# Patient Record
Sex: Female | Born: 1955 | Race: White | Hispanic: No | Marital: Single | Smoking: Current every day smoker
Health system: Southern US, Community
[De-identification: ages and names within clinical notes are randomized; demographics above are authoritative.]

## PROBLEM LIST (undated history)

## (undated) DIAGNOSIS — Z87442 Personal history of urinary calculi: Secondary | ICD-10-CM

## (undated) DIAGNOSIS — E119 Type 2 diabetes mellitus without complications: Secondary | ICD-10-CM

## (undated) DIAGNOSIS — T8859XA Other complications of anesthesia, initial encounter: Secondary | ICD-10-CM

## (undated) DIAGNOSIS — R7303 Prediabetes: Secondary | ICD-10-CM

## (undated) DIAGNOSIS — T4145XA Adverse effect of unspecified anesthetic, initial encounter: Secondary | ICD-10-CM

## (undated) DIAGNOSIS — S82899A Other fracture of unspecified lower leg, initial encounter for closed fracture: Secondary | ICD-10-CM

## (undated) DIAGNOSIS — R519 Headache, unspecified: Secondary | ICD-10-CM

## (undated) DIAGNOSIS — E039 Hypothyroidism, unspecified: Secondary | ICD-10-CM

## (undated) DIAGNOSIS — R51 Headache: Secondary | ICD-10-CM

## (undated) DIAGNOSIS — K5790 Diverticulosis of intestine, part unspecified, without perforation or abscess without bleeding: Secondary | ICD-10-CM

## (undated) HISTORY — PX: TONSILLECTOMY: SUR1361

## (undated) HISTORY — PX: CYST REMOVAL NECK: SHX6281

## (undated) HISTORY — PX: DIAGNOSTIC LAPAROSCOPY: SUR761

---

## 2001-07-12 ENCOUNTER — Emergency Department (HOSPITAL_COMMUNITY): Admission: EM | Admit: 2001-07-12 | Discharge: 2001-07-12 | Payer: Self-pay | Admitting: Emergency Medicine

## 2004-01-14 ENCOUNTER — Ambulatory Visit (HOSPITAL_COMMUNITY): Admission: RE | Admit: 2004-01-14 | Discharge: 2004-01-14 | Payer: Self-pay | Admitting: Family Medicine

## 2008-03-21 ENCOUNTER — Emergency Department (HOSPITAL_COMMUNITY): Admission: EM | Admit: 2008-03-21 | Discharge: 2008-03-21 | Payer: Self-pay | Admitting: Emergency Medicine

## 2010-08-01 ENCOUNTER — Emergency Department (HOSPITAL_COMMUNITY)
Admission: EM | Admit: 2010-08-01 | Discharge: 2010-08-01 | Disposition: A | Discharge status: Home / Self Care | Payer: No Typology Code available for payment source | Attending: Emergency Medicine | Admitting: Emergency Medicine

## 2010-08-01 ENCOUNTER — Emergency Department (HOSPITAL_COMMUNITY): Payer: Self-pay

## 2010-08-01 ENCOUNTER — Emergency Department (HOSPITAL_COMMUNITY): Payer: No Typology Code available for payment source

## 2010-08-01 DIAGNOSIS — IMO0002 Reserved for concepts with insufficient information to code with codable children: Secondary | ICD-10-CM | POA: Insufficient documentation

## 2010-08-01 DIAGNOSIS — M25539 Pain in unspecified wrist: Secondary | ICD-10-CM | POA: Insufficient documentation

## 2010-08-01 DIAGNOSIS — S8000XA Contusion of unspecified knee, initial encounter: Secondary | ICD-10-CM | POA: Insufficient documentation

## 2010-08-01 DIAGNOSIS — M25569 Pain in unspecified knee: Secondary | ICD-10-CM | POA: Insufficient documentation

## 2010-08-01 DIAGNOSIS — S62319A Displaced fracture of base of unspecified metacarpal bone, initial encounter for closed fracture: Secondary | ICD-10-CM | POA: Insufficient documentation

## 2010-08-01 DIAGNOSIS — M79609 Pain in unspecified limb: Secondary | ICD-10-CM | POA: Insufficient documentation

## 2010-08-01 DIAGNOSIS — R51 Headache: Secondary | ICD-10-CM | POA: Insufficient documentation

## 2010-08-23 ENCOUNTER — Emergency Department (HOSPITAL_COMMUNITY): Payer: No Typology Code available for payment source

## 2010-08-23 ENCOUNTER — Emergency Department (HOSPITAL_COMMUNITY)
Admission: EM | Admit: 2010-08-23 | Discharge: 2010-08-23 | Disposition: A | Discharge status: Home / Self Care | Payer: No Typology Code available for payment source | Attending: Emergency Medicine | Admitting: Emergency Medicine

## 2010-08-23 DIAGNOSIS — M79609 Pain in unspecified limb: Secondary | ICD-10-CM | POA: Insufficient documentation

## 2016-02-18 ENCOUNTER — Encounter (HOSPITAL_COMMUNITY): Payer: Self-pay | Admitting: *Deleted

## 2016-02-18 ENCOUNTER — Emergency Department (HOSPITAL_COMMUNITY): Payer: Self-pay

## 2016-02-18 ENCOUNTER — Emergency Department (HOSPITAL_COMMUNITY)
Admission: EM | Admit: 2016-02-18 | Discharge: 2016-02-18 | Disposition: A | Discharge status: Home / Self Care | Payer: Self-pay | Attending: Emergency Medicine | Admitting: Emergency Medicine

## 2016-02-18 DIAGNOSIS — Y9301 Activity, walking, marching and hiking: Secondary | ICD-10-CM | POA: Insufficient documentation

## 2016-02-18 DIAGNOSIS — Y999 Unspecified external cause status: Secondary | ICD-10-CM | POA: Insufficient documentation

## 2016-02-18 DIAGNOSIS — S82899A Other fracture of unspecified lower leg, initial encounter for closed fracture: Secondary | ICD-10-CM

## 2016-02-18 DIAGNOSIS — Y92000 Kitchen of unspecified non-institutional (private) residence as  the place of occurrence of the external cause: Secondary | ICD-10-CM | POA: Insufficient documentation

## 2016-02-18 DIAGNOSIS — S82402A Unspecified fracture of shaft of left fibula, initial encounter for closed fracture: Secondary | ICD-10-CM

## 2016-02-18 DIAGNOSIS — S82432A Displaced oblique fracture of shaft of left fibula, initial encounter for closed fracture: Secondary | ICD-10-CM | POA: Insufficient documentation

## 2016-02-18 DIAGNOSIS — X501XXA Overexertion from prolonged static or awkward postures, initial encounter: Secondary | ICD-10-CM | POA: Insufficient documentation

## 2016-02-18 HISTORY — DX: Personal history of urinary calculi: Z87.442

## 2016-02-18 HISTORY — DX: Other fracture of unspecified lower leg, initial encounter for closed fracture: S82.899A

## 2016-02-18 HISTORY — DX: Diverticulosis of intestine, part unspecified, without perforation or abscess without bleeding: K57.90

## 2016-02-18 HISTORY — DX: Hypothyroidism, unspecified: E03.9

## 2016-02-18 HISTORY — DX: Prediabetes: R73.03

## 2016-02-18 MED ORDER — IBUPROFEN 600 MG PO TABS: 600.0000 mg | ORAL_TABLET | Freq: Four times a day (QID) | ORAL | 0 refills | Status: DC

## 2016-02-18 MED ORDER — HYDROCODONE-ACETAMINOPHEN 7.5-325 MG PO TABS: 1.0000 | ORAL_TABLET | ORAL | 0 refills | Status: DC | PRN

## 2016-02-18 MED ORDER — HYDROCODONE-ACETAMINOPHEN 5-325 MG PO TABS
2.0000 | ORAL_TABLET | Freq: Once | ORAL | Status: AC
  Administered 2016-02-18: 2 via ORAL
  Filled 2016-02-18: qty 2

## 2016-02-18 MED ORDER — ONDANSETRON HCL 4 MG PO TABS
4.0000 mg | ORAL_TABLET | Freq: Once | ORAL | Status: AC
Start: 2016-02-18 — End: 2016-02-18
  Administered 2016-02-18: 4 mg via ORAL
  Filled 2016-02-18: qty 1

## 2016-02-18 NOTE — Discharge Instructions (Signed)
You have a fracture of the fibula on the left. Please keep your foot elevated above your waist. Use your Cam Walker until seen by Dr. Romeo AppleHarrison, or the orthopedic specialist of your choice. Please apply ice when possible. Use your crutches. Please use 600 mg of ibuprofen with breakfast, lunch, dinner, and at bedtime. Please use Norco for more severe pain.This medication may cause drowsiness. Please do not drink, drive, or participate in activity that requires concentration while taking this medication.

## 2016-02-18 NOTE — ED Triage Notes (Signed)
Pt got up from her couch and started walking to the kitchen and then fell down face first onto the floor. Pt denies dizziness, LOC, or hitting her head upon fall. Pt c/o left ankle pain and left knee pain. Pt reports she hears a "popping sound" in her left knee when she ambulates. Pt is able to limp around on left ankle, but with much pain.

## 2016-02-18 NOTE — ED Provider Notes (Signed)
[facility] DEPT Provider Note   CSN: [id number] Arrival date & time: 02/18/16  0911     History   Chief Complaint Chief Complaint  Patient presents with  . Ankle Pain    HPI Maria Leblanc is a 60 y.o. female.  Patient is a 60 year old female who presents to the emergency department with a complaint of knee and ankle pain following a fall.  The patient states that she had gotten up from her couch and walked to her kitchen area. Patient is unsure if she tripped over an object in the floor. She denies dizziness, unusual headache, visual changes,or other problems. She states she fell and injured the left knee and ankle. She has pain with attempting to apply weight. No hx of previous injury or operation to the left knee and ankle.   The history is provided by the patient.  Ankle Pain   The incident occurred 1 to 2 hours ago.    Past Medical History:  Diagnosis Date  . Diverticulosis   . History of kidney stones     There are no active problems to display for this patient.   No past surgical history on file.  OB History    No data available       Home Medications    Prior to Admission medications   Not on File    Family History No family history on file.  Social History Social History  Substance Use Topics  . Smoking status: Not on file  . Smokeless tobacco: Not on file  . Alcohol use Not on file     Allergies   Review of patient's allergies indicates not on file.   Review of Systems Review of Systems  Constitutional: Negative for activity change.       All ROS Neg except as noted in HPI  HENT: Negative for nosebleeds.   Eyes: Negative for photophobia and discharge.  Respiratory: Negative for cough, shortness of breath and wheezing.   Cardiovascular: Negative for chest pain and palpitations.  Gastrointestinal: Negative for abdominal pain and blood in stool.  Genitourinary: Negative for dysuria, frequency and hematuria.  Musculoskeletal:  Positive for arthralgias. Negative for back pain and neck pain.  Skin: Negative.   Neurological: Negative for dizziness, seizures, speech difficulty and light-headedness.  Psychiatric/Behavioral: Negative for confusion and hallucinations.     Physical Exam Updated Vital Signs BP 126/90   Pulse 81   Temp 97.3 F (36.3 C) (Oral)   Resp 18   Ht 5' 5.5" (1.664 m)   Wt 79.8 kg   SpO2 96%   BMI 28.84 kg/m   Physical Exam  Constitutional: Vital signs are normal. She appears well-developed and well-nourished. She is active.  HENT:  Head: Normocephalic and atraumatic.  Right Ear: Tympanic membrane, external ear and ear canal normal.  Left Ear: Tympanic membrane, external ear and ear canal normal.  Nose: Nose normal.  Mouth/Throat: Uvula is midline, oropharynx is clear and moist and mucous membranes are normal.  Eyes: Conjunctivae, EOM and lids are normal. Pupils are equal, round, and reactive to light.  Neck: Trachea normal, normal range of motion and phonation normal. Neck supple. Carotid bruit is not present.  Cardiovascular: Normal rate, regular rhythm and normal pulses.   Abdominal: Soft. Normal appearance and bowel sounds are normal.  Musculoskeletal: She exhibits tenderness.  There is full ROm of right and left hips. No shortening of the lower extremities. Pain with attempted ROM of the left knee, especially at the anterior-medial  aspect of the knee. There is pain to palpation of the lateral malleolus of the left ankle. DP pulse 2+.  Lymphadenopathy:       Head (right side): No submental, no preauricular and no posterior auricular adenopathy present.       Head (left side): No submental, no preauricular and no posterior auricular adenopathy present.    She has no cervical adenopathy.  Neurological: She is alert. She has normal strength. No cranial nerve deficit or sensory deficit. GCS eye subscore is 4. GCS verbal subscore is 5. GCS motor subscore is 6.  Skin: Skin is warm and  dry.  Psychiatric: Her speech is normal.     ED Treatments / Results  Labs (all labs ordered are listed, but only abnormal results are displayed) Labs Reviewed - No data to display  EKG  EKG Interpretation None       Radiology No results found.  Procedures Procedures (including critical care time) FRACTURE CARE LEFT ANKLE. Patient fell earlier today and sustained a fracture of the left fibula. I discussed and demonstrated the fracture with the patient in terms and pictures she would understand. I discussed the procedure with patient. The patient gives permission for the application of a Cam Walker.  Patient identified by arm band. Patient fitted with a Cam Walker and crutches. Patient tolerated the procedure without problem. Use of crutches was demonstrated to the patient by the nursing staff. No neurovascular deficits following the application of the Cam Walker. Medications Ordered in ED Medications  HYDROcodone-acetaminophen (NORCO/VICODIN) 5-325 MG per tablet 2 tablet (not administered)  ondansetron (ZOFRAN) tablet 4 mg (not administered)     Initial Impression / Assessment and Plan / ED Course  I have reviewed the triage vital signs and the nursing notes.  Pertinent labs & imaging results that were available during my care of the patient were reviewed by me and considered in my medical decision making (see chart for details).  Clinical Course    **I have reviewed nursing notes, vital signs, and all appropriate lab and imaging results for this patient.*  Final Clinical Impressions(s) / ED Diagnoses  Patient sustained a fracture of the left fibula. Patient fitted with a Cam Walker. X-ray of the left knee reveals arthritis changes. I discussed this with the patient. The patient will be placed on ibuprofen 600 mg 4 times a day, and Norco for more severe pain. Patient will follow-up with Dr. Romeo AppleHarrison concerning her fracture.    Final diagnoses:  None    New  Prescriptions New Prescriptions   No medications on file     Ivery QualeHobson Syon Tews, PA-C 02/18/16 1129    Glynn OctaveStephen Rancour, MD 02/18/16 [phone]

## 2016-02-21 ENCOUNTER — Encounter (HOSPITAL_BASED_OUTPATIENT_CLINIC_OR_DEPARTMENT_OTHER): Payer: Self-pay | Admitting: *Deleted

## 2016-02-24 ENCOUNTER — Encounter (HOSPITAL_BASED_OUTPATIENT_CLINIC_OR_DEPARTMENT_OTHER): Payer: Self-pay | Admitting: *Deleted

## 2016-02-24 NOTE — H&P (Signed)
[facility] [address]   [address] [place], [place] [id number] [phone] A Division of [facility]  RE: Maria DuffMARTIN, Glennis   [phone]   10/08/2055  02-19-16 SUBJECTIVE: [name] is a 60 year-old female, new patient to me who comes to the office with concerns about her left ankle and left knee.  She fell yesterday, falling forward, striking her knee on the ground and twisting her left ankle.  She was seen in the emergency room [facility].  X-rays of the knee were unremarkable.  She was found to have a fibular fracture.  Given crutches and a CAM walker.  She comes in today for follow up.  No previous problems with her knee or ankle.  Her health upon review shows she has no allergies.  No history of diabetes.  Unremarkable past medical history.  On no chronic medications.  She has recently lost weight on a diet and subsequently is no longer on antihypertensive medication with a history of underlying hypertension.  No history of coronary artery disease or peripheral vascular disease.  Unremarkable past surgical history.  Family history is significant for diabetes and hypertension.  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    OBJECTIVE: Height: 5?5.  Weight: 186 pounds.  Difficult time bearing weight to the left lower extremity with significant pain.  Exam of the left knee shows global peripatellar tenderness with no effusion.  No abrasion.  Stable knee.  Exam of the left ankle shows global soft tissue swelling with tenderness over the distal fibula.  Palpable crepitation.  Achilles is intact.  She is neurovascularly intact distally.    X-RAYS: Plain films of the knee, upon review, are unremarkable.  Three view plain radiographs of the ankle, upon review, show a displaced distal fibula fracture.   ASSESSMENT: 1. Left distal fibula fracture. 2. Left knee contusion.   PLAN: Lengthy discussion with [name]  as to the nature and gravity of her left ankle injury.  Discussed the risks, benefits, potential complications, post-op care, recovery and rehab all discussed at length with regards to ORIF of the left ankle to restore anatomic alignment.  She agrees and understands.  Continue in the CAM walker non-weight bearing.  Intermittent Hydrocodone for pain, as prescribed in the emergency room.  She is out of work for 8-12 weeks.  She understands.  Surgical consultation with Dr. Renaye Rakersim Murphy today.  Post-op follow up with Dr. Eulah PontMurphy.    Estell HarpinJames S. Kramer, M.D.  Electronically verified by Estell HarpinJames S. Kramer, M.D. JSK:jjh D 02-19-16 T 02-20-16

## 2016-02-28 ENCOUNTER — Ambulatory Visit (HOSPITAL_BASED_OUTPATIENT_CLINIC_OR_DEPARTMENT_OTHER): Payer: Self-pay | Admitting: Anesthesiology

## 2016-02-28 ENCOUNTER — Encounter (HOSPITAL_BASED_OUTPATIENT_CLINIC_OR_DEPARTMENT_OTHER)
Admission: RE | Disposition: A | Discharge status: Home / Self Care | Payer: Self-pay | Source: Ambulatory Visit | Attending: Orthopedic Surgery

## 2016-02-28 ENCOUNTER — Encounter (HOSPITAL_BASED_OUTPATIENT_CLINIC_OR_DEPARTMENT_OTHER): Payer: Self-pay | Admitting: Anesthesiology

## 2016-02-28 ENCOUNTER — Ambulatory Visit (HOSPITAL_BASED_OUTPATIENT_CLINIC_OR_DEPARTMENT_OTHER)
Admission: RE | Admit: 2016-02-28 | Discharge: 2016-02-28 | Disposition: A | Discharge status: Home / Self Care | Payer: Self-pay | Source: Ambulatory Visit | Attending: Orthopedic Surgery | Admitting: Orthopedic Surgery

## 2016-02-28 DIAGNOSIS — X501XXA Overexertion from prolonged static or awkward postures, initial encounter: Secondary | ICD-10-CM | POA: Insufficient documentation

## 2016-02-28 DIAGNOSIS — Y9289 Other specified places as the place of occurrence of the external cause: Secondary | ICD-10-CM | POA: Insufficient documentation

## 2016-02-28 DIAGNOSIS — Y998 Other external cause status: Secondary | ICD-10-CM | POA: Insufficient documentation

## 2016-02-28 DIAGNOSIS — S82832A Other fracture of upper and lower end of left fibula, initial encounter for closed fracture: Secondary | ICD-10-CM | POA: Insufficient documentation

## 2016-02-28 DIAGNOSIS — S8002XA Contusion of left knee, initial encounter: Secondary | ICD-10-CM | POA: Insufficient documentation

## 2016-02-28 DIAGNOSIS — Y9301 Activity, walking, marching and hiking: Secondary | ICD-10-CM | POA: Insufficient documentation

## 2016-02-28 DIAGNOSIS — F17200 Nicotine dependence, unspecified, uncomplicated: Secondary | ICD-10-CM | POA: Insufficient documentation

## 2016-02-28 DIAGNOSIS — E039 Hypothyroidism, unspecified: Secondary | ICD-10-CM | POA: Insufficient documentation

## 2016-02-28 HISTORY — PX: ORIF ANKLE FRACTURE: SHX5408

## 2016-02-28 HISTORY — DX: Other fracture of unspecified lower leg, initial encounter for closed fracture: S82.899A

## 2016-02-28 HISTORY — DX: Headache: R51

## 2016-02-28 HISTORY — DX: Headache, unspecified: R51.9

## 2016-02-28 SURGERY — OPEN REDUCTION INTERNAL FIXATION (ORIF) ANKLE FRACTURE
Anesthesia: Regional | Site: Ankle | Laterality: Left

## 2016-02-28 MED ORDER — LIDOCAINE 2% (20 MG/ML) 5 ML SYRINGE
INTRAMUSCULAR | Status: DC | PRN
  Administered 2016-02-28: 50 mg via INTRAVENOUS

## 2016-02-28 MED ORDER — LACTATED RINGERS IV SOLN: INTRAVENOUS | Status: DC

## 2016-02-28 MED ORDER — OMEPRAZOLE 20 MG PO CPDR: 20.0000 mg | DELAYED_RELEASE_CAPSULE | Freq: Every day | ORAL | 2 refills | Status: DC

## 2016-02-28 MED ORDER — LACTATED RINGERS IV SOLN
INTRAVENOUS | Status: DC
Start: 2016-02-28 — End: 2016-02-28
  Administered 2016-02-28: 14:00:00 via INTRAVENOUS

## 2016-02-28 MED ORDER — FENTANYL CITRATE (PF) 100 MCG/2ML IJ SOLN
INTRAMUSCULAR | Status: AC
  Filled 2016-02-28: qty 2

## 2016-02-28 MED ORDER — ONDANSETRON HCL 4 MG/2ML IJ SOLN
INTRAMUSCULAR | Status: DC | PRN
  Administered 2016-02-28: 4 mg via INTRAVENOUS

## 2016-02-28 MED ORDER — GLYCOPYRROLATE 0.2 MG/ML IJ SOLN: 0.2000 mg | Freq: Once | INTRAMUSCULAR | Status: DC | PRN

## 2016-02-28 MED ORDER — ONDANSETRON HCL 4 MG/2ML IJ SOLN
INTRAMUSCULAR | Status: AC
  Filled 2016-02-28: qty 2

## 2016-02-28 MED ORDER — ACETAMINOPHEN 500 MG PO TABS
ORAL_TABLET | ORAL | Status: AC
Start: 2016-02-28 — End: 2016-02-28
  Filled 2016-02-28: qty 2

## 2016-02-28 MED ORDER — SCOPOLAMINE 1 MG/3DAYS TD PT72: 1.0000 | MEDICATED_PATCH | Freq: Once | TRANSDERMAL | Status: DC | PRN

## 2016-02-28 MED ORDER — DEXAMETHASONE SODIUM PHOSPHATE 10 MG/ML IJ SOLN
INTRAMUSCULAR | Status: AC
  Filled 2016-02-28: qty 1

## 2016-02-28 MED ORDER — OXYCODONE-ACETAMINOPHEN 5-325 MG PO TABS: 1.0000 | ORAL_TABLET | ORAL | 0 refills | Status: DC | PRN

## 2016-02-28 MED ORDER — PROPOFOL 10 MG/ML IV BOLUS
INTRAVENOUS | Status: DC | PRN
  Administered 2016-02-28: 150 mg via INTRAVENOUS

## 2016-02-28 MED ORDER — ONDANSETRON HCL 4 MG PO TABS: 4.0000 mg | ORAL_TABLET | Freq: Three times a day (TID) | ORAL | 0 refills | Status: DC | PRN

## 2016-02-28 MED ORDER — MIDAZOLAM HCL 2 MG/2ML IJ SOLN
1.0000 mg | INTRAMUSCULAR | Status: DC | PRN
  Administered 2016-02-28: 2 mg via INTRAVENOUS

## 2016-02-28 MED ORDER — BUPIVACAINE-EPINEPHRINE (PF) 0.5% -1:200000 IJ SOLN
INTRAMUSCULAR | Status: DC | PRN
  Administered 2016-02-28: 30 mL via PERINEURAL

## 2016-02-28 MED ORDER — FENTANYL CITRATE (PF) 100 MCG/2ML IJ SOLN
50.0000 ug | INTRAMUSCULAR | Status: DC | PRN
  Administered 2016-02-28: 100 ug via INTRAVENOUS
  Administered 2016-02-28: 25 ug via INTRAVENOUS

## 2016-02-28 MED ORDER — CEFAZOLIN SODIUM-DEXTROSE 2-4 GM/100ML-% IV SOLN
INTRAVENOUS | Status: AC
Start: 2016-02-28 — End: 2016-02-28
  Filled 2016-02-28: qty 100

## 2016-02-28 MED ORDER — DOCUSATE SODIUM 100 MG PO CAPS: 100.0000 mg | ORAL_CAPSULE | Freq: Two times a day (BID) | ORAL | 0 refills | Status: DC

## 2016-02-28 MED ORDER — METHOCARBAMOL 500 MG PO TABS: 500.0000 mg | ORAL_TABLET | Freq: Four times a day (QID) | ORAL | 0 refills | Status: DC | PRN

## 2016-02-28 MED ORDER — BUPIVACAINE HCL (PF) 0.5 % IJ SOLN
INTRAMUSCULAR | Status: DC | PRN
  Administered 2016-02-28: 10 mL

## 2016-02-28 MED ORDER — CEFAZOLIN SODIUM-DEXTROSE 2-4 GM/100ML-% IV SOLN
2.0000 g | INTRAVENOUS | Status: AC
  Administered 2016-02-28: 2 g via INTRAVENOUS

## 2016-02-28 MED ORDER — DEXAMETHASONE SODIUM PHOSPHATE 4 MG/ML IJ SOLN
INTRAMUSCULAR | Status: DC | PRN
  Administered 2016-02-28: 10 mg via INTRAVENOUS

## 2016-02-28 MED ORDER — ASPIRIN EC 81 MG PO TBEC: 81.0000 mg | DELAYED_RELEASE_TABLET | Freq: Every day | ORAL | 0 refills | Status: DC

## 2016-02-28 MED ORDER — ACETAMINOPHEN 500 MG PO TABS
1000.0000 mg | ORAL_TABLET | Freq: Once | ORAL | Status: AC
  Administered 2016-02-28: 1000 mg via ORAL

## 2016-02-28 MED ORDER — HYDROMORPHONE HCL 1 MG/ML IJ SOLN: 0.2500 mg | INTRAMUSCULAR | Status: DC | PRN

## 2016-02-28 MED ORDER — LIDOCAINE 2% (20 MG/ML) 5 ML SYRINGE
INTRAMUSCULAR | Status: AC
  Filled 2016-02-28: qty 5

## 2016-02-28 MED ORDER — CHLORHEXIDINE GLUCONATE 4 % EX LIQD: 60.0000 mL | Freq: Once | CUTANEOUS | Status: DC

## 2016-02-28 MED ORDER — MIDAZOLAM HCL 2 MG/2ML IJ SOLN
INTRAMUSCULAR | Status: AC
  Filled 2016-02-28: qty 2

## 2016-02-28 SURGICAL SUPPLY — 81 items
APL SKNCLS STERI-STRIP NONHPOA (GAUZE/BANDAGES/DRESSINGS) ×1
BANDAGE ACE 4X5 VEL STRL LF (GAUZE/BANDAGES/DRESSINGS) ×3 IMPLANT
BANDAGE ACE 6X5 VEL STRL LF (GAUZE/BANDAGES/DRESSINGS) ×3 IMPLANT
BANDAGE ESMARK 6X9 LF (GAUZE/BANDAGES/DRESSINGS) ×1 IMPLANT
BENZOIN TINCTURE PRP APPL 2/3 (GAUZE/BANDAGES/DRESSINGS) ×2 IMPLANT
BIT DRILL 2.5X125 (BIT) ×2 IMPLANT
BIT DRILL 3.5X125 (BIT) IMPLANT
BIT DRILL COUNTER SINK (DRILL) IMPLANT
BLADE SURG 15 STRL LF DISP TIS (BLADE) ×2 IMPLANT
BLADE SURG 15 STRL SS (BLADE) ×6
BNDG CMPR 9X6 STRL LF SNTH (GAUZE/BANDAGES/DRESSINGS) ×1
BNDG COHESIVE 4X5 TAN STRL (GAUZE/BANDAGES/DRESSINGS) ×3 IMPLANT
BNDG ESMARK 6X9 LF (GAUZE/BANDAGES/DRESSINGS) ×3
CHLORAPREP W/TINT 26ML (MISCELLANEOUS) ×3 IMPLANT
CLOSURE STERI-STRIP 1/2X4 (GAUZE/BANDAGES/DRESSINGS) ×1
CLSR STERI-STRIP ANTIMIC 1/2X4 (GAUZE/BANDAGES/DRESSINGS) ×2 IMPLANT
COVER BACK TABLE 60X90IN (DRAPES) ×3 IMPLANT
CUFF TOURNIQUET SINGLE 24IN (TOURNIQUET CUFF) IMPLANT
CUFF TOURNIQUET SINGLE 34IN LL (TOURNIQUET CUFF) ×2 IMPLANT
DECANTER SPIKE VIAL GLASS SM (MISCELLANEOUS) IMPLANT
DRAPE EXTREMITY T 121X128X90 (DRAPE) ×3 IMPLANT
DRAPE IMP U-DRAPE 54X76 (DRAPES) ×3 IMPLANT
DRAPE OEC MINIVIEW 54X84 (DRAPES) ×3 IMPLANT
DRAPE U-SHAPE 47X51 STRL (DRAPES) ×3 IMPLANT
DRILL BIT 3.5X125 (BIT) ×3
DRILL COUNTER SINK (DRILL) ×3
DRSG EMULSION OIL 3X3 NADH (GAUZE/BANDAGES/DRESSINGS) ×3 IMPLANT
DRSG PAD ABDOMINAL 8X10 ST (GAUZE/BANDAGES/DRESSINGS) ×3 IMPLANT
ELECT REM PT RETURN 9FT ADLT (ELECTROSURGICAL) ×3
ELECTRODE REM PT RTRN 9FT ADLT (ELECTROSURGICAL) ×1 IMPLANT
GAUZE SPONGE 4X4 12PLY STRL (GAUZE/BANDAGES/DRESSINGS) ×3 IMPLANT
GLOVE BIO SURGEON STRL SZ 6.5 (GLOVE) ×1 IMPLANT
GLOVE BIO SURGEON STRL SZ7.5 (GLOVE) ×6 IMPLANT
GLOVE BIO SURGEONS STRL SZ 6.5 (GLOVE) ×1
GLOVE BIOGEL PI IND STRL 7.0 (GLOVE) IMPLANT
GLOVE BIOGEL PI IND STRL 8 (GLOVE) ×2 IMPLANT
GLOVE BIOGEL PI INDICATOR 7.0 (GLOVE) ×6
GLOVE BIOGEL PI INDICATOR 8 (GLOVE) ×4
GOWN STRL REUS W/ TWL LRG LVL3 (GOWN DISPOSABLE) ×2 IMPLANT
GOWN STRL REUS W/ TWL XL LVL3 (GOWN DISPOSABLE) ×1 IMPLANT
GOWN STRL REUS W/TWL LRG LVL3 (GOWN DISPOSABLE) ×6
GOWN STRL REUS W/TWL XL LVL3 (GOWN DISPOSABLE) ×3
NEEDLE HYPO 22GX1.5 SAFETY (NEEDLE) IMPLANT
NS IRRIG 1000ML POUR BTL (IV SOLUTION) ×3 IMPLANT
PACK BASIN DAY SURGERY FS (CUSTOM PROCEDURE TRAY) ×3 IMPLANT
PAD CAST 4YDX4 CTTN HI CHSV (CAST SUPPLIES) ×1 IMPLANT
PADDING CAST ABS 4INX4YD NS (CAST SUPPLIES) ×4
PADDING CAST ABS COTTON 4X4 ST (CAST SUPPLIES) ×2 IMPLANT
PADDING CAST COTTON 4X4 STRL (CAST SUPPLIES) ×3
PADDING CAST COTTON 6X4 STRL (CAST SUPPLIES) ×3 IMPLANT
PENCIL BUTTON HOLSTER BLD 10FT (ELECTRODE) ×3 IMPLANT
PLATE 1/3 TUBULAR 7H (Plate) ×2 IMPLANT
SCREW CANC 2.5XFT HEX12X4X (Screw) IMPLANT
SCREW CANC FT 16X4X2.5XHEX (Screw) IMPLANT
SCREW CANCELLOUR THREAD 45X4.0 (Screw) ×2 IMPLANT
SCREW CANCELLOUS 4.0X12MM (Screw) ×3 IMPLANT
SCREW CANCELLOUS 4.0X16MM (Screw) ×3 IMPLANT
SCREW CORTEX ST MATTA 3.5X12MM (Screw) ×6 IMPLANT
SCREW CORTEX ST MATTA 3.5X16MM (Screw) ×2 IMPLANT
SLEEVE SCD COMPRESS KNEE MED (MISCELLANEOUS) IMPLANT
SPLINT FAST PLASTER 5X30 (CAST SUPPLIES) ×2
SPLINT PLASTER CAST FAST 5X30 (CAST SUPPLIES) ×20 IMPLANT
SPONGE LAP 4X18 X RAY DECT (DISPOSABLE) ×3 IMPLANT
SUCTION FRAZIER HANDLE 10FR (MISCELLANEOUS) ×2
SUCTION TUBE FRAZIER 10FR DISP (MISCELLANEOUS) ×1 IMPLANT
SUT ETHILON 3 0 PS 1 (SUTURE) IMPLANT
SUT MNCRL AB 4-0 PS2 18 (SUTURE) IMPLANT
SUT MON AB 2-0 CT1 36 (SUTURE) IMPLANT
SUT MON AB 3-0 SH 27 (SUTURE)
SUT MON AB 3-0 SH27 (SUTURE) IMPLANT
SUT VIC AB 0 SH 27 (SUTURE) ×3 IMPLANT
SUT VIC AB 2-0 SH 27 (SUTURE)
SUT VIC AB 2-0 SH 27XBRD (SUTURE) IMPLANT
SYR BULB 3OZ (MISCELLANEOUS) ×3 IMPLANT
SYR CONTROL 10ML LL (SYRINGE) IMPLANT
TAPE STRIPS DRAPE STRL (GAUZE/BANDAGES/DRESSINGS) ×2 IMPLANT
TOWEL OR 17X24 6PK STRL BLUE (TOWEL DISPOSABLE) ×6 IMPLANT
TOWEL OR NON WOVEN STRL DISP B (DISPOSABLE) ×3 IMPLANT
TUBE CONNECTING 20'X1/4 (TUBING) ×1
TUBE CONNECTING 20X1/4 (TUBING) ×2 IMPLANT
UNDERPAD 30X30 (UNDERPADS AND DIAPERS) ×3 IMPLANT

## 2016-02-28 NOTE — Anesthesia Postprocedure Evaluation (Signed)
Anesthesia Post Note  Patient: Ray ChurchSarah J Fletchall  Procedure(s) Performed: Procedure(s) (LRB): OPEN REDUCTION INTERNAL FIXATION (ORIF) ANKLE FRACTURE, LEFT (Left)  Patient location during evaluation: PACU Anesthesia Type: General and Regional Level of consciousness: awake and alert Pain management: pain level controlled Vital Signs Assessment: post-procedure vital signs reviewed and stable Respiratory status: spontaneous breathing, nonlabored ventilation and respiratory function stable Cardiovascular status: blood pressure returned to baseline and stable Postop Assessment: no signs of nausea or vomiting Anesthetic complications: no    Last Vitals:  Vitals:   02/28/16 1615 02/28/16 1630  BP: 133/80 123/73  Pulse: 76 75  Resp: 16 18  Temp:      Last Pain:  Vitals:   02/28/16 1630  TempSrc:   PainSc: 0-No pain    LLE Motor Response: No movement due to regional block (02/28/16 1630) LLE Sensation: Numbness (02/28/16 1630)          Tarrin Menn,W. EDMOND

## 2016-02-28 NOTE — Discharge Instructions (Signed)
Elevate leg as much as possible to reduce pain and swelling.  Diet: As you were doing prior to hospitalization   Shower:  You have a splint on, leave the splint in place and keep the splint dry with a plastic bag.  Dressing:  You have a splint, just leave the splint in place and we will change your bandages during your first follow-up appointment.    Activity:  Increase activity slowly as tolerated, but follow the weight bearing instructions below.  The rules on driving is that you can not be taking narcotics while you drive, and you must feel in control of the vehicle.    Weight Bearing:  Non weight bearing left leg   To prevent constipation: you may use a stool softener such as -  Colace (over the counter) 100 mg by mouth twice a day  Drink plenty of fluids (prune juice may be helpful) and high fiber foods Miralax (over the counter) for constipation as needed.    Itching:  If you experience itching with your medications, try taking only a single pain pill, or even half a pain pill at a time.  You may take up to 10 pain pills per day, and you can also use benadryl over the counter for itching or also to help with sleep.   Precautions:  If you experience chest pain or shortness of breath - call 911 immediately for transfer to the hospital emergency department!!  If you develop a fever greater that 101 F, purulent drainage from wound, increased redness or drainage from wound, or calf pain -- Call the office at [phone]                                                Follow- Up Appointment:  Please call for an appointment to be seen in 2 weeks [place] - [phone]     Post Anesthesia Home Care Instructions  Activity: Get plenty of rest for the remainder of the day. A responsible adult should stay with you for 24 hours following the procedure.  For the next 24 hours, DO NOT: -Drive a car -Advertising copywriterperate machinery -Drink alcoholic beverages -Take any medication unless instructed by  your physician -Make any legal decisions or sign important papers.  Meals: Start with liquid foods such as gelatin or soup. Progress to regular foods as tolerated. Avoid greasy, spicy, heavy foods. If nausea and/or vomiting occur, drink only clear liquids until the nausea and/or vomiting subsides. Call your physician if vomiting continues.  Special Instructions/Symptoms: Your throat may feel dry or sore from the anesthesia or the breathing tube placed in your throat during surgery. If this causes discomfort, gargle with warm salt water. The discomfort should disappear within 24 hours.  If you had a scopolamine patch placed behind your ear for the management of post- operative nausea and/or vomiting:  1. The medication in the patch is effective for 72 hours, after which it should be removed.  Wrap patch in a tissue and discard in the trash. Wash hands thoroughly with soap and water. 2. You may remove the patch earlier than 72 hours if you experience unpleasant side effects which may include dry mouth, dizziness or visual disturbances. 3. Avoid touching the patch. Wash your hands with soap and water after contact with the patch.   Regional Anesthesia Blocks  1. Numbness or the  inability to move the "blocked" extremity may last from 3-48 hours after placement. The length of time depends on the medication injected and your individual response to the medication. If the numbness is not going away after 48 hours, call your surgeon.  2. The extremity that is blocked will need to be protected until the numbness is gone and the  Strength has returned. Because you cannot feel it, you will need to take extra care to avoid injury. Because it may be weak, you may have difficulty moving it or using it. You may not know what position it is in without looking at it while the block is in effect.  3. For blocks in the legs and feet, returning to weight bearing and walking needs to be done carefully. You will need  to wait until the numbness is entirely gone and the strength has returned. You should be able to move your leg and foot normally before you try and bear weight or walk. You will need someone to be with you when you first try to ensure you do not fall and possibly risk injury.  4. Bruising and tenderness at the needle site are common side effects and will resolve in a few days.  5. Persistent numbness or new problems with movement should be communicated to the surgeon or the [facility] [phone] [facility], [facility] [phone]).   Post Anesthesia Home Care Instructions  Activity: Get plenty of rest for the remainder of the day. A responsible adult should stay with you for 24 hours following the procedure.  For the next 24 hours, DO NOT: -Drive a car -Advertising copywriter -Drink alcoholic beverages -Take any medication unless instructed by your physician -Make any legal decisions or sign important papers.  Meals: Start with liquid foods such as gelatin or soup. Progress to regular foods as tolerated. Avoid greasy, spicy, heavy foods. If nausea and/or vomiting occur, drink only clear liquids until the nausea and/or vomiting subsides. Call your physician if vomiting continues.  Special Instructions/Symptoms: Your throat may feel dry or sore from the anesthesia or the breathing tube placed in your throat during surgery. If this causes discomfort, gargle with warm salt water. The discomfort should disappear within 24 hours.  If you had a scopolamine patch placed behind your ear for the management of post- operative nausea and/or vomiting:  1. The medication in the patch is effective for 72 hours, after which it should be removed.  Wrap patch in a tissue and discard in the trash. Wash hands thoroughly with soap and water. 2. You may remove the patch earlier than 72 hours if you experience unpleasant side effects which may include dry mouth, dizziness or visual  disturbances. 3. Avoid touching the patch. Wash your hands with soap and water after contact with the patch.   Regional Anesthesia Blocks  1. Numbness or the inability to move the "blocked" extremity may last from 3-48 hours after placement. The length of time depends on the medication injected and your individual response to the medication. If the numbness is not going away after 48 hours, call your surgeon.  2. The extremity that is blocked will need to be protected until the numbness is gone and the  Strength has returned. Because you cannot feel it, you will need to take extra care to avoid injury. Because it may be weak, you may have difficulty moving it or using it. You may not know what position it is in without looking at it  while the block is in effect.  3. For blocks in the legs and feet, returning to weight bearing and walking needs to be done carefully. You will need to wait until the numbness is entirely gone and the strength has returned. You should be able to move your leg and foot normally before you try and bear weight or walk. You will need someone to be with you when you first try to ensure you do not fall and possibly risk injury.  4. Bruising and tenderness at the needle site are common side effects and will resolve in a few days.  5. Persistent numbness or new problems with movement should be communicated to the surgeon or the [facility] [phone])/ [facility] [phone]).

## 2016-02-28 NOTE — Op Note (Signed)
02/28/2016  3:16 PM  PATIENT:  Maria ChurchSarah J Leblanc    PRE-OPERATIVE DIAGNOSIS:  fracture left ankle  POST-OPERATIVE DIAGNOSIS:  Same  PROCEDURE:  OPEN REDUCTION INTERNAL FIXATION (ORIF) ANKLE FRACTURE, left  SURGEON:  Paulanthony Gleaves, Jewel BaizeIMOTHY D, MD  ASSISTANT: Aquilla HackerHenry Martensen, PA-C, he was present and scrubbed throughout the case, critical for completion in a timely fashion, and for retraction, instrumentation, and closure.   ANESTHESIA:   gen  PREOPERATIVE INDICATIONS:  Maria ChurchSarah J Leblanc is a  60 y.o. female with a diagnosis of fracture left ankle who failed conservative measures and elected for surgical management.    The risks benefits and alternatives were discussed with the patient preoperatively including but not limited to the risks of infection, bleeding, nerve injury, cardiopulmonary complications, the need for revision surgery, among others, and the patient was willing to proceed.  OPERATIVE IMPLANTS: stryker 1/3 tubular plate  OPERATIVE FINDINGS: Unstable ankle fracture. Stable syndesmosis post op  BLOOD LOSS: min  COMPLICATIONS: none  TOURNIQUET TIME: 30min  OPERATIVE PROCEDURE:  Patient was identified in the preoperative holding area and site was marked by me He was transported to the operating theater and placed on the table in supine position taking care to pad all bony prominences. After a preincinduction time out anesthesia was induced. The left lower lower extremity was prepped and draped in normal sterile fashion and a pre-incision timeout was performed. Maria ChurchSarah J Leblanc received ancef for preoperative antibiotics.   I made a lateral incision of roughly 7 cm dissection was carried down sharply to the distal fibula and then spreading dissection was used proximally to protect the superficial peroneal nerve. I sharply incised the periosteum and took care to protect the peroneal tendons. I then debrided the fracture site and performed a reduction maneuver which was held in place with a  clamp.   I placed a lag screw across the fracture  I then selected a 7-hole one third tubular plate and placed in a neutralization fashion care was taken distally so as not to penetrate the joint with the cancellus screws.  I then stressed the syndesmosis and it was unstable,  for syndesmotic fixation I performed a reduction maneuver with a clamp and placed a screw  The wound was then thoroughly irrigated and closed using a 0 Vicryl and absorbable Monocryl sutures. He was placed in a short leg splint.   POST OPERATIVE PLAN: Non-weightbearing. DVT prophylaxis will consist of Mobilization

## 2016-02-28 NOTE — Anesthesia Procedure Notes (Signed)
Procedure Name: LMA Insertion Performed by: Austan Nicholl W Pre-anesthesia Checklist: Patient identified, Emergency Drugs available, Suction available and Patient being monitored Patient Re-evaluated:Patient Re-evaluated prior to inductionOxygen Delivery Method: Circle system utilized Preoxygenation: Pre-oxygenation with 100% oxygen Intubation Type: IV induction Ventilation: Mask ventilation without difficulty LMA: LMA inserted LMA Size: 4.0 Number of attempts: 1 Placement Confirmation: positive ETCO2 Tube secured with: Tape Dental Injury: Teeth and Oropharynx as per pre-operative assessment        

## 2016-02-28 NOTE — Anesthesia Procedure Notes (Addendum)
Anesthesia Regional Block:  Popliteal block  Pre-Anesthetic Checklist: ,, timeout performed, Correct Patient, Correct Site, Correct Laterality, Correct Procedure, Correct Position, site marked, Risks and benefits discussed, pre-op evaluation,  At surgeon's request and post-op pain management  Laterality: Left  Prep: Maximum Sterile Barrier Precautions used, chloraprep       Needles:  Injection technique: Single-shot  Needle Type: Echogenic Stimulator Needle     Needle Length: 9cm 9 cm Needle Gauge: 21 and 21 G    Additional Needles:  Procedures: ultrasound guided (picture in chart) and nerve stimulator Popliteal block  Nerve Stimulator or Paresthesia:  Response: Peroneal,  Response: Tibial,   Additional Responses:   Narrative:  Start time: 02/28/2016 2:05 PM End time: 02/28/2016 2:15 PM Injection made incrementally with aspirations every 5 mL. Anesthesiologist: Gaynelle AduFITZGERALD, Ambriel Gorelick  Additional Notes: 2% Lidocaine skin wheel. Saphenous block with 10cc of 0.5% Bupivicaine plain.

## 2016-02-28 NOTE — Anesthesia Preprocedure Evaluation (Addendum)
Anesthesia Evaluation  Patient identified by MRN, date of birth, ID band Patient awake    Reviewed: Allergy & Precautions, H&P , NPO status , Patient's Chart, lab work & pertinent test results  Airway Mallampati: II  TM Distance: >3 FB Neck ROM: Full    Dental no notable dental hx. (+) Teeth Intact, Dental Advisory Given   Pulmonary Current Smoker,    Pulmonary exam normal breath sounds clear to auscultation       Cardiovascular negative cardio ROS   Rhythm:Regular Rate:Normal     Neuro/Psych  Headaches, negative psych ROS   GI/Hepatic negative GI ROS, Neg liver ROS,   Endo/Other  Hypothyroidism   Renal/GU negative Renal ROS  negative genitourinary   Musculoskeletal   Abdominal   Peds  Hematology negative hematology ROS (+)   Anesthesia Other Findings   Reproductive/Obstetrics negative OB ROS                            Anesthesia Physical Anesthesia Plan  ASA: II  Anesthesia Plan: General and Regional   Post-op Pain Management: GA combined w/ Regional for post-op pain   Induction: Intravenous  Airway Management Planned: LMA  Additional Equipment:   Intra-op Plan:   Post-operative Plan: Extubation in OR  Informed Consent: I have reviewed the patients History and Physical, chart, labs and discussed the procedure including the risks, benefits and alternatives for the proposed anesthesia with the patient or authorized representative who has indicated his/her understanding and acceptance.   Dental advisory given  Plan Discussed with: CRNA  Anesthesia Plan Comments:         Anesthesia Quick Evaluation

## 2016-02-28 NOTE — Interval H&P Note (Signed)
History and Physical Interval Note:  02/28/2016 2:15 PM  Maria ChurchSarah J Leblanc  has presented today for surgery, with the diagnosis of fracture left ankle  The various methods of treatment have been discussed with the patient and family. After consideration of risks, benefits and other options for treatment, the patient has consented to  Procedure(s): OPEN REDUCTION INTERNAL FIXATION (ORIF) ANKLE FRACTURE, left (Left) as a surgical intervention .  The patient's history has been reviewed, patient examined, no change in status, stable for surgery.  I have reviewed the patient's chart and labs.  Questions were answered to the patient's satisfaction.     Patrik Turnbaugh D

## 2016-02-28 NOTE — Transfer of Care (Signed)
Immediate Anesthesia Transfer of Care Note  Patient: Maria ChurchSarah J Leblanc  Procedure(s) Performed: Procedure(s): OPEN REDUCTION INTERNAL FIXATION (ORIF) ANKLE FRACTURE, LEFT (Left)  Patient Location: PACU  Anesthesia Type:General  Level of Consciousness: awake and sedated  Airway & Oxygen Therapy: Patient Spontanous Breathing and Patient connected to face mask oxygen  Post-op Assessment: Report given to RN and Post -op Vital signs reviewed and stable  Post vital signs: Reviewed and stable  Last Vitals:  Vitals:   02/28/16 1417 02/28/16 1418  BP:  119/71  Pulse: 72 71  Resp: 15 17  Temp:      Last Pain:  Vitals:   02/28/16 1343  TempSrc: Oral  PainSc: 8       Patients Stated Pain Goal: 4 (02/28/16 1343)  Complications: No apparent anesthesia complications

## 2016-02-28 NOTE — Progress Notes (Signed)
Assisted Dr. Edmond Fitzgerald with left, ultrasound guided, popliteal/saphenous block. Side rails up, monitors on throughout procedure. See vital signs in flow sheet. Tolerated Procedure well. 

## 2016-03-05 ENCOUNTER — Encounter (HOSPITAL_BASED_OUTPATIENT_CLINIC_OR_DEPARTMENT_OTHER): Payer: Self-pay | Admitting: Orthopedic Surgery

## 2016-07-31 ENCOUNTER — Emergency Department (HOSPITAL_COMMUNITY)
Admission: EM | Admit: 2016-07-31 | Discharge: 2016-07-31 | Disposition: A | Discharge status: Home / Self Care | Payer: Self-pay | Attending: Emergency Medicine | Admitting: Emergency Medicine

## 2016-07-31 ENCOUNTER — Encounter (HOSPITAL_COMMUNITY): Payer: Self-pay | Admitting: Emergency Medicine

## 2016-07-31 ENCOUNTER — Emergency Department (HOSPITAL_COMMUNITY): Payer: Self-pay

## 2016-07-31 DIAGNOSIS — E039 Hypothyroidism, unspecified: Secondary | ICD-10-CM | POA: Insufficient documentation

## 2016-07-31 DIAGNOSIS — E1165 Type 2 diabetes mellitus with hyperglycemia: Secondary | ICD-10-CM | POA: Insufficient documentation

## 2016-07-31 DIAGNOSIS — R739 Hyperglycemia, unspecified: Secondary | ICD-10-CM

## 2016-07-31 DIAGNOSIS — M5412 Radiculopathy, cervical region: Secondary | ICD-10-CM | POA: Insufficient documentation

## 2016-07-31 DIAGNOSIS — F17210 Nicotine dependence, cigarettes, uncomplicated: Secondary | ICD-10-CM | POA: Insufficient documentation

## 2016-07-31 LAB — CBG MONITORING, ED
Glucose-Capillary: 426 mg/dL — ABNORMAL HIGH (ref 65–99)
Glucose-Capillary: 312 mg/dL — ABNORMAL HIGH (ref 65–99)
Glucose-Capillary: 311 mg/dL — ABNORMAL HIGH (ref 65–99)

## 2016-07-31 LAB — BASIC METABOLIC PANEL
Anion gap: 7 (ref 5–15)
BUN: 8 mg/dL (ref 6–20)
CHLORIDE: 99 mmol/L — AB (ref 101–111)
CO2: 26 mmol/L (ref 22–32)
CREATININE: 0.46 mg/dL (ref 0.44–1.00)
Calcium: 9.8 mg/dL (ref 8.9–10.3)
GFR calc non Af Amer: >60 mL/min (ref >60)
Glucose, Bld: 453 mg/dL — ABNORMAL HIGH (ref 65–99)
POTASSIUM: 3.5 mmol/L (ref 3.5–5.1)
Sodium: 132 mmol/L — ABNORMAL LOW (ref 135–145)

## 2016-07-31 MED ORDER — SODIUM CHLORIDE 0.9 % IV BOLUS (SEPSIS)
1000.0000 mL | Freq: Once | INTRAVENOUS | Status: AC
  Administered 2016-07-31: 1000 mL via INTRAVENOUS

## 2016-07-31 MED ORDER — METFORMIN HCL 500 MG PO TABS: 500.0000 mg | ORAL_TABLET | Freq: Two times a day (BID) | ORAL | 1 refills | Status: DC

## 2016-07-31 MED ORDER — METHOCARBAMOL 500 MG PO TABS
500.0000 mg | ORAL_TABLET | Freq: Once | ORAL | Status: AC
  Administered 2016-07-31: 500 mg via ORAL
  Filled 2016-07-31: qty 1

## 2016-07-31 MED ORDER — MORPHINE SULFATE (PF) 4 MG/ML IV SOLN: 4.0000 mg | Freq: Once | INTRAVENOUS | Status: DC

## 2016-07-31 MED ORDER — MORPHINE SULFATE (PF) 2 MG/ML IV SOLN
INTRAVENOUS | Status: AC
  Administered 2016-07-31: 4 mg via INTRAVENOUS
  Filled 2016-07-31: qty 2

## 2016-07-31 MED ORDER — METHOCARBAMOL 500 MG PO TABS
500.0000 mg | ORAL_TABLET | Freq: Two times a day (BID) | ORAL | 0 refills | Status: DC | PRN
Start: 2016-07-31 — End: 2017-03-26

## 2016-07-31 MED ORDER — IBUPROFEN 600 MG PO TABS: 600.0000 mg | ORAL_TABLET | Freq: Four times a day (QID) | ORAL | 0 refills | Status: DC | PRN

## 2016-07-31 MED ORDER — DEXAMETHASONE SODIUM PHOSPHATE 4 MG/ML IJ SOLN
10.0000 mg | Freq: Once | INTRAMUSCULAR | Status: AC
  Administered 2016-07-31: 10 mg via INTRAVENOUS
  Filled 2016-07-31: qty 3

## 2016-07-31 MED ORDER — INSULIN ASPART 100 UNIT/ML IV SOLN
10.0000 [IU] | Freq: Once | INTRAVENOUS | Status: AC
  Administered 2016-07-31: 10 [IU] via INTRAVENOUS

## 2016-07-31 MED ORDER — INSULIN ASPART 100 UNIT/ML ~~LOC~~ SOLN
SUBCUTANEOUS | Status: AC
  Filled 2016-07-31: qty 1

## 2016-07-31 NOTE — ED Notes (Signed)
ED Provider at bedside. 

## 2016-07-31 NOTE — Discharge Instructions (Signed)
Please start back on your Metformin twice daily for your high blood sugar - call your doctor for an appointment on Monday or Tuesday to continue to watch your blood sugar and adjust her medications as needed. Because we gave you a steroid for your neck pain in the emergency department it will mean that her blood sugar will go up and become higher. This may make you feel sick including feelings of nausea, lightheadedness, urinary frequency. If these things occur please return to the emergency department.  He will need to discuss with your orthopedic doctor your neck pain as if this progresses you may develop weakness or numbness of your arm. If you develop severe pain, weakness or numbness of your arm, you must return immediately to the emergency department.  Please obtain all of your results from medical records or have your doctors office obtain the results - share them with your doctor - you should be seen at your doctors office in the next 2 days. Call today to arrange your follow up. Take the medications as prescribed. Please review all of the medicines and only take them if you do not have an allergy to them. Please be aware that if you are taking birth control pills, taking other prescriptions, ESPECIALLY ANTIBIOTICS may make the birth control ineffective - if this is the case, either do not engage in sexual activity or use alternative methods of birth control such as condoms until you have finished the medicine and your family doctor says it is OK to restart them. If you are on a blood thinner such as COUMADIN, be aware that any other medicine that you take may cause the coumadin to either work too much, or not enough - you should have your coumadin level rechecked in next 7 days if this is the case.  ?  It is also a possibility that you have an allergic reaction to any of the medicines that you have been prescribed - Everybody reacts differently to medications and while MOST people have no trouble with  most medicines, you may have a reaction such as nausea, vomiting, rash, swelling, shortness of breath. If this is the case, please stop taking the medicine immediately and contact your physician.  ?  You should return to the ER if you develop severe or worsening symptoms.    Motrin 4 times a day as needed Robaxin twice daily as needed for muscle pain / spasm Metformin twice daily for the high blood sugar. Follow a strict diabetic diet!

## 2016-07-31 NOTE — ED Provider Notes (Signed)
[facility] Provider Note   CSN: [id number] Arrival date & time: 07/31/16  4098     History   Chief Complaint Chief Complaint  Patient presents with  . Arm Pain    HPI Maria Leblanc is a 61 y.o. female.  HPI  The patient is a 61 year old female with a past medical history of hypothyroidism as well as diabetes, the patient reports that she no longer takes her diabetic medications though she states it is because she lost over 100 pounds. She is unaware what her blood sugar is and states that she is supposed to be taking her medications but just has not been doing a good job about taking them. She denies urinary frequency or excessive thirst. She reports that starting 2 days ago she noticed some left-sided arm pain which was located around the elbow, it seems to radiate up into the shoulder and the neck and down into the hand with occasional bouts of numbness into her fingers especially the thumb and the index finger. This has been a persistent pain like a toothache that will not go away, it does seem to get worse with certain positions of the head and the arm. She denies having any focal weakness of her hand. The symptoms are persistent, gradually worsening and becoming more intense, she denies any lower extremity symptoms, visual symptoms or difficulty with speech.  Past Medical History:  Diagnosis Date  . Ankle fracture 02/18/2016   left  . Borderline diabetes   . Diverticulosis   . Headache   . History of kidney stones   . Hypothyroid     There are no active problems to display for this patient.   Past Surgical History:  Procedure Laterality Date  . CYST REMOVAL NECK     x3  . DIAGNOSTIC LAPAROSCOPY     as teenager  . ORIF ANKLE FRACTURE Left 02/28/2016   Procedure: OPEN REDUCTION INTERNAL FIXATION (ORIF) ANKLE FRACTURE, LEFT;  Surgeon: Sheral Apley, MD;  Location: [facility];  Service: Orthopedics;  Laterality: Left;  . TONSILLECTOMY     as  child    OB History    No data available       Home Medications    Prior to Admission medications   Medication Sig Start Date End Date Taking? Authorizing Provider  ibuprofen (ADVIL,MOTRIN) 600 MG tablet Take 1 tablet (600 mg total) by mouth every 6 (six) hours as needed. 07/31/16   Eber Hong, MD  metFORMIN (GLUCOPHAGE) 500 MG tablet Take 1 tablet (500 mg total) by mouth 2 (two) times daily with a meal. 07/31/16   Eber Hong, MD  methocarbamol (ROBAXIN) 500 MG tablet Take 1 tablet (500 mg total) by mouth 2 (two) times daily as needed for muscle spasms. 07/31/16   Eber Hong, MD    Family History No family history on file.  Social History Social History  Substance Use Topics  . Smoking status: Current Every Day Smoker    Packs/day: 0.50    Years: 20.00    Types: Cigarettes  . Smokeless tobacco: Never Used  . Alcohol use Yes     Comment: rare use of liquor 3 times yearly      Allergies   Patient has no known allergies.   Review of Systems Review of Systems  All other systems reviewed and are negative.    Physical Exam Updated Vital Signs BP (!) 153/95   Pulse 73   Temp 97.8 F (36.6 C) (Oral)  Resp 18   Ht  (1.676 m)   Wt 175 lb (79.4 kg)   SpO2 91%   BMI 28.25 kg/m   Physical Exam  Constitutional: She appears well-developed and well-nourished. No distress.  HENT:  Head: Normocephalic and atraumatic.  Mouth/Throat: Oropharynx is clear and moist. No oropharyngeal exudate.  Eyes: Conjunctivae and EOM are normal. Pupils are equal, round, and reactive to light. Right eye exhibits no discharge. Left eye exhibits no discharge. No scleral icterus.  Neck: Normal range of motion. Neck supple. No JVD present. No thyromegaly present.  Cardiovascular: Normal rate, regular rhythm, normal heart sounds and intact distal pulses.  Exam reveals no gallop and no friction rub.   No murmur heard. Pulmonary/Chest: Effort normal and breath sounds normal. No respiratory  distress. She has no wheezes. She has no rales.  Abdominal: Soft. Bowel sounds are normal. She exhibits no distension and no mass. There is no tenderness.  Musculoskeletal: Normal range of motion. She exhibits tenderness ( There appears to be some tenderness around the shoulder girdle and over the rhomboid area muscles, some tenderness up into the trapezius and over the cervical spine). She exhibits no edema.  Lymphadenopathy:    She has no cervical adenopathy.  Neurological: She is alert. Coordination normal.  Normal strength in all 4 extremities, she has normal grips, she has normal sensation of the bilateral hands and arms except for an area over the left thumb and second finger which she states has decreased sensation and feels "fuzzy". She has a normal neurologic exam of her lower extremities with strength and sensation. Cranial nerves III through XII are normal, speech is clear and goal-directed  Skin: Skin is warm and dry. No rash noted. No erythema.  No changes in skin, no rashes  Psychiatric: She has a normal mood and affect. Her behavior is normal.  Nursing note and vitals reviewed.    ED Treatments / Results  Labs (all labs ordered are listed, but only abnormal results are displayed) Labs Reviewed  BASIC METABOLIC PANEL - Abnormal; Notable for the following:       Result Value   Sodium 132 (*)    Chloride 99 (*)    Glucose, Bld 453 (*)    All other components within normal limits  CBG MONITORING, ED - Abnormal; Notable for the following:    Glucose-Capillary 426 (*)    All other components within normal limits  CBG MONITORING, ED - Abnormal; Notable for the following:    Glucose-Capillary 312 (*)    All other components within normal limits    EKG  EKG Interpretation  Date/Time:  Friday July 31 2016 06:44:39 EDT Ventricular Rate:  77 PR Interval:    QRS Duration: 100 QT Interval:  389 QTC Calculation: 441 R Axis:   54 Text Interpretation:  Sinus rhythm Atrial  premature complexes in couplets Abnormal R-wave progression, late transition No old tracing to compare Abnormal ekg Confirmed by Hyacinth Meeker  MD, Asja Frommer ([id number]) on 07/31/2016 7:15:27 AM       Radiology Mr Cervical Spine Wo Contrast  Result Date: 07/31/2016 CLINICAL DATA:  Neck pain and left arm pain. EXAM: MRI CERVICAL SPINE WITHOUT CONTRAST TECHNIQUE: Multiplanar, multisequence MR imaging of the cervical spine was performed. No intravenous contrast was administered. COMPARISON:  None. FINDINGS: Alignment: Physiologic. Vertebrae: No fracture, evidence of discitis, or bone lesion. Cord: Normal signal and morphology. Posterior Fossa, vertebral arteries, paraspinal tissues: Diffuse enlargement and nodularity of the thyroid gland, incompletely visualized. The finding  is suggestive of a multinodular goiter. Disc levels: Craniocervical junction through C2-3:  Normal. C3-4: Small broad-based disc osteophyte complex with no neural impingement. Widely patent neural foramina. C4-5: Tiny disc bulge central and to the right with no neural impingement. C5-6: Small broad-based disc bulge slightly asymmetric to the left with no neural impingement. Widely patent neural foramina. C6-7: Disc osteophyte complex central and to the left with slight encroachment upon the left lateral recess and left neural foramen which might affect the left C7 nerve. C7-T1 through T2-3:  Negative. IMPRESSION: 1. Left lateral recess and left foraminal encroachment by a uncinate osteophytes at C6-7 which might affect the left C7 nerve. 2. Slight degenerative changes of the discs at C3-4 through C5-6 as described above. Electronically Signed   By: Francene Boyers M.D.   On: 07/31/2016 09:15    Procedures Procedures (including critical care time)  Medications Ordered in ED Medications  morphine 4 MG/ML injection 4 mg (4 mg Intravenous Not Given 07/31/16 0741)  methocarbamol (ROBAXIN) tablet 500 mg (500 mg Oral Given 07/31/16 0737)  dexamethasone  (DECADRON) injection 10 mg (10 mg Intravenous Given 07/31/16 0738)  morphine 2 MG/ML injection (4 mg Intravenous Given 07/31/16 0737)  sodium chloride 0.9 % bolus 1,000 mL (1,000 mLs Intravenous New Bag/Given 07/31/16 0921)  insulin aspart (novoLOG) injection 10 Units (10 Units Intravenous Given 07/31/16 0921)     Initial Impression / Assessment and Plan / ED Course  I have reviewed the triage vital signs and the nursing notes.  Pertinent labs & imaging results that were available during my care of the patient were reviewed by me and considered in my medical decision making (see chart for details).     The etiology of the patient's symptoms is unclear though it does appear to be consistent with a radiculopathy. Due to the severity of the patient's pain and the acute onset of symptoms over the last couple of days this warrants further evaluation with MR of the cervical spine. I will give her a steroid, check labs to rule out severe hyperglycemia in the presence of giving a steroid 2 diabetic. Pain medications will be ordered as well as a muscle relaxant. The patient is in agreement with the plan.  MR results discussed with the patient, hyperglycemia confirmed and the patient was told that she needed to restart metformin, she is aware of the need to follow a diabetic diet. She is in agreement. She also also aware that her blood sugar may rise because of the steroid and has been given the indications for return, she pressed her understanding.    Final Clinical Impressions(s) / ED Diagnoses   Final diagnoses:  Hyperglycemia  Radiculopathy, cervical    New Prescriptions New Prescriptions   IBUPROFEN (ADVIL,MOTRIN) 600 MG TABLET    Take 1 tablet (600 mg total) by mouth every 6 (six) hours as needed.   METFORMIN (GLUCOPHAGE) 500 MG TABLET    Take 1 tablet (500 mg total) by mouth 2 (two) times daily with a meal.   METHOCARBAMOL (ROBAXIN) 500 MG TABLET    Take 1 tablet (500 mg total) by mouth 2 (two)  times daily as needed for muscle spasms.     Eber Hong, MD 07/31/16 1226

## 2016-12-06 ENCOUNTER — Encounter (HOSPITAL_COMMUNITY): Payer: Self-pay

## 2016-12-06 ENCOUNTER — Emergency Department (HOSPITAL_COMMUNITY)
Admission: EM | Admit: 2016-12-06 | Discharge: 2016-12-06 | Disposition: A | Discharge status: Home / Self Care | Payer: Self-pay | Attending: Emergency Medicine | Admitting: Emergency Medicine

## 2016-12-06 DIAGNOSIS — F17210 Nicotine dependence, cigarettes, uncomplicated: Secondary | ICD-10-CM | POA: Insufficient documentation

## 2016-12-06 DIAGNOSIS — R21 Rash and other nonspecific skin eruption: Secondary | ICD-10-CM | POA: Insufficient documentation

## 2016-12-06 DIAGNOSIS — E039 Hypothyroidism, unspecified: Secondary | ICD-10-CM | POA: Insufficient documentation

## 2016-12-06 DIAGNOSIS — R739 Hyperglycemia, unspecified: Secondary | ICD-10-CM

## 2016-12-06 DIAGNOSIS — Z9189 Other specified personal risk factors, not elsewhere classified: Secondary | ICD-10-CM | POA: Insufficient documentation

## 2016-12-06 DIAGNOSIS — E1165 Type 2 diabetes mellitus with hyperglycemia: Secondary | ICD-10-CM | POA: Insufficient documentation

## 2016-12-06 DIAGNOSIS — Z9114 Patient's other noncompliance with medication regimen: Secondary | ICD-10-CM | POA: Insufficient documentation

## 2016-12-06 DIAGNOSIS — L03011 Cellulitis of right finger: Secondary | ICD-10-CM | POA: Insufficient documentation

## 2016-12-06 LAB — CBG MONITORING, ED: Glucose-Capillary: 391 mg/dL — ABNORMAL HIGH (ref 65–99)

## 2016-12-06 MED ORDER — DOXYCYCLINE HYCLATE 100 MG PO CAPS: 100.0000 mg | ORAL_CAPSULE | Freq: Two times a day (BID) | ORAL | 0 refills | Status: DC

## 2016-12-06 MED ORDER — INSULIN ASPART 100 UNIT/ML ~~LOC~~ SOLN
5.0000 [IU] | Freq: Once | SUBCUTANEOUS | Status: AC
  Administered 2016-12-06: 5 [IU] via SUBCUTANEOUS
  Filled 2016-12-06: qty 1

## 2016-12-06 MED ORDER — DOXYCYCLINE HYCLATE 100 MG PO TABS
100.0000 mg | ORAL_TABLET | Freq: Once | ORAL | Status: AC
  Administered 2016-12-06: 100 mg via ORAL
  Filled 2016-12-06: qty 1

## 2016-12-06 MED ORDER — METFORMIN HCL 500 MG PO TABS: 500.0000 mg | ORAL_TABLET | Freq: Two times a day (BID) | ORAL | 0 refills | Status: DC

## 2016-12-06 NOTE — ED Notes (Signed)
ED Provider at bedside. 

## 2016-12-06 NOTE — ED Triage Notes (Signed)
Pt reports rash to neck, head, chest, and arms that started 3 days ago that itches and burns. Pt has not been evaluated by physician for this.

## 2016-12-06 NOTE — Discharge Instructions (Signed)
You need to get serious about following your diabetic diet. You also need to get a primary care doctor so you can remain on diabetes medication. Soak your thumb thumb in warm Epsom salts. Take the antibiotics until gone. I gave you a couple of numbers to call to try to get a primary care doctor. Recheck if you get worse.

## 2016-12-06 NOTE — ED Triage Notes (Signed)
Pt also has hang nail to right thumb that she has been "trying to get to drain, but can't". Pt also reports having HTN that she is not taking BP meds as prescribed due to finances.

## 2016-12-06 NOTE — ED Provider Notes (Signed)
[facility] Provider Note   CSN: [id number] Arrival date & time: 12/06/16  0235  Time seen 02:10 AM   History   Chief Complaint Chief Complaint  Patient presents with  . Rash    HPI Maria Leblanc is a 61 y.o. female.  HPI   Patient reports she started getting a rash on her neck which is spread to her arms, chest, head and face. The rash started 3 days ago. She states tonight it started itching and burning. She took Benadryl without relief. She denies fever, new exposures including makeup, clothing, foods, soaps or detergents. She does report she has had frequent tick exposure because she goes outside to feed her dogs. She denies any shortness of breath or difficulty swallowing or breathing.  She also reports she's had some swelling of her right thumb over the past several days. It has been draining however it remains swollen. She's been soaking it in warm Epsom salts.  Patient has diabetes however she is not taking her metformin because she cannot afford to see the doctor to keep up her prescriptions.  PCP Dr Hassan Rowan   Past Medical History:  Diagnosis Date  . Ankle fracture 02/18/2016   left  . Borderline diabetes   . Diverticulosis   . Headache   . History of kidney stones   . Hypothyroid     There are no active problems to display for this patient.   Past Surgical History:  Procedure Laterality Date  . CYST REMOVAL NECK     x3  . DIAGNOSTIC LAPAROSCOPY     as teenager  . ORIF ANKLE FRACTURE Left 02/28/2016   Procedure: OPEN REDUCTION INTERNAL FIXATION (ORIF) ANKLE FRACTURE, LEFT;  Surgeon: Sheral Apley, MD;  Location: [facility];  Service: Orthopedics;  Laterality: Left;  . TONSILLECTOMY     as child    OB History    No data available       Home Medications    Prior to Admission medications   Medication Sig Start Date End Date Taking? Authorizing Provider  doxycycline (VIBRAMYCIN) 100 MG capsule Take 1 capsule (100 mg  total) by mouth 2 (two) times daily. 12/06/16   Devoria Albe, MD  ibuprofen (ADVIL,MOTRIN) 600 MG tablet Take 1 tablet (600 mg total) by mouth every 6 (six) hours as needed. 07/31/16   Eber Hong, MD  metFORMIN (GLUCOPHAGE) 500 MG tablet Take 1 tablet (500 mg total) by mouth 2 (two) times daily with a meal. 12/06/16   Devoria Albe, MD  methocarbamol (ROBAXIN) 500 MG tablet Take 1 tablet (500 mg total) by mouth 2 (two) times daily as needed for muscle spasms. 07/31/16   Eber Hong, MD    Family History No family history on file.  Social History Social History  Substance Use Topics  . Smoking status: Current Every Day Smoker    Packs/day: 0.50    Years: 20.00    Types: Cigarettes  . Smokeless tobacco: Never Used  . Alcohol use Yes     Comment: rare use of liquor 3 times yearly      Allergies   Patient has no known allergies.   Review of Systems Review of Systems  All other systems reviewed and are negative.    Physical Exam Updated Vital Signs BP (!) 138/98 (BP Location: Left Arm)   Pulse 79   Temp 98.4 F (36.9 C) (Oral)   Resp 17   Ht 5\' 6"  (1.676 m)   Wt 78.9 kg (  174 lb)   SpO2 98%   BMI 28.08 kg/m   Vital signs normal    Physical Exam  Constitutional: She is oriented to person, place, and time. She appears well-developed and well-nourished.  Non-toxic appearance. She does not appear ill. No distress.  HENT:  Head: Normocephalic and atraumatic.  Right Ear: External ear normal.  Left Ear: External ear normal.  Nose: Nose normal. No mucosal edema or rhinorrhea.  Mouth/Throat: Oropharynx is clear and moist and mucous membranes are normal. No dental abscesses or uvula swelling.  Eyes: Pupils are equal, round, and reactive to light. Conjunctivae and EOM are normal.  Neck: Normal range of motion and full passive range of motion without pain. Neck supple.  Cardiovascular: Normal rate, regular rhythm and normal heart sounds.  Exam reveals no gallop and no friction rub.    No murmur heard. Pulmonary/Chest: Effort normal and breath sounds normal. No respiratory distress. She has no wheezes. She has no rhonchi. She has no rales. She exhibits no tenderness and no crepitus.  Abdominal: Normal appearance.  Musculoskeletal: Normal range of motion. She exhibits edema. She exhibits no tenderness.  Moves all extremities well. Patient has diffuse swelling and some redness around the nail bed of her right thumb with an area where it has been draining.  Neurological: She is alert and oriented to person, place, and time. She has normal strength. No cranial nerve deficit.  Skin: Skin is warm, dry and intact. Rash noted. No erythema. No pallor.  Patient has a diffuse maculopapular rash that is on her neck, face, her trunk, although not as heavy, and her upper extremities worse proximally, and a few scattered ones on her lower extremities.  Psychiatric: She has a normal mood and affect. Her speech is normal and behavior is normal. Her mood appears not anxious.  Nursing note and vitals reviewed.        ED Treatments / Results  Labs (all labs ordered are listed, but only abnormal results are displayed) Results for orders placed or performed during the hospital encounter of 12/06/16  CBG monitoring, ED  Result Value Ref Range   Glucose-Capillary 391 (H) 65 - 99 mg/dL   Laboratory interpretation all normal except Hyperglycemia    EKG  EKG Interpretation None       Radiology No results found.  Procedures Procedures (including critical care time)  Medications Ordered in ED Medications  insulin aspart (novoLOG) injection 5 Units (5 Units Subcutaneous Given 12/06/16 0619)  doxycycline (VIBRA-TABS) tablet 100 mg (100 mg Oral Given 12/06/16 [id number])     Initial Impression / Assessment and Plan / ED Course  I have reviewed the triage vital signs and the nursing notes.  Pertinent labs & imaging results that were available during my care of the patient were  reviewed by me and considered in my medical decision making (see chart for details).    A CBG was done to evaluate patient's diabetes without medication. Her blood sugar is high. She was given the option of getting IV fluids and IV insulin or getting a subcutaneous insulin injection. She prefers to do the subcutaneous insulin. We discussed she needs to get a primary care doctor. She [facility] She was given information about the diabetic diet. She does admit to drinking a milkshake tonight. Her paronychias Artie been draining, I suspect it's not improving because of her hyperglycemia. She has noticed new exposures, I am afraid that her rash  may be related to a tickborne illness. She was started on doxycycline which should help with her thumb and hopefully this rash.  Final Clinical Impressions(s) / ED Diagnoses   Final diagnoses:  At high risk for tick borne illness  Rash  Paronychia of thumb, right  Hyperglycemia    New Prescriptions Discharge Medication List as of 12/06/2016  6:10 AM    START taking these medications   Details  doxycycline (VIBRAMYCIN) 100 MG capsule Take 1 capsule (100 mg total) by mouth 2 (two) times daily., Starting Sun 12/06/2016, Print       metFORMIN (GLUCOPHAGE) 500 MG tablet Take 1 tablet (500 mg total) by mouth 2 (two) times daily with a meal.    Plan discharge  Devoria Albe, MD, Concha Pyo, MD 12/06/16 [phone]

## 2017-03-26 ENCOUNTER — Other Ambulatory Visit: Payer: Self-pay

## 2017-03-26 ENCOUNTER — Encounter (HOSPITAL_COMMUNITY): Payer: Self-pay

## 2017-03-26 ENCOUNTER — Inpatient Hospital Stay (HOSPITAL_COMMUNITY)
Admission: EM | Admit: 2017-03-26 | Discharge: 2017-03-30 | DRG: 854 | Disposition: A | Discharge status: Home / Self Care | Payer: Self-pay | Attending: Internal Medicine | Admitting: Internal Medicine

## 2017-03-26 DIAGNOSIS — R509 Fever, unspecified: Secondary | ICD-10-CM

## 2017-03-26 DIAGNOSIS — R1011 Right upper quadrant pain: Secondary | ICD-10-CM | POA: Diagnosis present

## 2017-03-26 DIAGNOSIS — D696 Thrombocytopenia, unspecified: Secondary | ICD-10-CM | POA: Diagnosis present

## 2017-03-26 DIAGNOSIS — K8000 Calculus of gallbladder with acute cholecystitis without obstruction: Secondary | ICD-10-CM | POA: Diagnosis present

## 2017-03-26 DIAGNOSIS — F17210 Nicotine dependence, cigarettes, uncomplicated: Secondary | ICD-10-CM | POA: Diagnosis present

## 2017-03-26 DIAGNOSIS — E871 Hypo-osmolality and hyponatremia: Secondary | ICD-10-CM | POA: Diagnosis present

## 2017-03-26 DIAGNOSIS — G43909 Migraine, unspecified, not intractable, without status migrainosus: Secondary | ICD-10-CM | POA: Diagnosis present

## 2017-03-26 DIAGNOSIS — E039 Hypothyroidism, unspecified: Secondary | ICD-10-CM | POA: Diagnosis present

## 2017-03-26 DIAGNOSIS — N764 Abscess of vulva: Secondary | ICD-10-CM | POA: Diagnosis present

## 2017-03-26 DIAGNOSIS — Z87442 Personal history of urinary calculi: Secondary | ICD-10-CM

## 2017-03-26 DIAGNOSIS — K611 Rectal abscess: Secondary | ICD-10-CM | POA: Diagnosis present

## 2017-03-26 DIAGNOSIS — A419 Sepsis, unspecified organism: Principal | ICD-10-CM | POA: Diagnosis present

## 2017-03-26 DIAGNOSIS — K8020 Calculus of gallbladder without cholecystitis without obstruction: Secondary | ICD-10-CM

## 2017-03-26 DIAGNOSIS — E279 Disorder of adrenal gland, unspecified: Secondary | ICD-10-CM

## 2017-03-26 DIAGNOSIS — E861 Hypovolemia: Secondary | ICD-10-CM | POA: Diagnosis present

## 2017-03-26 DIAGNOSIS — R6510 Systemic inflammatory response syndrome (SIRS) of non-infectious origin without acute organ dysfunction: Secondary | ICD-10-CM

## 2017-03-26 DIAGNOSIS — E119 Type 2 diabetes mellitus without complications: Secondary | ICD-10-CM | POA: Diagnosis present

## 2017-03-26 DIAGNOSIS — E278 Other specified disorders of adrenal gland: Secondary | ICD-10-CM | POA: Diagnosis present

## 2017-03-26 HISTORY — DX: Other complications of anesthesia, initial encounter: T88.59XA

## 2017-03-26 HISTORY — DX: Adverse effect of unspecified anesthetic, initial encounter: T41.45XA

## 2017-03-26 MED ORDER — ACETAMINOPHEN 325 MG PO TABS
650.0000 mg | ORAL_TABLET | Freq: Once | ORAL | Status: AC
  Administered 2017-03-26: 650 mg via ORAL
  Filled 2017-03-26: qty 2

## 2017-03-26 MED ORDER — SODIUM CHLORIDE 0.9 % IV BOLUS (SEPSIS)
500.0000 mL | Freq: Once | INTRAVENOUS | Status: AC
  Administered 2017-03-26: 500 mL via INTRAVENOUS

## 2017-03-26 NOTE — ED Triage Notes (Signed)
4 days of vomiting, chills, cough, headache.  Pt denies diarrhea, is not sure if she has run a fever.

## 2017-03-27 ENCOUNTER — Emergency Department (HOSPITAL_COMMUNITY): Payer: Self-pay

## 2017-03-27 ENCOUNTER — Observation Stay (HOSPITAL_COMMUNITY): Payer: Self-pay

## 2017-03-27 ENCOUNTER — Encounter (HOSPITAL_COMMUNITY): Payer: Self-pay | Admitting: Family Medicine

## 2017-03-27 ENCOUNTER — Other Ambulatory Visit: Payer: Self-pay

## 2017-03-27 DIAGNOSIS — R6510 Systemic inflammatory response syndrome (SIRS) of non-infectious origin without acute organ dysfunction: Secondary | ICD-10-CM

## 2017-03-27 DIAGNOSIS — E278 Other specified disorders of adrenal gland: Secondary | ICD-10-CM | POA: Diagnosis present

## 2017-03-27 DIAGNOSIS — K8020 Calculus of gallbladder without cholecystitis without obstruction: Secondary | ICD-10-CM

## 2017-03-27 DIAGNOSIS — D696 Thrombocytopenia, unspecified: Secondary | ICD-10-CM | POA: Diagnosis present

## 2017-03-27 DIAGNOSIS — R1011 Right upper quadrant pain: Secondary | ICD-10-CM | POA: Diagnosis present

## 2017-03-27 DIAGNOSIS — E119 Type 2 diabetes mellitus without complications: Secondary | ICD-10-CM

## 2017-03-27 DIAGNOSIS — E871 Hypo-osmolality and hyponatremia: Secondary | ICD-10-CM | POA: Diagnosis present

## 2017-03-27 DIAGNOSIS — A419 Sepsis, unspecified organism: Secondary | ICD-10-CM | POA: Diagnosis present

## 2017-03-27 DIAGNOSIS — E279 Disorder of adrenal gland, unspecified: Secondary | ICD-10-CM

## 2017-03-27 LAB — COMPREHENSIVE METABOLIC PANEL
ALBUMIN: 3.3 g/dL — AB (ref 3.5–5.0)
ALT: 11 U/L — AB (ref 14–54)
ALT: 5 U/L — AB (ref 14–54)
AST: 11 U/L — AB (ref 15–41)
AST: 13 U/L — AB (ref 15–41)
Albumin: 3 g/dL — ABNORMAL LOW (ref 3.5–5.0)
Alkaline Phosphatase: 111 U/L (ref 38–126)
Alkaline Phosphatase: 124 U/L (ref 38–126)
Anion gap: 6 (ref 5–15)
Anion gap: 9 (ref 5–15)
BILIRUBIN TOTAL: 1.3 mg/dL — AB (ref 0.3–1.2)
BUN: 10 mg/dL (ref 6–20)
BUN: 10 mg/dL (ref 6–20)
CALCIUM: 10 mg/dL (ref 8.9–10.3)
CHLORIDE: 97 mmol/L — AB (ref 101–111)
CO2: 24 mmol/L (ref 22–32)
CO2: 28 mmol/L (ref 22–32)
CREATININE: 0.41 mg/dL — AB (ref 0.44–1.00)
CREATININE: 0.44 mg/dL (ref 0.44–1.00)
Calcium: 9.2 mg/dL (ref 8.9–10.3)
Chloride: 99 mmol/L — ABNORMAL LOW (ref 101–111)
GFR calc Af Amer: >60 mL/min (ref >60)
GFR calc Af Amer: >60 mL/min (ref >60)
GFR calc non Af Amer: >60 mL/min (ref >60)
Glucose, Bld: 258 mg/dL — ABNORMAL HIGH (ref 65–99)
Glucose, Bld: 318 mg/dL — ABNORMAL HIGH (ref 65–99)
POTASSIUM: 3.6 mmol/L (ref 3.5–5.1)
Potassium: 3.3 mmol/L — ABNORMAL LOW (ref 3.5–5.1)
SODIUM: 130 mmol/L — AB (ref 135–145)
Sodium: 133 mmol/L — ABNORMAL LOW (ref 135–145)
TOTAL PROTEIN: 6.2 g/dL — AB (ref 6.5–8.1)
Total Bilirubin: 1.4 mg/dL — ABNORMAL HIGH (ref 0.3–1.2)
Total Protein: 7.2 g/dL (ref 6.5–8.1)

## 2017-03-27 LAB — CBC WITH DIFFERENTIAL/PLATELET
BASOS ABS: 0 10*3/uL (ref 0.0–0.1)
BASOS PCT: 0 %
BASOS PCT: 0 %
Basophils Absolute: 0 10*3/uL (ref 0.0–0.1)
EOS ABS: 0.2 10*3/uL (ref 0.0–0.7)
EOS ABS: 0.1 10*3/uL (ref 0.0–0.7)
EOS PCT: 0 %
Eosinophils Relative: 1 %
HCT: 42.8 % (ref 36.0–46.0)
HCT: 44.4 % (ref 36.0–46.0)
HEMOGLOBIN: 13.9 g/dL (ref 12.0–15.0)
Hemoglobin: 14.8 g/dL (ref 12.0–15.0)
LYMPHS PCT: 8 %
Lymphocytes Relative: 8 %
Lymphs Abs: 1.6 10*3/uL (ref 0.7–4.0)
Lymphs Abs: 1.9 10*3/uL (ref 0.7–4.0)
MCH: 30.1 pg (ref 26.0–34.0)
MCH: 30.5 pg (ref 26.0–34.0)
MCHC: 32.5 g/dL (ref 30.0–36.0)
MCHC: 33.3 g/dL (ref 30.0–36.0)
MCV: 91.5 fL (ref 78.0–100.0)
MCV: 92.6 fL (ref 78.0–100.0)
MONOS PCT: 8 %
Monocytes Absolute: 1.7 10*3/uL — ABNORMAL HIGH (ref 0.1–1.0)
Monocytes Absolute: 1.7 10*3/uL — ABNORMAL HIGH (ref 0.1–1.0)
Monocytes Relative: 7 %
NEUTROS PCT: 83 %
Neutro Abs: 17.3 10*3/uL — ABNORMAL HIGH (ref 1.7–7.7)
Neutro Abs: 20.9 10*3/uL — ABNORMAL HIGH (ref 1.7–7.7)
Neutrophils Relative %: 85 %
PLATELETS: 176 10*3/uL (ref 150–400)
PLATELETS: 126 10*3/uL — AB (ref 150–400)
RBC: 4.62 MIL/uL (ref 3.87–5.11)
RBC: 4.85 MIL/uL (ref 3.87–5.11)
RDW: 12.8 % (ref 11.5–15.5)
RDW: 12.6 % (ref 11.5–15.5)
WBC: 20.8 10*3/uL — AB (ref 4.0–10.5)
WBC: 24.6 10*3/uL — AB (ref 4.0–10.5)

## 2017-03-27 LAB — GLUCOSE, CAPILLARY
GLUCOSE-CAPILLARY: 262 mg/dL — AB (ref 65–99)
GLUCOSE-CAPILLARY: 277 mg/dL — AB (ref 65–99)
GLUCOSE-CAPILLARY: 142 mg/dL — AB (ref 65–99)
Glucose-Capillary: 114 mg/dL — ABNORMAL HIGH (ref 65–99)
Glucose-Capillary: 163 mg/dL — ABNORMAL HIGH (ref 65–99)
Glucose-Capillary: 274 mg/dL — ABNORMAL HIGH (ref 65–99)

## 2017-03-27 LAB — I-STAT CG4 LACTIC ACID, ED: LACTIC ACID, VENOUS: 0.75 mmol/L (ref 0.5–1.9)

## 2017-03-27 LAB — URINALYSIS, ROUTINE W REFLEX MICROSCOPIC
BACTERIA UA: NONE SEEN
Bilirubin Urine: NEGATIVE
Glucose, UA: >=500 mg/dL — AB
HGB URINE DIPSTICK: NEGATIVE
KETONES UR: 5 mg/dL — AB
Leukocytes, UA: NEGATIVE
NITRITE: NEGATIVE
PROTEIN: NEGATIVE mg/dL
Specific Gravity, Urine: >1.046 — ABNORMAL HIGH (ref 1.005–1.030)
pH: 5 (ref 5.0–8.0)

## 2017-03-27 LAB — LIPASE, BLOOD: Lipase: 16 U/L (ref 11–51)

## 2017-03-27 LAB — TROPONIN I

## 2017-03-27 LAB — INFLUENZA PANEL BY PCR (TYPE A & B)
Influenza A By PCR: NEGATIVE
Influenza B By PCR: NEGATIVE

## 2017-03-27 MED ORDER — IOPAMIDOL (ISOVUE-300) INJECTION 61%
100.0000 mL | Freq: Once | INTRAVENOUS | Status: AC | PRN
  Administered 2017-03-27: 100 mL via INTRAVENOUS

## 2017-03-27 MED ORDER — SODIUM CHLORIDE 0.9 % IV SOLN
INTRAVENOUS | Status: AC
  Administered 2017-03-27 – 2017-03-28 (×3): via INTRAVENOUS

## 2017-03-27 MED ORDER — SODIUM CHLORIDE 0.9 % IV SOLN
INTRAVENOUS | Status: AC
  Administered 2017-03-27: 07:00:00 via INTRAVENOUS

## 2017-03-27 MED ORDER — ACETAMINOPHEN 325 MG PO TABS
650.0000 mg | ORAL_TABLET | Freq: Four times a day (QID) | ORAL | Status: DC | PRN
  Administered 2017-03-27 – 2017-03-28 (×3): 650 mg via ORAL
  Filled 2017-03-27 (×3): qty 2

## 2017-03-27 MED ORDER — INSULIN ASPART 100 UNIT/ML ~~LOC~~ SOLN
0.0000 [IU] | SUBCUTANEOUS | Status: DC
  Administered 2017-03-27 (×2): 5 [IU] via SUBCUTANEOUS
  Administered 2017-03-27: 1 [IU] via SUBCUTANEOUS
  Administered 2017-03-28: 2 [IU] via SUBCUTANEOUS
  Administered 2017-03-29 – 2017-03-30 (×4): 3 [IU] via SUBCUTANEOUS

## 2017-03-27 MED ORDER — ENOXAPARIN SODIUM 40 MG/0.4ML ~~LOC~~ SOLN
40.0000 mg | SUBCUTANEOUS | Status: DC
  Administered 2017-03-27 – 2017-03-28 (×2): 40 mg via SUBCUTANEOUS
  Filled 2017-03-27 (×3): qty 0.4

## 2017-03-27 MED ORDER — SODIUM CHLORIDE 0.9 % IV SOLN
INTRAVENOUS | Status: AC
  Filled 2017-03-27 (×2): qty 3

## 2017-03-27 MED ORDER — SODIUM CHLORIDE 0.9 % IV SOLN
3.0000 g | Freq: Four times a day (QID) | INTRAVENOUS | Status: DC
  Administered 2017-03-27 – 2017-03-28 (×3): 3 g via INTRAVENOUS
  Filled 2017-03-27 (×9): qty 3

## 2017-03-27 MED ORDER — HYDROCODONE-ACETAMINOPHEN 5-325 MG PO TABS
1.0000 | ORAL_TABLET | ORAL | Status: DC | PRN
Start: 2017-03-27 — End: 2017-03-29

## 2017-03-27 MED ORDER — ONDANSETRON HCL 4 MG/2ML IJ SOLN
4.0000 mg | Freq: Four times a day (QID) | INTRAMUSCULAR | Status: DC | PRN
  Administered 2017-03-27 – 2017-03-28 (×2): 4 mg via INTRAVENOUS
  Filled 2017-03-27 (×3): qty 2

## 2017-03-27 MED ORDER — FENTANYL CITRATE (PF) 100 MCG/2ML IJ SOLN
50.0000 ug | INTRAMUSCULAR | Status: DC | PRN
  Administered 2017-03-27 – 2017-03-29 (×3): 50 ug via INTRAVENOUS
  Filled 2017-03-27 (×3): qty 2

## 2017-03-27 MED ORDER — SODIUM CHLORIDE 0.9 % IV BOLUS (SEPSIS)
1000.0000 mL | Freq: Once | INTRAVENOUS | Status: AC
  Administered 2017-03-27: 1000 mL via INTRAVENOUS

## 2017-03-27 MED ORDER — PIPERACILLIN-TAZOBACTAM 3.375 G IVPB 30 MIN
3.3750 g | Freq: Once | INTRAVENOUS | Status: AC
  Administered 2017-03-27: 3.375 g via INTRAVENOUS
  Filled 2017-03-27: qty 50

## 2017-03-27 MED ORDER — SUMATRIPTAN SUCCINATE 6 MG/0.5ML ~~LOC~~ SOLN
6.0000 mg | Freq: Once | SUBCUTANEOUS | Status: AC
  Administered 2017-03-27: 6 mg via SUBCUTANEOUS
  Filled 2017-03-27: qty 0.5

## 2017-03-27 MED ORDER — METOCLOPRAMIDE HCL 5 MG/ML IJ SOLN
10.0000 mg | Freq: Once | INTRAMUSCULAR | Status: AC
  Administered 2017-03-27: 10 mg via INTRAVENOUS
  Filled 2017-03-27: qty 2

## 2017-03-27 MED ORDER — ONDANSETRON HCL 4 MG/2ML IJ SOLN
4.0000 mg | Freq: Once | INTRAMUSCULAR | Status: AC
  Administered 2017-03-27: 4 mg via INTRAVENOUS
  Filled 2017-03-27: qty 2

## 2017-03-27 MED ORDER — ACETAMINOPHEN 650 MG RE SUPP: 650.0000 mg | Freq: Four times a day (QID) | RECTAL | Status: DC | PRN

## 2017-03-27 MED ORDER — ONDANSETRON HCL 4 MG PO TABS
4.0000 mg | ORAL_TABLET | Freq: Four times a day (QID) | ORAL | Status: DC | PRN
  Administered 2017-03-30: 4 mg via ORAL
  Filled 2017-03-27: qty 1

## 2017-03-27 NOTE — ED Provider Notes (Signed)
[facility] EMERGENCY DEPARTMENT Provider Note   CSN: [id number] Arrival date & time: 03/26/17  2234     History   Chief Complaint Chief Complaint  Patient presents with  . Emesis  . Headache    HPI Maria Leblanc is a 61 y.o. female.  Patient presents with a 5-day history of headache, chills, fever, nausea and vomiting with cough.  States initially she thought she had a migraine headache but she has been vomiting every day and not able to keep anything down.  She has not had any diarrhea.  Febrile on arrival to 101.  It does not have much of a cough.  She is an ex-smoker.  Her main complaint is headache and nausea and vomiting and not able to tolerate any food.  She has right-sided abdominal pain that is progressively worsening.  No pain with urination or blood in the stool.  No hematuria.  No vaginal bleeding or discharge.  She has had sick contacts at work with sinusitis.  Did not receive a flu shot.  She been taking ibuprofen at home without relief.   The history is provided by the patient.  Emesis   Associated symptoms include abdominal pain, arthralgias, chills, cough, a fever, headaches and myalgias. Pertinent negatives include no diarrhea.  Headache   Associated symptoms include a fever, nausea and vomiting. Pertinent negatives include no shortness of breath.    Past Medical History:  Diagnosis Date  . Ankle fracture 02/18/2016   left  . Borderline diabetes   . Diverticulosis   . Headache   . History of kidney stones   . Hypothyroid     There are no active problems to display for this patient.   Past Surgical History:  Procedure Laterality Date  . CYST REMOVAL NECK     x3  . DIAGNOSTIC LAPAROSCOPY     as teenager  . ORIF ANKLE FRACTURE Left 02/28/2016   Procedure: OPEN REDUCTION INTERNAL FIXATION (ORIF) ANKLE FRACTURE, LEFT;  Surgeon: Sheral Apley, MD;  Location: [facility];  Service: Orthopedics;  Laterality: Left;  . TONSILLECTOMY     as child    OB History    No data available       Home Medications    Prior to Admission medications   Medication Sig Start Date End Date Taking? Authorizing Provider  ibuprofen (ADVIL,MOTRIN) 200 MG tablet Take 400 mg by mouth every 6 (six) hours as needed for mild pain or moderate pain.   Yes [provider]  metFORMIN (GLUCOPHAGE) 500 MG tablet Take 1 tablet (500 mg total) by mouth 2 (two) times daily with a meal. Patient not taking: Reported on 03/26/2017 12/06/16   Devoria Albe, MD    Family History No family history on file.  Social History Social History   Tobacco Use  . Smoking status: Current Every Day Smoker    Packs/day: 0.50    Years: 20.00    Pack years: 10.00    Types: Cigarettes  . Smokeless tobacco: Never Used  Substance Use Topics  . Alcohol use: Yes    Comment: rare use of liquor 3 times yearly   . Drug use: No     Allergies   Patient has no known allergies.   Review of Systems Review of Systems  Constitutional: Positive for activity change, appetite change, chills, fatigue and fever.  HENT: Negative for congestion and rhinorrhea.   Respiratory: Positive for cough. Negative for shortness of breath.   Cardiovascular: Negative  for chest pain.  Gastrointestinal: Positive for abdominal pain, nausea and vomiting. Negative for diarrhea.  Genitourinary: Negative for flank pain, vaginal bleeding and vaginal discharge.  Musculoskeletal: Positive for arthralgias and myalgias.  Skin: Negative for rash.  Neurological: Positive for weakness and headaches.   all other systems are negative except as noted in the HPI and PMH.     Physical Exam Updated Vital Signs BP (!) 141/81   Pulse 83   Temp 98.7 F (37.1 C)   Resp 18   Ht 5' (1.524 m)   Wt 76.2 kg (168 lb)   SpO2 94%   BMI 32.81 kg/m   Physical Exam  Constitutional: She is oriented to person, place, and time. She appears well-developed and well-nourished. No distress.  Ill appearing,  nontoxic  HENT:  Head: Normocephalic and atraumatic.  Mouth/Throat: Oropharynx is clear and moist. No oropharyngeal exudate.  Eyes: Conjunctivae and EOM are normal. Pupils are equal, round, and reactive to light.  Neck: Normal range of motion. Neck supple.  No meningismus.  Cardiovascular: Normal rate, regular rhythm, normal heart sounds and intact distal pulses.  No murmur heard. Pulmonary/Chest: Effort normal and breath sounds normal. No respiratory distress.  Diminished breath sounds bilaterally  Abdominal: Soft. There is tenderness. There is guarding. There is no rebound.  TTP RLQ with guarding.   Musculoskeletal: Normal range of motion. She exhibits no edema or tenderness.  Neurological: She is alert and oriented to person, place, and time. No cranial nerve deficit. She exhibits normal muscle tone. Coordination normal.  No ataxia on finger to nose bilaterally. No pronator drift. 5/5 strength throughout. CN 2-12 intact.Equal grip strength. Sensation intact.   Skin: Skin is warm. Capillary refill takes less than 2 seconds.  Psychiatric: She has a normal mood and affect. Her behavior is normal.  Nursing note and vitals reviewed.    ED Treatments / Results  Labs (all labs ordered are listed, but only abnormal results are displayed) Labs Reviewed  URINALYSIS, ROUTINE W REFLEX MICROSCOPIC - Abnormal; Notable for the following components:      Result Value   Specific Gravity, Urine >1.046 (*)    Glucose, UA >=500 (*)    Ketones, ur 5 (*)    Squamous Epithelial / LPF 0-5 (*)    All other components within normal limits  CBC WITH DIFFERENTIAL/PLATELET - Abnormal; Notable for the following components:   WBC 24.6 (*)    Platelets 126 (*)    Neutro Abs 20.9 (*)    Monocytes Absolute 1.7 (*)    All other components within normal limits  COMPREHENSIVE METABOLIC PANEL - Abnormal; Notable for the following components:   Sodium 130 (*)    Chloride 97 (*)    Glucose, Bld 318 (*)     Albumin 3.3 (*)    AST 13 (*)    ALT 5 (*)    Total Bilirubin 1.4 (*)    All other components within normal limits  CULTURE, BLOOD (ROUTINE X 2)  CULTURE, BLOOD (ROUTINE X 2)  URINE CULTURE  INFLUENZA PANEL BY PCR (TYPE A & B)  LIPASE, BLOOD  HIV ANTIBODY (ROUTINE TESTING)  COMPREHENSIVE METABOLIC PANEL  CBC WITH DIFFERENTIAL/PLATELET  I-STAT CG4 LACTIC ACID, ED    EKG  EKG Interpretation None       Radiology Dg Chest 2 View  Result Date: 03/27/2017 CLINICAL DATA:  Cough and fever. EXAM: CHEST  2 VIEW COMPARISON:  Report from radiographs 07/15/2015, images not available. FINDINGS: The cardiomediastinal contours  are normal. Chronic elevation of right hemidiaphragm. Minimal streaky left lung base atelectasis. Pulmonary vasculature is normal. No consolidation, pleural effusion, or pneumothorax. No acute osseous abnormalities are seen. IMPRESSION: Minimal streaky left lung base atelectasis. Chronic elevation of right hemidiaphragm. Electronically Signed   By: Rubye Oaks M.D.   On: 03/27/2017 02:09   Ct Abdomen Pelvis W Contrast  Result Date: 03/27/2017 CLINICAL DATA:  Nausea and vomiting.  Chills. EXAM: CT ABDOMEN AND PELVIS WITH CONTRAST TECHNIQUE: Multidetector CT imaging of the abdomen and pelvis was performed using the standard protocol following bolus administration of intravenous contrast. CONTRAST:  ISOVUE-300 IOPAMIDOL (ISOVUE-300) INJECTION 61% COMPARISON:  CT 01/14/2004 FINDINGS: Lower chest: Multiple small pulmonary nodules in the both lower lobes are unchanged from prior exam and considered benign. Linear scarring in the right lower lobe. No pleural fluid or consolidation. Hepatobiliary: Multiple hepatic cysts again seen, largest lesion in the right lobe measures 15 mm. Minimal layering sludge or stones in the gallbladder without pericholecystic inflammation. No biliary dilatation. Pancreas: Fatty atrophy.  No ductal dilatation or inflammation. Spleen: Normal in size  without focal abnormality. Adrenals/Urinary Tract: 13 low-density left adrenal nodule, not seen on prior exam. No right adrenal nodule. No hydronephrosis or perinephric edema. Punctate nonobstructing stone in the lower right kidney. Homogeneous renal enhancement with symmetric excretion on delayed phase imaging. Urinary bladder is physiologically distended without wall thickening. Stomach/Bowel: Small hiatal hernia. Stomach is nondistended. No small bowel obstruction, wall thickening or inflammatory change. Normal appendix. Diverticulosis of the distal descending and sigmoid colon. No diverticulitis. No colonic inflammation. Vascular/Lymphatic: Normal caliber abdominal aorta. No enlarged abdominal or pelvic lymph nodes. Reproductive: Uterus and bilateral adnexa are unremarkable. Other: No free air, free fluid, or intra-abdominal fluid collection. Tiny fat containing umbilical hernia. Musculoskeletal: There are no acute or suspicious osseous abnormalities. IMPRESSION: 1. No acute abnormality in the abdomen/pelvis. 2. Stones or sludge in the gallbladder without gallbladder inflammation. 3. Left adrenal 13 mm nodule, nonspecific, new from prior exam. In the absence of malignancy history, consider follow-up adrenal CT on a nonemergent basis. If there is history of malignancy, recommend PET/CT. 4. Colonic diverticulosis without diverticulitis. Nonobstructing right renal stone. 5. Multiple small pulmonary nodules in the lower lobes are unchanged from remote prior CT and considered benign. Electronically Signed   By: Rubye Oaks M.D.   On: 03/27/2017 02:31    Procedures Procedures (including critical care time)  Medications Ordered in ED Medications  sodium chloride 0.9 % bolus 1,000 mL (not administered)  ondansetron (ZOFRAN) injection 4 mg (not administered)  acetaminophen (TYLENOL) tablet 650 mg (650 mg Oral Given 03/26/17 2248)  sodium chloride 0.9 % bolus 500 mL (0 mLs Intravenous Stopped 03/27/17 0045)      Initial Impression / Assessment and Plan / ED Course  I have reviewed the triage vital signs and the nursing notes.  Pertinent labs & imaging results that were available during my care of the patient were reviewed by me and considered in my medical decision making (see chart for details).    4 days of vomiting, chills, headache and diarrhea and abdominal pain.  Patient is febrile and ill-appearing on arrival.  No meningismus.  Labs show leukocytosis.  Lactate is normal. Chest x-ray shows no pneumonia.  CT scan obtained to evaluate for right lower quadrant pain.  There is no evidence of appendicitis.  CT scan discussed with patient.  Unclear source of patient's leukocytosis and fever.  Her headache has improved.  It is possible she  has a viral syndrome causing her illness.  On exam she still has significant right-sided abdominal pain.  CT scan does show multiple gallstones but no evidence of cholecystitis.  Her LFTs are normal.  Urinalysis is negative.  Ultrasound is not available at this time of night.  Concern for gallbladder pathology causing patient's fever and leukocytosis.  Patient will be started on IV Zosyn and be admitted for ultrasound in the morning and further evaluation. Admission d/w Dr. Antionette Charpyd. Final Clinical Impressions(s) / ED Diagnoses   Final diagnoses:  Gallstones  Fever, unspecified fever cause    ED Discharge Orders    None       Atziry Baranski, Jeannett SeniorStephen, MD 03/27/17 [phone]

## 2017-03-27 NOTE — H&P (Signed)
History and Physical    Maria Leblanc:[id number] DOB: 06-Nov-1955 DOA: 03/26/2017  PCP: [facility]   Patient coming from: Home  Chief Complaint: N/V, chills, headache, right-sided abdominal pain  HPI: Maria Leblanc is a 61 y.o. female with medical history significant for diabetes mellitus and nephrolithiasis, who presented to the emergency department for evaluation of 4-5 days of chills, nausea, vomiting, right-sided abdominal pain, and headache.  Pain is described as constant, worsening, crampy, and localized.  She denies diarrhea.  Denies dyspnea or significant cough.  Has been taking Advil at home with no significant relief.  ED Course: Upon arrival to the ED, patient is found to be febrile to 38.5 degree C, saturating adequately on room air, and with vitals otherwise stable.  Chemistry panel is notable for serum sodium of 130 and glucose of 318.  CBC features a leukocytosis to 24,600 and a thrombocytopenia with platelets 126,000.  Lactic acid is reassuring at 0.75.  Urinalysis is not suggestive of infection.  Influenza testing is negative.  Chest x-ray minimal atelectasis at the left base.  CT of the abdomen and pelvis is negative for acute intra-abdominal or pelvic pathology, but notable for stones or sludge in the gallbladder and a new left adrenal nodule.  Blood and urine cultures were collected in the ED, 1.5 L of normal saline was administered, and patient was treated with an empiric dose of Zosyn.  She reports improvement in her headache and overall malaise with the IV fluids.  She will be observed on the medical/surgical unit for ongoing evaluation and management of SIRS with right-sided abdominal pain, nausea, and vomiting.  Review of Systems:  All other systems reviewed and apart from HPI, are negative.  Past Medical History:  Diagnosis Date  . Ankle fracture 02/18/2016   left  . Borderline diabetes   . Diverticulosis   . Headache   . History of kidney  stones   . Hypothyroid     Past Surgical History:  Procedure Laterality Date  . CYST REMOVAL NECK     x3  . DIAGNOSTIC LAPAROSCOPY     as teenager  . ORIF ANKLE FRACTURE Left 02/28/2016   Procedure: OPEN REDUCTION INTERNAL FIXATION (ORIF) ANKLE FRACTURE, LEFT;  Surgeon: Sheral Apley, MD;  Location: [facility];  Service: Orthopedics;  Laterality: Left;  . TONSILLECTOMY     as child     reports that she has been smoking cigarettes.  She has a 10.00 pack-year smoking history. she has never used smokeless tobacco. She reports that she drinks alcohol. She reports that she does not use drugs.  No Known Allergies  History reviewed. No pertinent family history.   Prior to Admission medications   Medication Sig Start Date End Date Taking? Authorizing Provider  ibuprofen (ADVIL,MOTRIN) 200 MG tablet Take 400 mg by mouth every 6 (six) hours as needed for mild pain or moderate pain.   Yes [provider]  metFORMIN (GLUCOPHAGE) 500 MG tablet Take 1 tablet (500 mg total) by mouth 2 (two) times daily with a meal. Patient not taking: Reported on 03/26/2017 12/06/16   Devoria Albe, MD    Physical Exam: Vitals:   03/26/17 2246 03/27/17 0044 03/27/17 0329  BP: (!) 148/79 (!) 141/81 131/72  Pulse: 98 83 81  Resp: 20 18 18   Temp: (!) 101.3 F (38.5 C) 98.7 F (37.1 C) 98.8 F (37.1 C)  TempSrc: Oral  Oral  SpO2: 94% 94% 96%  Weight: 76.2  kg (168 lb)    Height: 5' (1.524 m)        Constitutional: NAD, calm, in apparent discomfort Eyes: PERTLA, lids and conjunctivae normal ENMT: Mucous membranes are moist. Posterior pharynx clear of any exudate or lesions.   Neck: normal, supple, no masses, no thyromegaly Respiratory: clear to auscultation bilaterally, no wheezing, no crackles. Normal respiratory effort.   Cardiovascular: S1 & S2 heard, regular rate and rhythm. No extremity edema. No significant JVD. Abdomen: No distension, soft, tender in RUQ, no rebound pain  or guarding. Bowel sounds active.  Musculoskeletal: no clubbing / cyanosis. No joint deformity upper and lower extremities.   Skin: no significant rashes, lesions, ulcers. Warm, dry, well-perfused. Neurologic: CN 2-12 grossly intact. Sensation intact. Strength 5/5 in all 4 limbs.  Psychiatric: Alert and oriented x 3. Calm, cooperative.     Labs on Admission: I have personally reviewed following labs and imaging studies  CBC: Recent Labs  Lab 03/26/17 2354  WBC 24.6*  NEUTROABS 20.9*  HGB 14.8  HCT 44.4  MCV 91.5  PLT 126*   Basic Metabolic Panel: Recent Labs  Lab 03/26/17 2354  NA 130*  K 3.6  CL 97*  CO2 24  GLUCOSE 318*  BUN 10  CREATININE 0.44  CALCIUM 10.0   GFR: Estimated Creatinine Clearance: 68.2 mL/min (by C-G formula based on SCr of 0.44 mg/dL). Liver Function Tests: Recent Labs  Lab 03/26/17 2354  AST 13*  ALT 5*  ALKPHOS 124  BILITOT 1.4*  PROT 7.2  ALBUMIN 3.3*   No results for input(s): LIPASE, AMYLASE in the last 168 hours. No results for input(s): AMMONIA in the last 168 hours. Coagulation Profile: No results for input(s): INR, PROTIME in the last 168 hours. Cardiac Enzymes: No results for input(s): CKTOTAL, CKMB, CKMBINDEX, TROPONINI in the last 168 hours. BNP (last 3 results) No results for input(s): PROBNP in the last 8760 hours. HbA1C: No results for input(s): HGBA1C in the last 72 hours. CBG: No results for input(s): GLUCAP in the last 168 hours. Lipid Profile: No results for input(s): CHOL, HDL, LDLCALC, TRIG, CHOLHDL, LDLDIRECT in the last 72 hours. Thyroid Function Tests: No results for input(s): TSH, T4TOTAL, FREET4, T3FREE, THYROIDAB in the last 72 hours. Anemia Panel: No results for input(s): VITAMINB12, FOLATE, FERRITIN, TIBC, IRON, RETICCTPCT in the last 72 hours. Urine analysis:    Component Value Date/Time   COLORURINE YELLOW 03/27/2017 0340   APPEARANCEUR CLEAR 03/27/2017 0340   LABSPEC >1.046 (H) 03/27/2017 0340    PHURINE 5.0 03/27/2017 0340   GLUCOSEU >=500 (A) 03/27/2017 0340   HGBUR NEGATIVE 03/27/2017 0340   BILIRUBINUR NEGATIVE 03/27/2017 0340   KETONESUR 5 (A) 03/27/2017 0340   PROTEINUR NEGATIVE 03/27/2017 0340   NITRITE NEGATIVE 03/27/2017 0340   LEUKOCYTESUR NEGATIVE 03/27/2017 0340   Sepsis Labs: @LABRCNTIP (procalcitonin:4,lacticidven:4) ) Recent Results (from the past 240 hour(s))  Blood culture (routine x 2)     Status: None (Preliminary result)   Collection Time: 03/27/17  1:32 AM  Result Value Ref Range Status   Specimen Description LEFT ANTECUBITAL  Final   Special Requests   Final    BOTTLES DRAWN AEROBIC AND ANAEROBIC Blood Culture adequate volume   Culture PENDING  Incomplete   Report Status PENDING  Incomplete  Blood culture (routine x 2)     Status: None (Preliminary result)   Collection Time: 03/27/17  1:47 AM  Result Value Ref Range Status   Specimen Description BLOOD RIGHT HAND  Final   Special  Requests   Final    BOTTLES DRAWN AEROBIC AND ANAEROBIC Blood Culture adequate volume   Culture PENDING  Incomplete   Report Status PENDING  Incomplete     Radiological Exams on Admission: Dg Chest 2 View  Result Date: 03/27/2017 CLINICAL DATA:  Cough and fever. EXAM: CHEST  2 VIEW COMPARISON:  Report from radiographs 07/15/2015, images not available. FINDINGS: The cardiomediastinal contours are normal. Chronic elevation of right hemidiaphragm. Minimal streaky left lung base atelectasis. Pulmonary vasculature is normal. No consolidation, pleural effusion, or pneumothorax. No acute osseous abnormalities are seen. IMPRESSION: Minimal streaky left lung base atelectasis. Chronic elevation of right hemidiaphragm. Electronically Signed   By: Rubye Oaks M.D.   On: 03/27/2017 02:09   Ct Abdomen Pelvis W Contrast  Result Date: 03/27/2017 CLINICAL DATA:  Nausea and vomiting.  Chills. EXAM: CT ABDOMEN AND PELVIS WITH CONTRAST TECHNIQUE: Multidetector CT imaging of the abdomen and  pelvis was performed using the standard protocol following bolus administration of intravenous contrast. CONTRAST:  ISOVUE-300 IOPAMIDOL (ISOVUE-300) INJECTION 61% COMPARISON:  CT 01/14/2004 FINDINGS: Lower chest: Multiple small pulmonary nodules in the both lower lobes are unchanged from prior exam and considered benign. Linear scarring in the right lower lobe. No pleural fluid or consolidation. Hepatobiliary: Multiple hepatic cysts again seen, largest lesion in the right lobe measures 15 mm. Minimal layering sludge or stones in the gallbladder without pericholecystic inflammation. No biliary dilatation. Pancreas: Fatty atrophy.  No ductal dilatation or inflammation. Spleen: Normal in size without focal abnormality. Adrenals/Urinary Tract: 13 low-density left adrenal nodule, not seen on prior exam. No right adrenal nodule. No hydronephrosis or perinephric edema. Punctate nonobstructing stone in the lower right kidney. Homogeneous renal enhancement with symmetric excretion on delayed phase imaging. Urinary bladder is physiologically distended without wall thickening. Stomach/Bowel: Small hiatal hernia. Stomach is nondistended. No small bowel obstruction, wall thickening or inflammatory change. Normal appendix. Diverticulosis of the distal descending and sigmoid colon. No diverticulitis. No colonic inflammation. Vascular/Lymphatic: Normal caliber abdominal aorta. No enlarged abdominal or pelvic lymph nodes. Reproductive: Uterus and bilateral adnexa are unremarkable. Other: No free air, free fluid, or intra-abdominal fluid collection. Tiny fat containing umbilical hernia. Musculoskeletal: There are no acute or suspicious osseous abnormalities. IMPRESSION: 1. No acute abnormality in the abdomen/pelvis. 2. Stones or sludge in the gallbladder without gallbladder inflammation. 3. Left adrenal 13 mm nodule, nonspecific, new from prior exam. In the absence of malignancy history, consider follow-up adrenal CT on a  nonemergent basis. If there is history of malignancy, recommend PET/CT. 4. Colonic diverticulosis without diverticulitis. Nonobstructing right renal stone. 5. Multiple small pulmonary nodules in the lower lobes are unchanged from remote prior CT and considered benign. Electronically Signed   By: Rubye Oaks M.D.   On: 03/27/2017 02:31    EKG: Not performed.   Assessment/Plan  1. SIRS; nausea, vomiting, RUQ pain  - Pt presents with 4-5 days of N/V, chills, and headache; she is found to be febrile with marked leukocytosis and normal lactate  - No dyspnea or infiltrate on CXR; no urinary sxs; no meningismus; no wounds or rash  - Influenza testing negative in ED, CT abd/pelvis does not reveal etiology  - Blood and urine cultures were collected in ED, she was given NS bolus, and treated with empiric Zosyn  - Plan to check RUQ [place], continue IVF hydration, continue antiemetics, follow cultures; holding further abx for now    2. Type II DM  - No A1c on file  - Managed  with metformin only at home, held on admission  - Check CBG's and start a SSI with Novolog    3. Hyponatremia  - Serum sodium is 130 on admission in setting of hypovolemia  - She was treated with 1.5 liters NS in ED and will be continued on NS infusion  - Repeat chem panel in am    4. Thrombocytopenia  - Platelet count 126,000 on admission  - No bleeding evident, no prior available for comparison  - Possibly secondary to acute infection  - Repeat CBC in am    5. Adrenal nodule  - 13 mm left adrenal nodule noted incidentally on CT, not seen on prior study  - Per radiologist, "in the absence of malignancy history, consider follow-up adrenal CT on a nonemergent basis. If there is history of malignancy, recommend PET/CT"   DVT prophylaxis: Lovenox  Code Status: Full  Family Communication: Discussed with patient Disposition Plan: Observe on med-surg Consults called: None Admission status: Observation    Briscoe Deutscherimothy S  Opyd, MD [facility] Pager [phone]  If 7PM-7AM, please contact night-coverage [email or web address] Password [email or web address]  03/27/2017, 5:06 AM

## 2017-03-27 NOTE — Progress Notes (Signed)
Patient admitted to the hospital earlier this morning by Dr. Antionette Charpyd.  Patient seen and examined.  Continues to complain of right-sided abdominal pain, nausea and vomiting.  Continues to have frontal headache which she describes as a migraine.  She has a long history of this.  Although it does not appear that she had recurrent fever, she appears acutely ill and is diaphoretic.  She is tender to palpation in the right upper quadrant as well as epigastrium.  Patient admitted with fever, leukocytosis.  Unclear etiology but suspect gallbladder.  Imaging shows small layering gallstones in gallbladder without evidence of gallbladder wall thickening.  She has a white count of 24,000 on admission.  We will start the patient empirically on ampicillin.  Urinalysis and chest x-ray been unrevealing.  No other pathology noted on CT abdomen.  Will request general surgery evaluation.  Darden RestaurantsJehanzeb Angelissa Leblanc

## 2017-03-28 DIAGNOSIS — K8000 Calculus of gallbladder with acute cholecystitis without obstruction: Secondary | ICD-10-CM

## 2017-03-28 LAB — HIV ANTIBODY (ROUTINE TESTING W REFLEX): HIV Screen 4th Generation wRfx: NONREACTIVE

## 2017-03-28 LAB — URINE CULTURE

## 2017-03-28 LAB — COMPREHENSIVE METABOLIC PANEL
ALBUMIN: 2.7 g/dL — AB (ref 3.5–5.0)
ALT: 11 U/L — ABNORMAL LOW (ref 14–54)
ALT: 12 U/L — ABNORMAL LOW (ref 14–54)
ANION GAP: 7 (ref 5–15)
AST: 14 U/L — AB (ref 15–41)
AST: 16 U/L (ref 15–41)
Albumin: 2.7 g/dL — ABNORMAL LOW (ref 3.5–5.0)
Alkaline Phosphatase: 101 U/L (ref 38–126)
Alkaline Phosphatase: 107 U/L (ref 38–126)
Anion gap: 7 (ref 5–15)
BUN: 8 mg/dL (ref 6–20)
BUN: 7 mg/dL (ref 6–20)
CHLORIDE: 103 mmol/L (ref 101–111)
CO2: 27 mmol/L (ref 22–32)
CO2: 27 mmol/L (ref 22–32)
Calcium: 8.9 mg/dL (ref 8.9–10.3)
Calcium: 9.3 mg/dL (ref 8.9–10.3)
Chloride: 103 mmol/L (ref 101–111)
Creatinine, Ser: 0.37 mg/dL — ABNORMAL LOW (ref 0.44–1.00)
Creatinine, Ser: 0.31 mg/dL — ABNORMAL LOW (ref 0.44–1.00)
GFR calc Af Amer: >60 mL/min (ref >60)
GFR calc non Af Amer: >60 mL/min (ref >60)
GLUCOSE: 118 mg/dL — AB (ref 65–99)
Glucose, Bld: 134 mg/dL — ABNORMAL HIGH (ref 65–99)
POTASSIUM: 3.5 mmol/L (ref 3.5–5.1)
POTASSIUM: 3.5 mmol/L (ref 3.5–5.1)
SODIUM: 137 mmol/L (ref 135–145)
SODIUM: 137 mmol/L (ref 135–145)
Total Bilirubin: 0.5 mg/dL (ref 0.3–1.2)
Total Bilirubin: 0.6 mg/dL (ref 0.3–1.2)
Total Protein: 5.8 g/dL — ABNORMAL LOW (ref 6.5–8.1)
Total Protein: 6.2 g/dL — ABNORMAL LOW (ref 6.5–8.1)

## 2017-03-28 LAB — SURGICAL PCR SCREEN
MRSA, PCR: NEGATIVE
STAPHYLOCOCCUS AUREUS: POSITIVE — AB

## 2017-03-28 LAB — CBC WITH DIFFERENTIAL/PLATELET
BASOS ABS: 0 10*3/uL (ref 0.0–0.1)
BASOS PCT: 0 %
EOS ABS: 0.5 10*3/uL (ref 0.0–0.7)
Eosinophils Relative: 3 %
HCT: 39.8 % (ref 36.0–46.0)
Hemoglobin: 12.8 g/dL (ref 12.0–15.0)
Lymphocytes Relative: 12 %
Lymphs Abs: 2.2 10*3/uL (ref 0.7–4.0)
MCH: 29.8 pg (ref 26.0–34.0)
MCHC: 32.2 g/dL (ref 30.0–36.0)
MCV: 92.8 fL (ref 78.0–100.0)
MONO ABS: 1.7 10*3/uL — AB (ref 0.1–1.0)
MONOS PCT: 9 %
NEUTROS ABS: 14.4 10*3/uL — AB (ref 1.7–7.7)
NEUTROS PCT: 76 %
Platelets: 166 10*3/uL (ref 150–400)
RBC: 4.29 MIL/uL (ref 3.87–5.11)
RDW: 12.7 % (ref 11.5–15.5)
WBC: 18.7 10*3/uL — ABNORMAL HIGH (ref 4.0–10.5)

## 2017-03-28 LAB — GLUCOSE, CAPILLARY
GLUCOSE-CAPILLARY: 112 mg/dL — AB (ref 65–99)
GLUCOSE-CAPILLARY: 118 mg/dL — AB (ref 65–99)
GLUCOSE-CAPILLARY: 108 mg/dL — AB (ref 65–99)
GLUCOSE-CAPILLARY: 91 mg/dL (ref 65–99)
Glucose-Capillary: 106 mg/dL — ABNORMAL HIGH (ref 65–99)

## 2017-03-28 MED ORDER — PIPERACILLIN-TAZOBACTAM 3.375 G IVPB
3.3750 g | Freq: Three times a day (TID) | INTRAVENOUS | Status: DC
  Administered 2017-03-28 – 2017-03-30 (×5): 3.375 g via INTRAVENOUS
  Filled 2017-03-28 (×5): qty 50

## 2017-03-28 MED ORDER — SUMATRIPTAN SUCCINATE 6 MG/0.5ML ~~LOC~~ SOLN: 6.0000 mg | SUBCUTANEOUS | Status: DC | PRN

## 2017-03-28 MED ORDER — CHLORHEXIDINE GLUCONATE CLOTH 2 % EX PADS
6.0000 | MEDICATED_PAD | Freq: Once | CUTANEOUS | Status: AC
  Administered 2017-03-28: 6 via TOPICAL

## 2017-03-28 MED ORDER — CHLORHEXIDINE GLUCONATE CLOTH 2 % EX PADS
6.0000 | MEDICATED_PAD | Freq: Every day | CUTANEOUS | Status: DC
  Administered 2017-03-28 – 2017-03-30 (×3): 6 via TOPICAL

## 2017-03-28 MED ORDER — SIMETHICONE 80 MG PO CHEW
80.0000 mg | CHEWABLE_TABLET | Freq: Four times a day (QID) | ORAL | Status: DC | PRN
  Filled 2017-03-28: qty 1

## 2017-03-28 MED ORDER — SUMATRIPTAN SUCCINATE 6 MG/0.5ML ~~LOC~~ SOLN
6.0000 mg | SUBCUTANEOUS | Status: DC | PRN
  Administered 2017-03-28: 6 mg via SUBCUTANEOUS
  Filled 2017-03-28 (×2): qty 0.5

## 2017-03-28 MED ORDER — CHLORHEXIDINE GLUCONATE CLOTH 2 % EX PADS
6.0000 | MEDICATED_PAD | Freq: Once | CUTANEOUS | Status: AC
  Administered 2017-03-29: 6 via TOPICAL

## 2017-03-28 MED ORDER — POLYETHYLENE GLYCOL 3350 17 G PO PACK
17.0000 g | PACK | Freq: Every day | ORAL | Status: DC
  Administered 2017-03-30: 17 g via ORAL
  Filled 2017-03-28 (×3): qty 1

## 2017-03-28 MED ORDER — PIPERACILLIN-TAZOBACTAM 3.375 G IVPB 30 MIN: 3.3750 g | Freq: Four times a day (QID) | INTRAVENOUS | Status: DC

## 2017-03-28 MED ORDER — MUPIROCIN 2 % EX OINT
1.0000 "application " | TOPICAL_OINTMENT | Freq: Two times a day (BID) | CUTANEOUS | Status: DC
  Administered 2017-03-28 – 2017-03-30 (×4): 1 via NASAL
  Filled 2017-03-28: qty 22

## 2017-03-28 NOTE — Plan of Care (Signed)
  Pain Managment: General experience of comfort will improve 03/28/2017 1448 - Progressing by Neomia Dearogers, Avien Taha M, RN Note Patient reports decrease in migraine pain after receiving imitrex as ordered.    Nutrition: Adequate nutrition will be maintained 03/28/2017 1448 - Not Progressing by Neomia Dearogers, Ananda Caya M, RN Note Patient reports nausea and abdominal discomfort after attempting to eat. Zofran given as ordered. See MAR. States some relief with zofran.

## 2017-03-28 NOTE — Progress Notes (Signed)
Education provided regarding surgical PCR. Pre-procedure checklist initiated. Informed consent on chart, pt states MD addressed all of her questions. Instructed to notify nursing staff of any further questions for MD. Earnstine RegalAshley Edyn Qazi, RN

## 2017-03-28 NOTE — Progress Notes (Signed)
Patient requested "imitrex for migraine". Text paged Dr. Kerry HoughMemon to notify of request. Earnstine RegalAshley Candice Lunney, RN

## 2017-03-28 NOTE — H&P (View-Only) (Signed)
[facility] Consult  Reason for Consult: Acute Cholecystitis? RUQ pain  Referring Physician: Dr. Roderic Palau   Chief Complaint    Emesis; Headache      Maria Leblanc is a 61 y.o. female.  HPI: Maria Leblanc is a 61 yo who presented to the ED with nausea/vomiting, headache and RUQ pain.  She was found to have a significant leukocytosis to 24 and underwent a CT and [place] to evaluate for etiology.  She was noted to have stones in her gallbladder but no true fluid or inflammation noted on the imaging. She is diabetic on metformin, and otherwise denies any prior history of any abdominal pain consistent with biliary colic.  She says she has been feeling poorly with the RUQ pain for over 4-5 days. She denies any NSAID, Aleve, BC powder use, and uses acetaminophen for her migraine headaches. She has not been prescribed stronger medications as an outpatient.   Past Medical History:  Diagnosis Date  . Ankle fracture 02/18/2016   left  . Borderline diabetes   . Diverticulosis   . Headache   . History of kidney stones   . Hypothyroid     Past Surgical History:  Procedure Laterality Date  . CYST REMOVAL NECK     x3  . DIAGNOSTIC LAPAROSCOPY     as teenager  . ORIF ANKLE FRACTURE Left 02/28/2016   Procedure: OPEN REDUCTION INTERNAL FIXATION (ORIF) ANKLE FRACTURE, LEFT;  Surgeon: Renette Butters, MD;  Location: [facility];  Service: Orthopedics;  Laterality: Left;  . TONSILLECTOMY     as child   History reviewed. No pertinent family history.  Social History   Tobacco Use  . Smoking status: Current Every Day Smoker    Packs/day: 0.50    Years: 20.00    Pack years: 10.00    Types: Cigarettes  . Smokeless tobacco: Never Used  Substance Use Topics  . Alcohol use: Yes    Comment: rare use of liquor 3 times yearly   . Drug use: No    Medications:  I have reviewed the patient's current medications. Prior to Admission:  Medications Prior to Admission   Medication Sig Dispense Refill Last Dose  . ibuprofen (ADVIL,MOTRIN) 200 MG tablet Take 400 mg by mouth every 6 (six) hours as needed for mild pain or moderate pain.   03/26/2017 at Unknown time  . metFORMIN (GLUCOPHAGE) 500 MG tablet Take 1 tablet (500 mg total) by mouth 2 (two) times daily with a meal. (Patient not taking: Reported on 03/26/2017) 60 tablet 0 Not Taking at Unknown time   Scheduled: . enoxaparin (LOVENOX) injection  40 mg Subcutaneous Q24H  . insulin aspart  0-9 Units Subcutaneous Q4H   Continuous: . sodium chloride 100 mL/hr at 03/28/17 0028  . ampicillin-sulbactam (UNASYN) IV 3 g (03/28/17 0526)   Allergies: No Known Allergies  ROS:  A comprehensive review of systems was negative except for: Constitutional: positive for anorexia, chills and fevers Gastrointestinal: positive for abdominal pain, nausea and vomiting Neurological: positive for headaches fungal nail infections of right thumb and left great toe  Blood pressure 137/72, pulse 83, temperature 99.8 F (37.7 C), temperature source Oral, resp. rate 18, height 5' 5"  (1.651 m), weight 168 lb (76.2 kg), SpO2 92 %. Physical Exam  Constitutional: She is oriented to person, place, and time and well-developed, well-nourished, and in no distress.  HENT:  Head: Normocephalic.  Eyes: Pupils are equal, round, and reactive to light.  Cardiovascular: Normal rate  and regular rhythm.  Pulmonary/Chest: Effort normal and breath sounds normal.  Abdominal: Soft. She exhibits no distension. There is tenderness. There is no rebound and no guarding.  RUQ Tenderness to light palpation  Musculoskeletal: Normal range of motion.  Neurological: She is alert and oriented to person, place, and time.  Skin: Skin is warm and dry.  Psychiatric: Mood, memory, affect and judgment normal.  Vitals reviewed.   Results: Results for orders placed or performed during the hospital encounter of 03/26/17 (from the past 48 hour(s))  CBC with  Differential/Platelet     Status: Abnormal   Collection Time: 03/26/17 11:54 PM  Result Value Ref Range   WBC 24.6 (H) 4.0 - 10.5 K/uL   RBC 4.85 3.87 - 5.11 MIL/uL   Hemoglobin 14.8 12.0 - 15.0 g/dL   HCT 44.4 36.0 - 46.0 %   MCV 91.5 78.0 - 100.0 fL   MCH 30.5 26.0 - 34.0 pg   MCHC 33.3 30.0 - 36.0 g/dL   RDW 12.6 11.5 - 15.5 %   Platelets 126 (L) 150 - 400 K/uL   Neutrophils Relative % 85 %   Neutro Abs 20.9 (H) 1.7 - 7.7 K/uL   Lymphocytes Relative 8 %   Lymphs Abs 1.9 0.7 - 4.0 K/uL   Monocytes Relative 7 %   Monocytes Absolute 1.7 (H) 0.1 - 1.0 K/uL   Eosinophils Relative 0 %   Eosinophils Absolute 0.1 0.0 - 0.7 K/uL   Basophils Relative 0 %   Basophils Absolute 0.0 0.0 - 0.1 K/uL  Comprehensive metabolic panel     Status: Abnormal   Collection Time: 03/26/17 11:54 PM  Result Value Ref Range   Sodium 130 (L) 135 - 145 mmol/L   Potassium 3.6 3.5 - 5.1 mmol/L   Chloride 97 (L) 101 - 111 mmol/L   CO2 24 22 - 32 mmol/L   Glucose, Bld 318 (H) 65 - 99 mg/dL   BUN 10 6 - 20 mg/dL   Creatinine, Ser 0.44 0.44 - 1.00 mg/dL   Calcium 10.0 8.9 - 10.3 mg/dL   Total Protein 7.2 6.5 - 8.1 g/dL   Albumin 3.3 (L) 3.5 - 5.0 g/dL   AST 13 (L) 15 - 41 U/L   ALT 5 (L) 14 - 54 U/L   Alkaline Phosphatase 124 38 - 126 U/L   Total Bilirubin 1.4 (H) 0.3 - 1.2 mg/dL   GFR calc non Af Amer >60 >60 mL/min   GFR calc Af Amer >60 >60 mL/min    Comment: (NOTE) The eGFR has been calculated using the CKD EPI equation. This calculation has not been validated in all clinical situations. eGFR's persistently <60 mL/min signify possible Chronic Kidney Disease.    Anion gap 9 5 - 15  Lipase, blood     Status: None   Collection Time: 03/26/17 11:54 PM  Result Value Ref Range   Lipase 16 11 - 51 U/L  Influenza panel by PCR (type A & B)     Status: None   Collection Time: 03/27/17 12:47 AM  Result Value Ref Range   Influenza A By PCR NEGATIVE NEGATIVE   Influenza B By PCR NEGATIVE NEGATIVE     Comment: (NOTE) The Xpert Xpress Flu assay is intended as an aid in the diagnosis of  influenza and should not be used as a sole basis for treatment.  This  assay is FDA approved for nasopharyngeal swab specimens only. Nasal  washings and aspirates are unacceptable for Xpert Xpress  Flu testing.   Blood culture (routine x 2)     Status: None (Preliminary result)   Collection Time: 03/27/17  1:32 AM  Result Value Ref Range   Specimen Description LEFT ANTECUBITAL    Special Requests      BOTTLES DRAWN AEROBIC AND ANAEROBIC Blood Culture adequate volume   Culture NO GROWTH 1 DAY    Report Status PENDING   Blood culture (routine x 2)     Status: None (Preliminary result)   Collection Time: 03/27/17  1:47 AM  Result Value Ref Range   Specimen Description BLOOD RIGHT HAND    Special Requests      BOTTLES DRAWN AEROBIC AND ANAEROBIC Blood Culture adequate volume   Culture NO GROWTH 1 DAY    Report Status PENDING   I-Stat CG4 Lactic Acid, ED     Status: None   Collection Time: 03/27/17  1:47 AM  Result Value Ref Range   Lactic Acid, Venous 0.75 0.5 - 1.9 mmol/L  Urinalysis, Routine w reflex microscopic     Status: Abnormal   Collection Time: 03/27/17  3:40 AM  Result Value Ref Range   Color, Urine YELLOW YELLOW   APPearance CLEAR CLEAR   Specific Gravity, Urine >1.046 (H) 1.005 - 1.030   pH 5.0 5.0 - 8.0   Glucose, UA >=500 (A) NEGATIVE mg/dL   Hgb urine dipstick NEGATIVE NEGATIVE   Bilirubin Urine NEGATIVE NEGATIVE   Ketones, ur 5 (A) NEGATIVE mg/dL   Protein, ur NEGATIVE NEGATIVE mg/dL   Nitrite NEGATIVE NEGATIVE   Leukocytes, UA NEGATIVE NEGATIVE   RBC / HPF 0-5 0 - 5 RBC/hpf   WBC, UA 0-5 0 - 5 WBC/hpf   Bacteria, UA NONE SEEN NONE SEEN   Squamous Epithelial / LPF 0-5 (A) NONE SEEN   Mucus PRESENT   Urine Culture     Status: Abnormal   Collection Time: 03/27/17  3:40 AM  Result Value Ref Range   Specimen Description URINE, CLEAN CATCH    Special Requests NONE    Culture  MULTIPLE SPECIES PRESENT, SUGGEST RECOLLECTION (A)    Report Status 03/28/2017 FINAL   HIV antibody (Routine Testing)     Status: None   Collection Time: 03/27/17  5:19 AM  Result Value Ref Range   HIV Screen 4th Generation wRfx Non Reactive Non Reactive    Comment: (NOTE) Performed At: [facility] [place], [place] [id number] Rush Farmer MD CV:[id number]   Comprehensive metabolic panel     Status: Abnormal   Collection Time: 03/27/17  5:19 AM  Result Value Ref Range   Sodium 133 (L) 135 - 145 mmol/L   Potassium 3.3 (L) 3.5 - 5.1 mmol/L   Chloride 99 (L) 101 - 111 mmol/L   CO2 28 22 - 32 mmol/L   Glucose, Bld 258 (H) 65 - 99 mg/dL   BUN 10 6 - 20 mg/dL   Creatinine, Ser 0.41 (L) 0.44 - 1.00 mg/dL   Calcium 9.2 8.9 - 10.3 mg/dL   Total Protein 6.2 (L) 6.5 - 8.1 g/dL   Albumin 3.0 (L) 3.5 - 5.0 g/dL   AST 11 (L) 15 - 41 U/L   ALT 11 (L) 14 - 54 U/L   Alkaline Phosphatase 111 38 - 126 U/L   Total Bilirubin 1.3 (H) 0.3 - 1.2 mg/dL   GFR calc non Af Amer >60 >60 mL/min   GFR calc Af Amer >60 >60 mL/min    Comment: (NOTE) The eGFR has  been calculated using the CKD EPI equation. This calculation has not been validated in all clinical situations. eGFR's persistently <60 mL/min signify possible Chronic Kidney Disease.    Anion gap 6 5 - 15  CBC with Differential/Platelet     Status: Abnormal   Collection Time: 03/27/17  5:19 AM  Result Value Ref Range   WBC 20.8 (H) 4.0 - 10.5 K/uL   RBC 4.62 3.87 - 5.11 MIL/uL   Hemoglobin 13.9 12.0 - 15.0 g/dL   HCT 42.8 36.0 - 46.0 %   MCV 92.6 78.0 - 100.0 fL   MCH 30.1 26.0 - 34.0 pg   MCHC 32.5 30.0 - 36.0 g/dL   RDW 12.8 11.5 - 15.5 %   Platelets 176 150 - 400 K/uL   Neutrophils Relative % 83 %   Neutro Abs 17.3 (H) 1.7 - 7.7 K/uL   Lymphocytes Relative 8 %   Lymphs Abs 1.6 0.7 - 4.0 K/uL   Monocytes Relative 8 %   Monocytes Absolute 1.7 (H) 0.1 - 1.0 K/uL   Eosinophils Relative 1 %   Eosinophils  Absolute 0.2 0.0 - 0.7 K/uL   Basophils Relative 0 %   Basophils Absolute 0.0 0.0 - 0.1 K/uL  Troponin I     Status: None   Collection Time: 03/27/17  5:19 AM  Result Value Ref Range   Troponin I <0.03 <0.03 ng/mL  Glucose, capillary     Status: Abnormal   Collection Time: 03/27/17  6:28 AM  Result Value Ref Range   Glucose-Capillary 262 (H) 65 - 99 mg/dL   Comment 1 Notify RN    Comment 2 Document in Chart   Glucose, capillary     Status: Abnormal   Collection Time: 03/27/17  7:31 AM  Result Value Ref Range   Glucose-Capillary 277 (H) 65 - 99 mg/dL   Comment 1 Notify RN    Comment 2 Document in Chart   Glucose, capillary     Status: Abnormal   Collection Time: 03/27/17 11:18 AM  Result Value Ref Range   Glucose-Capillary 274 (H) 65 - 99 mg/dL   Comment 1 Notify RN    Comment 2 Document in Chart   Glucose, capillary     Status: Abnormal   Collection Time: 03/27/17  4:30 PM  Result Value Ref Range   Glucose-Capillary 142 (H) 65 - 99 mg/dL   Comment 1 Notify RN    Comment 2 Document in Chart   Glucose, capillary     Status: Abnormal   Collection Time: 03/27/17  8:13 PM  Result Value Ref Range   Glucose-Capillary 114 (H) 65 - 99 mg/dL  Glucose, capillary     Status: Abnormal   Collection Time: 03/27/17 11:59 PM  Result Value Ref Range   Glucose-Capillary 163 (H) 65 - 99 mg/dL  Glucose, capillary     Status: Abnormal   Collection Time: 03/28/17  4:31 AM  Result Value Ref Range   Glucose-Capillary 106 (H) 65 - 99 mg/dL   Comment 1 Notify RN   CBC with Differential/Platelet     Status: Abnormal   Collection Time: 03/28/17  5:09 AM  Result Value Ref Range   WBC 18.7 (H) 4.0 - 10.5 K/uL   RBC 4.29 3.87 - 5.11 MIL/uL   Hemoglobin 12.8 12.0 - 15.0 g/dL   HCT 39.8 36.0 - 46.0 %   MCV 92.8 78.0 - 100.0 fL   MCH 29.8 26.0 - 34.0 pg   MCHC 32.2 30.0 - 36.0  g/dL   RDW 12.7 11.5 - 15.5 %   Platelets 166 150 - 400 K/uL   Neutrophils Relative % 76 %   Neutro Abs 14.4 (H) 1.7 -  7.7 K/uL   Lymphocytes Relative 12 %   Lymphs Abs 2.2 0.7 - 4.0 K/uL   Monocytes Relative 9 %   Monocytes Absolute 1.7 (H) 0.1 - 1.0 K/uL   Eosinophils Relative 3 %   Eosinophils Absolute 0.5 0.0 - 0.7 K/uL   Basophils Relative 0 %   Basophils Absolute 0.0 0.0 - 0.1 K/uL  Comprehensive metabolic panel     Status: Abnormal   Collection Time: 03/28/17  5:09 AM  Result Value Ref Range   Sodium 137 135 - 145 mmol/L   Potassium 3.5 3.5 - 5.1 mmol/L   Chloride 103 101 - 111 mmol/L   CO2 27 22 - 32 mmol/L   Glucose, Bld 118 (H) 65 - 99 mg/dL   BUN 8 6 - 20 mg/dL   Creatinine, Ser 0.37 (L) 0.44 - 1.00 mg/dL   Calcium 8.9 8.9 - 10.3 mg/dL   Total Protein 5.8 (L) 6.5 - 8.1 g/dL   Albumin 2.7 (L) 3.5 - 5.0 g/dL   AST 14 (L) 15 - 41 U/L   ALT 11 (L) 14 - 54 U/L   Alkaline Phosphatase 101 38 - 126 U/L   Total Bilirubin 0.5 0.3 - 1.2 mg/dL   GFR calc non Af Amer >60 >60 mL/min   GFR calc Af Amer >60 >60 mL/min    Comment: (NOTE) The eGFR has been calculated using the CKD EPI equation. This calculation has not been validated in all clinical situations. eGFR's persistently <60 mL/min signify possible Chronic Kidney Disease.    Anion gap 7 5 - 15  Glucose, capillary     Status: Abnormal   Collection Time: 03/28/17  8:02 AM  Result Value Ref Range   Glucose-Capillary 112 (H) 65 - 99 mg/dL   Comment 1 Notify RN    Comment 2 Document in Chart    Personally reviewed imaging -gallbladder with stones, no significant thickening of fluid, stool in colon, no other identifiable abnormality  Dg Chest 2 View  Result Date: 03/27/2017 CLINICAL DATA:  Cough and fever. EXAM: CHEST  2 VIEW COMPARISON:  Report from radiographs 07/15/2015, images not available. FINDINGS: The cardiomediastinal contours are normal. Chronic elevation of right hemidiaphragm. Minimal streaky left lung base atelectasis. Pulmonary vasculature is normal. No consolidation, pleural effusion, or pneumothorax. No acute osseous  abnormalities are seen. IMPRESSION: Minimal streaky left lung base atelectasis. Chronic elevation of right hemidiaphragm. Electronically Signed   By: Jeb Levering M.D.   On: 03/27/2017 02:09   Ct Abdomen Pelvis W Contrast  Result Date: 03/27/2017 CLINICAL DATA:  Nausea and vomiting.  Chills. EXAM: CT ABDOMEN AND PELVIS WITH CONTRAST TECHNIQUE: Multidetector CT imaging of the abdomen and pelvis was performed using the standard protocol following bolus administration of intravenous contrast. CONTRAST:  185m ISOVUE-300 IOPAMIDOL (ISOVUE-300) INJECTION 61% COMPARISON:  CT 01/14/2004 FINDINGS: Lower chest: Multiple small pulmonary nodules in the both lower lobes are unchanged from prior exam and considered benign. Linear scarring in the right lower lobe. No pleural fluid or consolidation. Hepatobiliary: Multiple hepatic cysts again seen, largest lesion in the right lobe measures 15 mm. Minimal layering sludge or stones in the gallbladder without pericholecystic inflammation. No biliary dilatation. Pancreas: Fatty atrophy.  No ductal dilatation or inflammation. Spleen: Normal in size without focal abnormality. Adrenals/Urinary Tract: 13 low-density left  adrenal nodule, not seen on prior exam. No right adrenal nodule. No hydronephrosis or perinephric edema. Punctate nonobstructing stone in the lower right kidney. Homogeneous renal enhancement with symmetric excretion on delayed phase imaging. Urinary bladder is physiologically distended without wall thickening. Stomach/Bowel: Small hiatal hernia. Stomach is nondistended. No small bowel obstruction, wall thickening or inflammatory change. Normal appendix. Diverticulosis of the distal descending and sigmoid colon. No diverticulitis. No colonic inflammation. Vascular/Lymphatic: Normal caliber abdominal aorta. No enlarged abdominal or pelvic lymph nodes. Reproductive: Uterus and bilateral adnexa are unremarkable. Other: No free air, free fluid, or intra-abdominal  fluid collection. Tiny fat containing umbilical hernia. Musculoskeletal: There are no acute or suspicious osseous abnormalities. IMPRESSION: 1. No acute abnormality in the abdomen/pelvis. 2. Stones or sludge in the gallbladder without gallbladder inflammation. 3. Left adrenal 13 mm nodule, nonspecific, new from prior exam. In the absence of malignancy history, consider follow-up adrenal CT on a nonemergent basis. If there is history of malignancy, recommend PET/CT. 4. Colonic diverticulosis without diverticulitis. Nonobstructing right renal stone. 5. Multiple small pulmonary nodules in the lower lobes are unchanged from remote prior CT and considered benign. Electronically Signed   By: Jeb Levering M.D.   On: 03/27/2017 02:31   US Abdomen Limited Ruq  Result Date: 03/27/2017 CLINICAL DATA:  Right upper quadrant pain EXAM: ULTRASOUND ABDOMEN LIMITED RIGHT UPPER QUADRANT COMPARISON:  03/27/2017 CT FINDINGS: Gallbladder: Multiple small layering stones. No wall thickening or sonographic Murphy's. Common bile duct: Diameter: Normal caliber, 4 mm Liver: No focal lesion identified. Within normal limits in parenchymal echogenicity. Portal vein is patent on color Doppler imaging with normal direction of blood flow towards the liver. IMPRESSION: Multiple small layering stones.  No evidence of acute cholecystitis. Electronically Signed   By: Rolm Baptise M.D.   On: 03/27/2017 11:04   Assessment & Plan:  MARCEDES TECH is a 61 y.o. female with RUQ pain, WBC, and mildly elevated transaminese and T bili on her admission without clear evidence on imaging of acute cholecystitis, but the patient is diabetic and I have seen irregular and inconsistent imaging with patients similar to her in the past.  Given her RUQ pain and the leukocytosis and no additional etiology, I think there is a high likelihood of cholecystitis.  UA negative, no signs of PNA, Blood cultures without growth.  I have discussed with the patient the  option of a HIDA scan and seeing if this is cholecystitis versus undergoing a laparoscopic cholecystectomy and exploration to identify other etiologies if the gallbladder does not appear infected. Discussed that the gallbladder may not be causing the infection, and that if we identified another source intraabdominally that needed surgical intervention that we would proceed with this and that this could mean a larger incision.    After a lengthy discussion the patient would like to undergo a laparoscopic cholecystectomy and exploration tomorrow and forego the HIDA scan.   PLAN: I counseled the patient about the indication, risks and benefits of laparoscopic cholecystectomy.  She understands there is a very small chance for bleeding, infection, injury to normal structures (including common bile duct), conversion to open surgery, persistent symptoms, evolution of postcholecystectomy diarrhea, need for secondary interventions, anesthesia reaction, cardiopulmonary issues and other risks not specifically detailed here. I described the expected recovery, the plan for follow-up and the restrictions during the recovery phase.  All questions were answered.  -NPO midnight  -Unasyn switched to zosyn  -Case posted for tomorrow, likely will be after lunch  -CXR wnl,  EKG sinus rhythm, no cardiac issues   All questions were answered to the satisfaction of the patient.   Virl Cagey 03/28/2017, 10:49 AM

## 2017-03-28 NOTE — Consult Note (Addendum)
[facility] Consult  Reason for Consult: Acute Cholecystitis? RUQ pain  Referring Physician: Dr. Roderic Palau   Chief Complaint    Emesis; Headache      Maria Leblanc is a 61 y.o. female.  HPI: Maria Leblanc is a 61 yo who presented to the ED with nausea/vomiting, headache and RUQ pain.  She was found to have a significant leukocytosis to 24 and underwent a CT and [place] to evaluate for etiology.  She was noted to have stones in her gallbladder but no true fluid or inflammation noted on the imaging. She is diabetic on metformin, and otherwise denies any prior history of any abdominal pain consistent with biliary colic.  She says she has been feeling poorly with the RUQ pain for over 4-5 days. She denies any NSAID, Aleve, BC powder use, and uses acetaminophen for her migraine headaches. She has not been prescribed stronger medications as an outpatient.   Past Medical History:  Diagnosis Date  . Ankle fracture 02/18/2016   left  . Borderline diabetes   . Diverticulosis   . Headache   . History of kidney stones   . Hypothyroid     Past Surgical History:  Procedure Laterality Date  . CYST REMOVAL NECK     x3  . DIAGNOSTIC LAPAROSCOPY     as teenager  . ORIF ANKLE FRACTURE Left 02/28/2016   Procedure: OPEN REDUCTION INTERNAL FIXATION (ORIF) ANKLE FRACTURE, LEFT;  Surgeon: Renette Butters, MD;  Location: [facility];  Service: Orthopedics;  Laterality: Left;  . TONSILLECTOMY     as child   History reviewed. No pertinent family history.  Social History   Tobacco Use  . Smoking status: Current Every Day Smoker    Packs/day: 0.50    Years: 20.00    Pack years: 10.00    Types: Cigarettes  . Smokeless tobacco: Never Used  Substance Use Topics  . Alcohol use: Yes    Comment: rare use of liquor 3 times yearly   . Drug use: No    Medications:  I have reviewed the patient's current medications. Prior to Admission:  Medications Prior to Admission   Medication Sig Dispense Refill Last Dose  . ibuprofen (ADVIL,MOTRIN) 200 MG tablet Take 400 mg by mouth every 6 (six) hours as needed for mild pain or moderate pain.   03/26/2017 at Unknown time  . metFORMIN (GLUCOPHAGE) 500 MG tablet Take 1 tablet (500 mg total) by mouth 2 (two) times daily with a meal. (Patient not taking: Reported on 03/26/2017) 60 tablet 0 Not Taking at Unknown time   Scheduled: . enoxaparin (LOVENOX) injection  40 mg Subcutaneous Q24H  . insulin aspart  0-9 Units Subcutaneous Q4H   Continuous: . sodium chloride 100 mL/hr at 03/28/17 0028  . ampicillin-sulbactam (UNASYN) IV 3 g (03/28/17 0526)   Allergies: No Known Allergies  ROS:  A comprehensive review of systems was negative except for: Constitutional: positive for anorexia, chills and fevers Gastrointestinal: positive for abdominal pain, nausea and vomiting Neurological: positive for headaches fungal nail infections of right thumb and left great toe  Blood pressure 137/72, pulse 83, temperature 99.8 F (37.7 C), temperature source Oral, resp. rate 18, height 5' 5"  (1.651 m), weight 168 lb (76.2 kg), SpO2 92 %. Physical Exam  Constitutional: She is oriented to person, place, and time and well-developed, well-nourished, and in no distress.  HENT:  Head: Normocephalic.  Eyes: Pupils are equal, round, and reactive to light.  Cardiovascular: Normal rate  and regular rhythm.  Pulmonary/Chest: Effort normal and breath sounds normal.  Abdominal: Soft. She exhibits no distension. There is tenderness. There is no rebound and no guarding.  RUQ Tenderness to light palpation  Musculoskeletal: Normal range of motion.  Neurological: She is alert and oriented to person, place, and time.  Skin: Skin is warm and dry.  Psychiatric: Mood, memory, affect and judgment normal.  Vitals reviewed.   Results: Results for orders placed or performed during the hospital encounter of 03/26/17 (from the past 48 hour(s))  CBC with  Differential/Platelet     Status: Abnormal   Collection Time: 03/26/17 11:54 PM  Result Value Ref Range   WBC 24.6 (H) 4.0 - 10.5 K/uL   RBC 4.85 3.87 - 5.11 MIL/uL   Hemoglobin 14.8 12.0 - 15.0 g/dL   HCT 44.4 36.0 - 46.0 %   MCV 91.5 78.0 - 100.0 fL   MCH 30.5 26.0 - 34.0 pg   MCHC 33.3 30.0 - 36.0 g/dL   RDW 12.6 11.5 - 15.5 %   Platelets 126 (L) 150 - 400 K/uL   Neutrophils Relative % 85 %   Neutro Abs 20.9 (H) 1.7 - 7.7 K/uL   Lymphocytes Relative 8 %   Lymphs Abs 1.9 0.7 - 4.0 K/uL   Monocytes Relative 7 %   Monocytes Absolute 1.7 (H) 0.1 - 1.0 K/uL   Eosinophils Relative 0 %   Eosinophils Absolute 0.1 0.0 - 0.7 K/uL   Basophils Relative 0 %   Basophils Absolute 0.0 0.0 - 0.1 K/uL  Comprehensive metabolic panel     Status: Abnormal   Collection Time: 03/26/17 11:54 PM  Result Value Ref Range   Sodium 130 (L) 135 - 145 mmol/L   Potassium 3.6 3.5 - 5.1 mmol/L   Chloride 97 (L) 101 - 111 mmol/L   CO2 24 22 - 32 mmol/L   Glucose, Bld 318 (H) 65 - 99 mg/dL   BUN 10 6 - 20 mg/dL   Creatinine, Ser 0.44 0.44 - 1.00 mg/dL   Calcium 10.0 8.9 - 10.3 mg/dL   Total Protein 7.2 6.5 - 8.1 g/dL   Albumin 3.3 (L) 3.5 - 5.0 g/dL   AST 13 (L) 15 - 41 U/L   ALT 5 (L) 14 - 54 U/L   Alkaline Phosphatase 124 38 - 126 U/L   Total Bilirubin 1.4 (H) 0.3 - 1.2 mg/dL   GFR calc non Af Amer >60 >60 mL/min   GFR calc Af Amer >60 >60 mL/min    Comment: (NOTE) The eGFR has been calculated using the CKD EPI equation. This calculation has not been validated in all clinical situations. eGFR's persistently <60 mL/min signify possible Chronic Kidney Disease.    Anion gap 9 5 - 15  Lipase, blood     Status: None   Collection Time: 03/26/17 11:54 PM  Result Value Ref Range   Lipase 16 11 - 51 U/L  Influenza panel by PCR (type A & B)     Status: None   Collection Time: 03/27/17 12:47 AM  Result Value Ref Range   Influenza A By PCR NEGATIVE NEGATIVE   Influenza B By PCR NEGATIVE NEGATIVE     Comment: (NOTE) The Xpert Xpress Flu assay is intended as an aid in the diagnosis of  influenza and should not be used as a sole basis for treatment.  This  assay is FDA approved for nasopharyngeal swab specimens only. Nasal  washings and aspirates are unacceptable for Xpert Xpress  Flu testing.   Blood culture (routine x 2)     Status: None (Preliminary result)   Collection Time: 03/27/17  1:32 AM  Result Value Ref Range   Specimen Description LEFT ANTECUBITAL    Special Requests      BOTTLES DRAWN AEROBIC AND ANAEROBIC Blood Culture adequate volume   Culture NO GROWTH 1 DAY    Report Status PENDING   Blood culture (routine x 2)     Status: None (Preliminary result)   Collection Time: 03/27/17  1:47 AM  Result Value Ref Range   Specimen Description BLOOD RIGHT HAND    Special Requests      BOTTLES DRAWN AEROBIC AND ANAEROBIC Blood Culture adequate volume   Culture NO GROWTH 1 DAY    Report Status PENDING   I-Stat CG4 Lactic Acid, ED     Status: None   Collection Time: 03/27/17  1:47 AM  Result Value Ref Range   Lactic Acid, Venous 0.75 0.5 - 1.9 mmol/L  Urinalysis, Routine w reflex microscopic     Status: Abnormal   Collection Time: 03/27/17  3:40 AM  Result Value Ref Range   Color, Urine YELLOW YELLOW   APPearance CLEAR CLEAR   Specific Gravity, Urine >1.046 (H) 1.005 - 1.030   pH 5.0 5.0 - 8.0   Glucose, UA >=500 (A) NEGATIVE mg/dL   Hgb urine dipstick NEGATIVE NEGATIVE   Bilirubin Urine NEGATIVE NEGATIVE   Ketones, ur 5 (A) NEGATIVE mg/dL   Protein, ur NEGATIVE NEGATIVE mg/dL   Nitrite NEGATIVE NEGATIVE   Leukocytes, UA NEGATIVE NEGATIVE   RBC / HPF 0-5 0 - 5 RBC/hpf   WBC, UA 0-5 0 - 5 WBC/hpf   Bacteria, UA NONE SEEN NONE SEEN   Squamous Epithelial / LPF 0-5 (A) NONE SEEN   Mucus PRESENT   Urine Culture     Status: Abnormal   Collection Time: 03/27/17  3:40 AM  Result Value Ref Range   Specimen Description URINE, CLEAN CATCH    Special Requests NONE    Culture  MULTIPLE SPECIES PRESENT, SUGGEST RECOLLECTION (A)    Report Status 03/28/2017 FINAL   HIV antibody (Routine Testing)     Status: None   Collection Time: 03/27/17  5:19 AM  Result Value Ref Range   HIV Screen 4th Generation wRfx Non Reactive Non Reactive    Comment: (NOTE) Performed At: [facility] [place], [place] [id number] Rush Farmer MD [id number]   Comprehensive metabolic panel     Status: Abnormal   Collection Time: 03/27/17  5:19 AM  Result Value Ref Range   Sodium 133 (L) 135 - 145 mmol/L   Potassium 3.3 (L) 3.5 - 5.1 mmol/L   Chloride 99 (L) 101 - 111 mmol/L   CO2 28 22 - 32 mmol/L   Glucose, Bld 258 (H) 65 - 99 mg/dL   BUN 10 6 - 20 mg/dL   Creatinine, Ser 0.41 (L) 0.44 - 1.00 mg/dL   Calcium 9.2 8.9 - 10.3 mg/dL   Total Protein 6.2 (L) 6.5 - 8.1 g/dL   Albumin 3.0 (L) 3.5 - 5.0 g/dL   AST 11 (L) 15 - 41 U/L   ALT 11 (L) 14 - 54 U/L   Alkaline Phosphatase 111 38 - 126 U/L   Total Bilirubin 1.3 (H) 0.3 - 1.2 mg/dL   GFR calc non Af Amer >60 >60 mL/min   GFR calc Af Amer >60 >60 mL/min    Comment: (NOTE) The eGFR has  been calculated using the CKD EPI equation. This calculation has not been validated in all clinical situations. eGFR's persistently <60 mL/min signify possible Chronic Kidney Disease.    Anion gap 6 5 - 15  CBC with Differential/Platelet     Status: Abnormal   Collection Time: 03/27/17  5:19 AM  Result Value Ref Range   WBC 20.8 (H) 4.0 - 10.5 K/uL   RBC 4.62 3.87 - 5.11 MIL/uL   Hemoglobin 13.9 12.0 - 15.0 g/dL   HCT 42.8 36.0 - 46.0 %   MCV 92.6 78.0 - 100.0 fL   MCH 30.1 26.0 - 34.0 pg   MCHC 32.5 30.0 - 36.0 g/dL   RDW 12.8 11.5 - 15.5 %   Platelets 176 150 - 400 K/uL   Neutrophils Relative % 83 %   Neutro Abs 17.3 (H) 1.7 - 7.7 K/uL   Lymphocytes Relative 8 %   Lymphs Abs 1.6 0.7 - 4.0 K/uL   Monocytes Relative 8 %   Monocytes Absolute 1.7 (H) 0.1 - 1.0 K/uL   Eosinophils Relative 1 %   Eosinophils  Absolute 0.2 0.0 - 0.7 K/uL   Basophils Relative 0 %   Basophils Absolute 0.0 0.0 - 0.1 K/uL  Troponin I     Status: None   Collection Time: 03/27/17  5:19 AM  Result Value Ref Range   Troponin I <0.03 <0.03 ng/mL  Glucose, capillary     Status: Abnormal   Collection Time: 03/27/17  6:28 AM  Result Value Ref Range   Glucose-Capillary 262 (H) 65 - 99 mg/dL   Comment 1 Notify RN    Comment 2 Document in Chart   Glucose, capillary     Status: Abnormal   Collection Time: 03/27/17  7:31 AM  Result Value Ref Range   Glucose-Capillary 277 (H) 65 - 99 mg/dL   Comment 1 Notify RN    Comment 2 Document in Chart   Glucose, capillary     Status: Abnormal   Collection Time: 03/27/17 11:18 AM  Result Value Ref Range   Glucose-Capillary 274 (H) 65 - 99 mg/dL   Comment 1 Notify RN    Comment 2 Document in Chart   Glucose, capillary     Status: Abnormal   Collection Time: 03/27/17  4:30 PM  Result Value Ref Range   Glucose-Capillary 142 (H) 65 - 99 mg/dL   Comment 1 Notify RN    Comment 2 Document in Chart   Glucose, capillary     Status: Abnormal   Collection Time: 03/27/17  8:13 PM  Result Value Ref Range   Glucose-Capillary 114 (H) 65 - 99 mg/dL  Glucose, capillary     Status: Abnormal   Collection Time: 03/27/17 11:59 PM  Result Value Ref Range   Glucose-Capillary 163 (H) 65 - 99 mg/dL  Glucose, capillary     Status: Abnormal   Collection Time: 03/28/17  4:31 AM  Result Value Ref Range   Glucose-Capillary 106 (H) 65 - 99 mg/dL   Comment 1 Notify RN   CBC with Differential/Platelet     Status: Abnormal   Collection Time: 03/28/17  5:09 AM  Result Value Ref Range   WBC 18.7 (H) 4.0 - 10.5 K/uL   RBC 4.29 3.87 - 5.11 MIL/uL   Hemoglobin 12.8 12.0 - 15.0 g/dL   HCT 39.8 36.0 - 46.0 %   MCV 92.8 78.0 - 100.0 fL   MCH 29.8 26.0 - 34.0 pg   MCHC 32.2 30.0 - 36.0  g/dL   RDW 12.7 11.5 - 15.5 %   Platelets 166 150 - 400 K/uL   Neutrophils Relative % 76 %   Neutro Abs 14.4 (H) 1.7 -  7.7 K/uL   Lymphocytes Relative 12 %   Lymphs Abs 2.2 0.7 - 4.0 K/uL   Monocytes Relative 9 %   Monocytes Absolute 1.7 (H) 0.1 - 1.0 K/uL   Eosinophils Relative 3 %   Eosinophils Absolute 0.5 0.0 - 0.7 K/uL   Basophils Relative 0 %   Basophils Absolute 0.0 0.0 - 0.1 K/uL  Comprehensive metabolic panel     Status: Abnormal   Collection Time: 03/28/17  5:09 AM  Result Value Ref Range   Sodium 137 135 - 145 mmol/L   Potassium 3.5 3.5 - 5.1 mmol/L   Chloride 103 101 - 111 mmol/L   CO2 27 22 - 32 mmol/L   Glucose, Bld 118 (H) 65 - 99 mg/dL   BUN 8 6 - 20 mg/dL   Creatinine, Ser 0.37 (L) 0.44 - 1.00 mg/dL   Calcium 8.9 8.9 - 10.3 mg/dL   Total Protein 5.8 (L) 6.5 - 8.1 g/dL   Albumin 2.7 (L) 3.5 - 5.0 g/dL   AST 14 (L) 15 - 41 U/L   ALT 11 (L) 14 - 54 U/L   Alkaline Phosphatase 101 38 - 126 U/L   Total Bilirubin 0.5 0.3 - 1.2 mg/dL   GFR calc non Af Amer >60 >60 mL/min   GFR calc Af Amer >60 >60 mL/min    Comment: (NOTE) The eGFR has been calculated using the CKD EPI equation. This calculation has not been validated in all clinical situations. eGFR's persistently <60 mL/min signify possible Chronic Kidney Disease.    Anion gap 7 5 - 15  Glucose, capillary     Status: Abnormal   Collection Time: 03/28/17  8:02 AM  Result Value Ref Range   Glucose-Capillary 112 (H) 65 - 99 mg/dL   Comment 1 Notify RN    Comment 2 Document in Chart    Personally reviewed imaging -gallbladder with stones, no significant thickening of fluid, stool in colon, no other identifiable abnormality  Dg Chest 2 View  Result Date: 03/27/2017 CLINICAL DATA:  Cough and fever. EXAM: CHEST  2 VIEW COMPARISON:  Report from radiographs 07/15/2015, images not available. FINDINGS: The cardiomediastinal contours are normal. Chronic elevation of right hemidiaphragm. Minimal streaky left lung base atelectasis. Pulmonary vasculature is normal. No consolidation, pleural effusion, or pneumothorax. No acute osseous  abnormalities are seen. IMPRESSION: Minimal streaky left lung base atelectasis. Chronic elevation of right hemidiaphragm. Electronically Signed   By: Jeb Levering M.D.   On: 03/27/2017 02:09   Ct Abdomen Pelvis W Contrast  Result Date: 03/27/2017 CLINICAL DATA:  Nausea and vomiting.  Chills. EXAM: CT ABDOMEN AND PELVIS WITH CONTRAST TECHNIQUE: Multidetector CT imaging of the abdomen and pelvis was performed using the standard protocol following bolus administration of intravenous contrast. CONTRAST:  193m ISOVUE-300 IOPAMIDOL (ISOVUE-300) INJECTION 61% COMPARISON:  CT 01/14/2004 FINDINGS: Lower chest: Multiple small pulmonary nodules in the both lower lobes are unchanged from prior exam and considered benign. Linear scarring in the right lower lobe. No pleural fluid or consolidation. Hepatobiliary: Multiple hepatic cysts again seen, largest lesion in the right lobe measures 15 mm. Minimal layering sludge or stones in the gallbladder without pericholecystic inflammation. No biliary dilatation. Pancreas: Fatty atrophy.  No ductal dilatation or inflammation. Spleen: Normal in size without focal abnormality. Adrenals/Urinary Tract: 13 low-density left  adrenal nodule, not seen on prior exam. No right adrenal nodule. No hydronephrosis or perinephric edema. Punctate nonobstructing stone in the lower right kidney. Homogeneous renal enhancement with symmetric excretion on delayed phase imaging. Urinary bladder is physiologically distended without wall thickening. Stomach/Bowel: Small hiatal hernia. Stomach is nondistended. No small bowel obstruction, wall thickening or inflammatory change. Normal appendix. Diverticulosis of the distal descending and sigmoid colon. No diverticulitis. No colonic inflammation. Vascular/Lymphatic: Normal caliber abdominal aorta. No enlarged abdominal or pelvic lymph nodes. Reproductive: Uterus and bilateral adnexa are unremarkable. Other: No free air, free fluid, or intra-abdominal  fluid collection. Tiny fat containing umbilical hernia. Musculoskeletal: There are no acute or suspicious osseous abnormalities. IMPRESSION: 1. No acute abnormality in the abdomen/pelvis. 2. Stones or sludge in the gallbladder without gallbladder inflammation. 3. Left adrenal 13 mm nodule, nonspecific, new from prior exam. In the absence of malignancy history, consider follow-up adrenal CT on a nonemergent basis. If there is history of malignancy, recommend PET/CT. 4. Colonic diverticulosis without diverticulitis. Nonobstructing right renal stone. 5. Multiple small pulmonary nodules in the lower lobes are unchanged from remote prior CT and considered benign. Electronically Signed   By: Jeb Levering M.D.   On: 03/27/2017 02:31   US Abdomen Limited Ruq  Result Date: 03/27/2017 CLINICAL DATA:  Right upper quadrant pain EXAM: ULTRASOUND ABDOMEN LIMITED RIGHT UPPER QUADRANT COMPARISON:  03/27/2017 CT FINDINGS: Gallbladder: Multiple small layering stones. No wall thickening or sonographic Murphy's. Common bile duct: Diameter: Normal caliber, 4 mm Liver: No focal lesion identified. Within normal limits in parenchymal echogenicity. Portal vein is patent on color Doppler imaging with normal direction of blood flow towards the liver. IMPRESSION: Multiple small layering stones.  No evidence of acute cholecystitis. Electronically Signed   By: Rolm Baptise M.D.   On: 03/27/2017 11:04   Assessment & Plan:  Maria Leblanc is a 61 y.o. female with RUQ pain, WBC, and mildly elevated transaminese and T bili on her admission without clear evidence on imaging of acute cholecystitis, but the patient is diabetic and I have seen irregular and inconsistent imaging with patients similar to her in the past.  Given her RUQ pain and the leukocytosis and no additional etiology, I think there is a high likelihood of cholecystitis.  UA negative, no signs of PNA, Blood cultures without growth.  I have discussed with the patient the  option of a HIDA scan and seeing if this is cholecystitis versus undergoing a laparoscopic cholecystectomy and exploration to identify other etiologies if the gallbladder does not appear infected. Discussed that the gallbladder may not be causing the infection, and that if we identified another source intraabdominally that needed surgical intervention that we would proceed with this and that this could mean a larger incision.    After a lengthy discussion the patient would like to undergo a laparoscopic cholecystectomy and exploration tomorrow and forego the HIDA scan.   PLAN: I counseled the patient about the indication, risks and benefits of laparoscopic cholecystectomy.  She understands there is a very small chance for bleeding, infection, injury to normal structures (including common bile duct), conversion to open surgery, persistent symptoms, evolution of postcholecystectomy diarrhea, need for secondary interventions, anesthesia reaction, cardiopulmonary issues and other risks not specifically detailed here. I described the expected recovery, the plan for follow-up and the restrictions during the recovery phase.  All questions were answered.  -NPO midnight  -Unasyn switched to zosyn  -Case posted for tomorrow, likely will be after lunch  -CXR wnl,  EKG sinus rhythm, no cardiac issues   All questions were answered to the satisfaction of the patient.   Virl Cagey 03/28/2017, 10:49 AM

## 2017-03-28 NOTE — Progress Notes (Signed)
Patient reported this afternoon that she has had reaction/intolerance to anesthesia with surgical procedures in the past. States "it makes me fight them when I'm waking up". Discussed with patient to be sure and notify anesthesia when they assess her pre-op. Noted anesthesia intolerance on her allergy file as well. Earnstine RegalAshley Jezelle Gullick, RN

## 2017-03-28 NOTE — Progress Notes (Signed)
PROGRESS NOTE    Ray ChurchSarah J Thuman  ZOX:[id number] DOB: October 04, 1955 DOA: 03/26/2017 PCP: [facility]    Brief Narrative:  61 year old female presented with 4-5-day history of chills, nausea and vomiting as well as right-sided abdominal pain.  Found to have significant leukocytosis and fevers.  Imaging indicated cholelithiasis raising concern for cholecystitis.  General surgery following and plans on cholecystectomy on 12/3.   Assessment & Plan:   Principal Problem:   Sepsis (HCC) Active Problems:   RUQ pain   Hyponatremia   Thrombocytopenia (HCC)   Diabetes mellitus type II, non insulin dependent (HCC)   Adrenal nodule (HCC)   Calculus of gallbladder with acute cholecystitis without obstruction   1. Sepsis, related to possible cholecystitis.  Patient has been started on intravenous antibiotics.  WBC count is slowly improving.  Hemodynamics are stable.  Overall fever curve is improving.  Cultures have shown no growth.  We will follow-up. 2. Possible cholecystitis.  Patient has significant right upper quadrant pain, leukocytosis and fever.  Imaging has shown cholelithiasis.  General surgery following.  Plans for cholecystectomy on 12/3. 3. Diabetes.  Currently on sliding scale.  Metformin on hold.  Blood sugars have been stable. 4. Migraine headaches.  Mild improvement after receiving Imitrex.  Will repeat dose. 5. Adrenal nodule.  Incidental finding on CT of 13 mm left adrenal nodule.  This can be followed up as an outpatient with adrenal CT on a nonemergent basis.   DVT prophylaxis: Lovenox Code Status: Full code Family Communication: No family present Disposition Plan: Discharge home once improved   Consultants:   General surgery  Procedures:     Antimicrobials:   Unasyn 12/1 > 12/2  Zosyn 12/2 >   Subjective: Continues to have right-sided abdominal pain.  Continues to have headache.  Had some improvement of headache yesterday after receiving 1  dose of Imitrex.  Objective: Vitals:   03/27/17 1323 03/27/17 1423 03/27/17 2155 03/28/17 0435  BP:  (!) 136/57 (!) 131/58 137/72  Pulse:  80 76 83  Resp:  18 18 18   Temp:  99.6 F (37.6 C) (!) 100.6 F (38.1 C) 99.8 F (37.7 C)  TempSrc:  Oral Oral Oral  SpO2: 92% 95% 93% 92%  Weight:      Height:        Intake/Output Summary (Last 24 hours) at 03/28/2017 1326 Last data filed at 03/28/2017 0526 Gross per 24 hour  Intake 3162.5 ml  Output 4 ml  Net 3158.5 ml   Filed Weights   03/26/17 2246 03/27/17 0601  Weight: 76.2 kg (168 lb) 76.2 kg (168 lb)    Examination:  General exam: Appears calm and comfortable  Respiratory system: Clear to auscultation. Respiratory effort normal. Cardiovascular system: S1 & S2 heard, RRR. No JVD, murmurs, rubs, gallops or clicks. No pedal edema. Gastrointestinal system: Abdomen is nondistended, tender in right upper quadrant. No organomegaly or masses felt. Normal bowel sounds heard. Central nervous system: Alert and oriented. No focal neurological deficits. Extremities: Symmetric 5 x 5 power. Skin: No rashes, lesions or ulcers Psychiatry: Judgement and insight appear normal. Mood & affect appropriate.     Data Reviewed: I have personally reviewed following labs and imaging studies  CBC: Recent Labs  Lab 03/26/17 2354 03/27/17 0519 03/28/17 0509  WBC 24.6* 20.8* 18.7*  NEUTROABS 20.9* 17.3* 14.4*  HGB 14.8 13.9 12.8  HCT 44.4 42.8 39.8  MCV 91.5 92.6 92.8  PLT 126* 176 166   Basic Metabolic Panel: Recent Labs  Lab 03/26/17 2354 03/27/17 0519 03/28/17 0509  NA 130* 133* 137  K 3.6 3.3* 3.5  CL 97* 99* 103  CO2 24 28 27   GLUCOSE 318* 258* 118*  BUN 10 10 8   CREATININE 0.44 0.41* 0.37*  CALCIUM 10.0 9.2 8.9   GFR: Estimated Creatinine Clearance: 76.4 mL/min (A) (by C-G formula based on SCr of 0.37 mg/dL (L)). Liver Function Tests: Recent Labs  Lab 03/26/17 2354 03/27/17 0519 03/28/17 0509  AST 13* 11* 14*  ALT 5*  11* 11*  ALKPHOS 124 111 101  BILITOT 1.4* 1.3* 0.5  PROT 7.2 6.2* 5.8*  ALBUMIN 3.3* 3.0* 2.7*   Recent Labs  Lab 03/26/17 2354  LIPASE 16   No results for input(s): AMMONIA in the last 168 hours. Coagulation Profile: No results for input(s): INR, PROTIME in the last 168 hours. Cardiac Enzymes: Recent Labs  Lab 03/27/17 0519  TROPONINI <0.03   BNP (last 3 results) No results for input(s): PROBNP in the last 8760 hours. HbA1C: No results for input(s): HGBA1C in the last 72 hours. CBG: Recent Labs  Lab 03/27/17 2013 03/27/17 2359 03/28/17 0431 03/28/17 0802 03/28/17 1134  GLUCAP 114* 163* 106* 112* 118*   Lipid Profile: No results for input(s): CHOL, HDL, LDLCALC, TRIG, CHOLHDL, LDLDIRECT in the last 72 hours. Thyroid Function Tests: No results for input(s): TSH, T4TOTAL, FREET4, T3FREE, THYROIDAB in the last 72 hours. Anemia Panel: No results for input(s): VITAMINB12, FOLATE, FERRITIN, TIBC, IRON, RETICCTPCT in the last 72 hours. Sepsis Labs: Recent Labs  Lab 03/27/17 0147  LATICACIDVEN 0.75    Recent Results (from the past 240 hour(s))  Blood culture (routine x 2)     Status: None (Preliminary result)   Collection Time: 03/27/17  1:32 AM  Result Value Ref Range Status   Specimen Description LEFT ANTECUBITAL  Final   Special Requests   Final    BOTTLES DRAWN AEROBIC AND ANAEROBIC Blood Culture adequate volume   Culture NO GROWTH 1 DAY  Final   Report Status PENDING  Incomplete  Blood culture (routine x 2)     Status: None (Preliminary result)   Collection Time: 03/27/17  1:47 AM  Result Value Ref Range Status   Specimen Description BLOOD RIGHT HAND  Final   Special Requests   Final    BOTTLES DRAWN AEROBIC AND ANAEROBIC Blood Culture adequate volume   Culture NO GROWTH 1 DAY  Final   Report Status PENDING  Incomplete  Urine Culture     Status: Abnormal   Collection Time: 03/27/17  3:40 AM  Result Value Ref Range Status   Specimen Description URINE,  CLEAN CATCH  Final   Special Requests NONE  Final   Culture MULTIPLE SPECIES PRESENT, SUGGEST RECOLLECTION (A)  Final   Report Status 03/28/2017 FINAL  Final         Radiology Studies: Dg Chest 2 View  Result Date: 03/27/2017 CLINICAL DATA:  Cough and fever. EXAM: CHEST  2 VIEW COMPARISON:  Report from radiographs 07/15/2015, images not available. FINDINGS: The cardiomediastinal contours are normal. Chronic elevation of right hemidiaphragm. Minimal streaky left lung base atelectasis. Pulmonary vasculature is normal. No consolidation, pleural effusion, or pneumothorax. No acute osseous abnormalities are seen. IMPRESSION: Minimal streaky left lung base atelectasis. Chronic elevation of right hemidiaphragm. Electronically Signed   By: Rubye Oaks M.D.   On: 03/27/2017 02:09   Ct Abdomen Pelvis W Contrast  Result Date: 03/27/2017 CLINICAL DATA:  Nausea and vomiting.  Chills. EXAM: CT  ABDOMEN AND PELVIS WITH CONTRAST TECHNIQUE: Multidetector CT imaging of the abdomen and pelvis was performed using the standard protocol following bolus administration of intravenous contrast. CONTRAST:  100mL ISOVUE-300 IOPAMIDOL (ISOVUE-300) INJECTION 61% COMPARISON:  CT 01/14/2004 FINDINGS: Lower chest: Multiple small pulmonary nodules in the both lower lobes are unchanged from prior exam and considered benign. Linear scarring in the right lower lobe. No pleural fluid or consolidation. Hepatobiliary: Multiple hepatic cysts again seen, largest lesion in the right lobe measures 15 mm. Minimal layering sludge or stones in the gallbladder without pericholecystic inflammation. No biliary dilatation. Pancreas: Fatty atrophy.  No ductal dilatation or inflammation. Spleen: Normal in size without focal abnormality. Adrenals/Urinary Tract: 13 low-density left adrenal nodule, not seen on prior exam. No right adrenal nodule. No hydronephrosis or perinephric edema. Punctate nonobstructing stone in the lower right kidney.  Homogeneous renal enhancement with symmetric excretion on delayed phase imaging. Urinary bladder is physiologically distended without wall thickening. Stomach/Bowel: Small hiatal hernia. Stomach is nondistended. No small bowel obstruction, wall thickening or inflammatory change. Normal appendix. Diverticulosis of the distal descending and sigmoid colon. No diverticulitis. No colonic inflammation. Vascular/Lymphatic: Normal caliber abdominal aorta. No enlarged abdominal or pelvic lymph nodes. Reproductive: Uterus and bilateral adnexa are unremarkable. Other: No free air, free fluid, or intra-abdominal fluid collection. Tiny fat containing umbilical hernia. Musculoskeletal: There are no acute or suspicious osseous abnormalities. IMPRESSION: 1. No acute abnormality in the abdomen/pelvis. 2. Stones or sludge in the gallbladder without gallbladder inflammation. 3. Left adrenal 13 mm nodule, nonspecific, new from prior exam. In the absence of malignancy history, consider follow-up adrenal CT on a nonemergent basis. If there is history of malignancy, recommend PET/CT. 4. Colonic diverticulosis without diverticulitis. Nonobstructing right renal stone. 5. Multiple small pulmonary nodules in the lower lobes are unchanged from remote prior CT and considered benign. Electronically Signed   By: Rubye OaksMelanie  Ehinger M.D.   On: 03/27/2017 02:31   [place] Abdomen Limited Ruq  Result Date: 03/27/2017 CLINICAL DATA:  Right upper quadrant pain EXAM: ULTRASOUND ABDOMEN LIMITED RIGHT UPPER QUADRANT COMPARISON:  03/27/2017 CT FINDINGS: Gallbladder: Multiple small layering stones. No wall thickening or sonographic Murphy's. Common bile duct: Diameter: Normal caliber, 4 mm Liver: No focal lesion identified. Within normal limits in parenchymal echogenicity. Portal vein is patent on color Doppler imaging with normal direction of blood flow towards the liver. IMPRESSION: Multiple small layering stones.  No evidence of acute cholecystitis.  Electronically Signed   By: Charlett NoseKevin  Dover M.D.   On: 03/27/2017 11:04        Scheduled Meds: . Chlorhexidine Gluconate Cloth  6 each Topical Once   And  . Chlorhexidine Gluconate Cloth  6 each Topical Once  . enoxaparin (LOVENOX) injection  40 mg Subcutaneous Q24H  . insulin aspart  0-9 Units Subcutaneous Q4H   Continuous Infusions: . sodium chloride 100 mL/hr at 03/28/17 1244  . piperacillin-tazobactam (ZOSYN)  IV       LOS: 0 days    Time spent: 25mins    Erick BlinksJehanzeb Cora Stetson, MD [facility] Pager [phone]  If 7PM-7AM, please contact night-coverage [email or web address] Password [email or web address] 03/28/2017, 1:26 PM

## 2017-03-29 ENCOUNTER — Observation Stay (HOSPITAL_COMMUNITY): Payer: Self-pay | Admitting: Anesthesiology

## 2017-03-29 ENCOUNTER — Encounter (HOSPITAL_COMMUNITY): Payer: Self-pay

## 2017-03-29 ENCOUNTER — Encounter (HOSPITAL_COMMUNITY)
Admission: EM | Disposition: A | Discharge status: Home / Self Care | Payer: Self-pay | Source: Home / Self Care | Attending: Internal Medicine

## 2017-03-29 DIAGNOSIS — K611 Rectal abscess: Secondary | ICD-10-CM | POA: Diagnosis present

## 2017-03-29 HISTORY — PX: INCISION AND DRAINAGE PERIRECTAL ABSCESS: SHX1804

## 2017-03-29 HISTORY — PX: CHOLECYSTECTOMY: SHX55

## 2017-03-29 LAB — CBC
HCT: 44.2 % (ref 36.0–46.0)
Hemoglobin: 14.3 g/dL (ref 12.0–15.0)
MCH: 30 pg (ref 26.0–34.0)
MCHC: 32.4 g/dL (ref 30.0–36.0)
MCV: 92.7 fL (ref 78.0–100.0)
PLATELETS: 237 10*3/uL (ref 150–400)
RBC: 4.77 MIL/uL (ref 3.87–5.11)
RDW: 12.5 % (ref 11.5–15.5)
WBC: 20.7 10*3/uL — ABNORMAL HIGH (ref 4.0–10.5)

## 2017-03-29 LAB — GLUCOSE, CAPILLARY
GLUCOSE-CAPILLARY: 103 mg/dL — AB (ref 65–99)
GLUCOSE-CAPILLARY: 230 mg/dL — AB (ref 65–99)
GLUCOSE-CAPILLARY: 76 mg/dL (ref 65–99)
GLUCOSE-CAPILLARY: 84 mg/dL (ref 65–99)
GLUCOSE-CAPILLARY: 99 mg/dL (ref 65–99)
Glucose-Capillary: 95 mg/dL (ref 65–99)
Glucose-Capillary: 91 mg/dL (ref 65–99)
Glucose-Capillary: 215 mg/dL — ABNORMAL HIGH (ref 65–99)

## 2017-03-29 SURGERY — LAPAROSCOPIC CHOLECYSTECTOMY
Anesthesia: General

## 2017-03-29 MED ORDER — MIDAZOLAM HCL 2 MG/2ML IJ SOLN
1.0000 mg | Freq: Once | INTRAMUSCULAR | Status: AC | PRN
  Administered 2017-03-29: 2 mg via INTRAVENOUS

## 2017-03-29 MED ORDER — FENTANYL CITRATE (PF) 250 MCG/5ML IJ SOLN
INTRAMUSCULAR | Status: AC
  Filled 2017-03-29: qty 5

## 2017-03-29 MED ORDER — FENTANYL CITRATE (PF) 100 MCG/2ML IJ SOLN
25.0000 ug | INTRAMUSCULAR | Status: DC | PRN
  Administered 2017-03-29: 50 ug via INTRAVENOUS
  Filled 2017-03-29: qty 2

## 2017-03-29 MED ORDER — SODIUM CHLORIDE 0.9 % IR SOLN
Status: DC | PRN
  Administered 2017-03-29: 1000 mL

## 2017-03-29 MED ORDER — OXYCODONE HCL 5 MG PO TABS: 5.0000 mg | ORAL_TABLET | ORAL | 0 refills | Status: DC | PRN

## 2017-03-29 MED ORDER — LIDOCAINE HCL (CARDIAC) 10 MG/ML IV SOLN
INTRAVENOUS | Status: DC | PRN
  Administered 2017-03-29: 40 mg via INTRAVENOUS

## 2017-03-29 MED ORDER — ROCURONIUM BROMIDE 100 MG/10ML IV SOLN
INTRAVENOUS | Status: DC | PRN
  Administered 2017-03-29: 10 mg via INTRAVENOUS
  Administered 2017-03-29: 30 mg via INTRAVENOUS

## 2017-03-29 MED ORDER — PROPOFOL 10 MG/ML IV BOLUS
INTRAVENOUS | Status: AC
  Filled 2017-03-29: qty 20

## 2017-03-29 MED ORDER — OXYCODONE HCL 5 MG PO TABS: 5.0000 mg | ORAL_TABLET | ORAL | Status: DC | PRN

## 2017-03-29 MED ORDER — BUPIVACAINE HCL (PF) 0.5 % IJ SOLN
INTRAMUSCULAR | Status: DC | PRN
  Administered 2017-03-29: 10 mL

## 2017-03-29 MED ORDER — SUGAMMADEX SODIUM 500 MG/5ML IV SOLN
INTRAVENOUS | Status: AC
  Filled 2017-03-29: qty 5

## 2017-03-29 MED ORDER — CEFOTETAN DISODIUM-DEXTROSE 2-2.08 GM-%(50ML) IV SOLR
INTRAVENOUS | Status: AC
  Filled 2017-03-29: qty 50

## 2017-03-29 MED ORDER — ONDANSETRON HCL 4 MG/2ML IJ SOLN
INTRAMUSCULAR | Status: DC | PRN
  Administered 2017-03-29: 4 mg via INTRAVENOUS

## 2017-03-29 MED ORDER — METOCLOPRAMIDE HCL 5 MG/ML IJ SOLN
10.0000 mg | Freq: Once | INTRAMUSCULAR | Status: AC
  Administered 2017-03-29: 10 mg via INTRAVENOUS

## 2017-03-29 MED ORDER — HEMOSTATIC AGENTS (NO CHARGE) OPTIME
TOPICAL | Status: DC | PRN
  Administered 2017-03-29: 1 via TOPICAL

## 2017-03-29 MED ORDER — SUGAMMADEX SODIUM 500 MG/5ML IV SOLN
INTRAVENOUS | Status: DC | PRN
  Administered 2017-03-29: 159.6 mg via INTRAVENOUS

## 2017-03-29 MED ORDER — LIDOCAINE HCL (PF) 1 % IJ SOLN
INTRAMUSCULAR | Status: AC
  Filled 2017-03-29: qty 10

## 2017-03-29 MED ORDER — LACTATED RINGERS IV SOLN
INTRAVENOUS | Status: DC
  Administered 2017-03-29 (×2): via INTRAVENOUS

## 2017-03-29 MED ORDER — ONDANSETRON 4 MG PO TBDP
ORAL_TABLET | ORAL | Status: AC
  Filled 2017-03-29: qty 1

## 2017-03-29 MED ORDER — MIDAZOLAM HCL 2 MG/2ML IJ SOLN
INTRAMUSCULAR | Status: AC
  Filled 2017-03-29: qty 2

## 2017-03-29 MED ORDER — BUPIVACAINE HCL (PF) 0.5 % IJ SOLN
INTRAMUSCULAR | Status: AC
  Filled 2017-03-29: qty 30

## 2017-03-29 MED ORDER — PROPOFOL 10 MG/ML IV BOLUS
INTRAVENOUS | Status: DC | PRN
  Administered 2017-03-29: 20 mg via INTRAVENOUS
  Administered 2017-03-29: 150 mg via INTRAVENOUS

## 2017-03-29 MED ORDER — METOCLOPRAMIDE HCL 5 MG/ML IJ SOLN
INTRAMUSCULAR | Status: AC
  Filled 2017-03-29: qty 2

## 2017-03-29 MED ORDER — OXYCODONE HCL 5 MG/5ML PO SOLN: 5.0000 mg | Freq: Once | ORAL | Status: DC | PRN

## 2017-03-29 MED ORDER — SUCCINYLCHOLINE CHLORIDE 20 MG/ML IJ SOLN
INTRAMUSCULAR | Status: AC
  Filled 2017-03-29: qty 1

## 2017-03-29 MED ORDER — ONDANSETRON 4 MG PO TBDP
4.0000 mg | ORAL_TABLET | Freq: Once | ORAL | Status: AC
  Administered 2017-03-29: 4 mg via ORAL

## 2017-03-29 MED ORDER — ONDANSETRON HCL 4 MG/2ML IJ SOLN
INTRAMUSCULAR | Status: AC
  Filled 2017-03-29: qty 2

## 2017-03-29 MED ORDER — FENTANYL CITRATE (PF) 100 MCG/2ML IJ SOLN
INTRAMUSCULAR | Status: DC | PRN
  Administered 2017-03-29 (×5): 50 ug via INTRAVENOUS

## 2017-03-29 MED ORDER — SUCCINYLCHOLINE CHLORIDE 20 MG/ML IJ SOLN
INTRAMUSCULAR | Status: DC | PRN
  Administered 2017-03-29: 120 mg via INTRAVENOUS

## 2017-03-29 MED ORDER — OXYCODONE HCL 5 MG PO TABS: 5.0000 mg | ORAL_TABLET | Freq: Once | ORAL | Status: DC | PRN

## 2017-03-29 MED ORDER — ROCURONIUM BROMIDE 50 MG/5ML IV SOLN
INTRAVENOUS | Status: AC
  Filled 2017-03-29: qty 1

## 2017-03-29 MED ORDER — CEFOTETAN DISODIUM-DEXTROSE 2-2.08 GM-%(50ML) IV SOLR
2.0000 g | INTRAVENOUS | Status: AC
  Administered 2017-03-29: 2 g via INTRAVENOUS

## 2017-03-29 SURGICAL SUPPLY — 57 items
ADH SKN CLS APL DERMABOND .7 (GAUZE/BANDAGES/DRESSINGS) ×2
APPLIER CLIP ROT 10 11.4 M/L (STAPLE) ×4
APR CLP MED LRG 11.4X10 (STAPLE) ×2
BAG HAMPER (MISCELLANEOUS) ×4 IMPLANT
BAG RETRIEVAL 10 (BASKET) ×1
BAG RETRIEVAL 10MM (BASKET) ×1
BLADE SURG 15 STRL LF DISP TIS (BLADE) ×2 IMPLANT
BLADE SURG 15 STRL SS (BLADE) ×4
CHLORAPREP W/TINT 26ML (MISCELLANEOUS) ×4 IMPLANT
CLIP APPLIE ROT 10 11.4 M/L (STAPLE) ×2 IMPLANT
CLOTH BEACON ORANGE TIMEOUT ST (SAFETY) ×4 IMPLANT
COVER LIGHT HANDLE STERIS (MISCELLANEOUS) ×8 IMPLANT
DECANTER SPIKE VIAL GLASS SM (MISCELLANEOUS) ×4 IMPLANT
DERMABOND ADVANCED (GAUZE/BANDAGES/DRESSINGS) ×2
DERMABOND ADVANCED .7 DNX12 (GAUZE/BANDAGES/DRESSINGS) ×2 IMPLANT
DRAIN PENROSE 12X.25 LTX STRL (MISCELLANEOUS) ×2 IMPLANT
ELECT REM PT RETURN 9FT ADLT (ELECTROSURGICAL) ×4
ELECTRODE REM PT RTRN 9FT ADLT (ELECTROSURGICAL) ×2 IMPLANT
FILTER SMOKE EVAC LAPAROSHD (FILTER) ×4 IMPLANT
GAUZE SPONGE 4X4 12PLY STRL (GAUZE/BANDAGES/DRESSINGS) ×2 IMPLANT
GLOVE BIO SURGEON STRL SZ 6.5 (GLOVE) ×3 IMPLANT
GLOVE BIO SURGEON STRL SZ7 (GLOVE) ×2 IMPLANT
GLOVE BIO SURGEONS STRL SZ 6.5 (GLOVE) ×1
GLOVE BIOGEL PI IND STRL 6.5 (GLOVE) ×2 IMPLANT
GLOVE BIOGEL PI IND STRL 7.0 (GLOVE) ×4 IMPLANT
GLOVE BIOGEL PI INDICATOR 6.5 (GLOVE) ×2
GLOVE BIOGEL PI INDICATOR 7.0 (GLOVE) ×8
GLOVE ECLIPSE 7.0 STRL STRAW (GLOVE) ×2 IMPLANT
GOWN STRL REUS W/TWL LRG LVL3 (GOWN DISPOSABLE) ×12 IMPLANT
HEMOSTAT SNOW SURGICEL 2X4 (HEMOSTASIS) ×4 IMPLANT
INST SET LAPROSCOPIC AP (KITS) ×4 IMPLANT
IV NS IRRIG 3000ML ARTHROMATIC (IV SOLUTION) IMPLANT
KIT ROOM TURNOVER APOR (KITS) ×4 IMPLANT
MANIFOLD NEPTUNE II (INSTRUMENTS) ×4 IMPLANT
NDL INSUFFLATION 14GA 120MM (NEEDLE) ×2 IMPLANT
NEEDLE INSUFFLATION 14GA 120MM (NEEDLE) ×4 IMPLANT
NS IRRIG 1000ML POUR BTL (IV SOLUTION) ×4 IMPLANT
PACK LAP CHOLE LZT030E (CUSTOM PROCEDURE TRAY) ×4 IMPLANT
PAD ABD 5X9 TENDERSORB (GAUZE/BANDAGES/DRESSINGS) ×2 IMPLANT
PAD ARMBOARD 7.5X6 YLW CONV (MISCELLANEOUS) ×4 IMPLANT
SET BASIN LINEN APH (SET/KITS/TRAYS/PACK) ×4 IMPLANT
SET TUBE IRRIG SUCTION NO TIP (IRRIGATION / IRRIGATOR) IMPLANT
SHEET LAVH (DRAPES) ×2 IMPLANT
SLEEVE ENDOPATH XCEL 5M (ENDOMECHANICALS) ×4 IMPLANT
SUT ETHILON 3 0 FSL (SUTURE) ×2 IMPLANT
SUT MNCRL AB 4-0 PS2 18 (SUTURE) ×4 IMPLANT
SUT VICRYL 0 UR6 27IN ABS (SUTURE) ×4 IMPLANT
SYS BAG RETRIEVAL 10MM (BASKET) ×2
SYSTEM BAG RETRIEVAL 10MM (BASKET) ×2 IMPLANT
TROCAR ENDO BLADELESS 11MM (ENDOMECHANICALS) ×4 IMPLANT
TROCAR XCEL NON-BLD 5MMX100MML (ENDOMECHANICALS) ×4 IMPLANT
TROCAR XCEL UNIV SLVE 11M 100M (ENDOMECHANICALS) ×4 IMPLANT
TUBE CONNECTING 12'X1/4 (SUCTIONS) ×1
TUBE CONNECTING 12X1/4 (SUCTIONS) ×3 IMPLANT
TUBING INSUFFLATION (TUBING) ×4 IMPLANT
WARMER LAPAROSCOPE (MISCELLANEOUS) ×4 IMPLANT
YANKAUER SUCT 12FT TUBE ARGYLE (SUCTIONS) ×2 IMPLANT

## 2017-03-29 NOTE — Transfer of Care (Signed)
Immediate Anesthesia Transfer of Care Note  Patient: Maria ChurchSarah J Leblanc  Procedure(s) Performed: LAPAROSCOPIC CHOLECYSTECTOMY (N/A ) INCISION  AND DRAINAGE PERIRECTAL ABSCESS  Patient Location: PACU  Anesthesia Type:General  Level of Consciousness: awake and confused; agitated  Airway & Oxygen Therapy: Patient Spontanous Breathing and Patient connected to nasal cannula oxygen  Post-op Assessment: Report given to RN and Post -op Vital signs reviewed and stable  Post vital signs: Reviewed and stable  Last Vitals:  Vitals:   03/29/17 1542 03/29/17 1600  BP: (!) 192/103 (!) 173/85  Pulse: 86 79  Resp: (!) 22 (!) 22  Temp: (!) 36.4 C   SpO2: 96% 93%    Last Pain:  Vitals:   03/29/17 1305  TempSrc:   PainSc: 4       Patients Stated Pain Goal: 2 (03/29/17 0800)  Patient diaphoretic and agitated on arrival to PACU; CBG 103; patient noted to have dislodged IV; dc'd catheter intact; another IV started to right hand by Langston MaskerMorris, RN. Patient less agitated after a few minutes but did complain of nausea and pain.  Zofran 4mg  IV and Fentanyl 50 mcg given.  Dr. Tomasa BlaseSchultz at bedside.

## 2017-03-29 NOTE — Discharge Instructions (Signed)
Discharge Instructions: Shower per your regular routine. Take tylenol and ibuprofen as needed for pain control, alternating every 4-6 hours.  Take Roxicodone for breakthrough pain. Take colace for constipation related to narcotic pain medication. Do not pick at the dermabond glue on your incision sites.  Leave the drains in place around your bottom. They are there for the infection to drain out. Take the antibiotics as indicated due to the infection around your anus.  The drains will be removed in the clinic once you have cleared the infection.    Perirectal Abscess An abscess is an infected area that contains a collection of pus. A perirectal abscess is an abscess that is near the opening of the anus or around the rectum. A perirectal abscess can cause a lot of pain, especially during bowel movements. What are the causes? This condition is almost always caused by an infection that starts in an anal gland. What increases the risk? This condition is more likely to develop in:  People with diabetes or inflammatory bowel disease.  People whose body defense system (immune system) is weak.  People who have anal sex.  People who have a sexually transmitted disease (STD).  People who have certain kinds of cancers, such as rectal carcinoma, leukemia, or lymphoma.  What are the signs or symptoms? The main symptom of this condition is pain. The pain may be a throbbing pain that gets worse during bowel movements. Other symptoms include:  Fever.  Swelling.  Redness.  Bleeding.  Constipation.  How is this diagnosed? The condition is diagnosed with a physical exam. If the abscess is not visible, a health care provider may need to place a finger inside the rectum to find the abscess. Sometimes, imaging tests are done to determine the size and location of the abscess. These tests may include:  An ultrasound.  An MRI.  A CT scan.  How is this treated? This condition is usually  treated with incision and drainage surgery. Incision and drainage surgery involves making an incision over the abscess to drain the pus. Treatment may also involve antibiotic medicine, pain medicine, stool softeners, or laxatives. Follow these instructions at home:  Take medicines only as directed by your health care provider.  If you were prescribed an antibiotic, finish all of it even if you start to feel better.  To relieve pain, try sitting: ? In a warm, shallow bath (sitz bath). ? On a heating pad with the setting on low. ? On an inflatable donut-shaped cushion.  Follow any diet instructions as directed by your health care provider.  Keep all follow-up visits as directed by your health care provider. This is important. Contact a health care provider if:  Your abscess is bleeding.  You have pain, swelling, or redness that is getting worse.  You are constipated.  You feel ill.  You have muscle aches or chills.  You have a fever.  Your symptoms return after the abscess has healed. This information is not intended to replace advice given to you by your health care provider. Make sure you discuss any questions you have with your health care provider. Document Released: 04/10/2000 Document Revised: 09/19/2015 Document Reviewed: 02/21/2014 Elsevier Interactive Patient Education  2018 [employer] Laparoscopic Cholecystectomy, Care After This sheet gives you information about how to care for yourself after your procedure. Your doctor may also give you more specific instructions. If you have problems or questions, contact your doctor. Follow these instructions at home: Care for cuts from surgery (  incisions)   Follow instructions from your doctor about how to take care of your cuts from surgery. Make sure you: ? Wash your hands with soap and water before you change your bandage (dressing). If you cannot use soap and water, use hand sanitizer. ? Change your bandage as told by  your doctor. ? Leave stitches (sutures), skin glue, or skin tape (adhesive) strips in place. They may need to stay in place for 2 weeks or longer. If tape strips get loose and curl up, you may trim the loose edges. Do not remove tape strips completely unless your doctor says it is okay.  Do not take baths, swim, or use a hot tub until your doctor says it is okay. Ask your doctor if you can take showers. You may only be allowed to take sponge baths for bathing.  Check your surgical cut area every day for signs of infection. Check for: ? More redness, swelling, or pain. ? More fluid or blood. ? Warmth. ? Pus or a bad smell. Activity  Do not drive or use heavy machinery while taking prescription pain medicine.  Do not lift anything that is heavier than 10 lb (4.5 kg) until your doctor says it is okay.  Do not play contact sports until your doctor says it is okay.  Do not drive for 24 hours if you were given a medicine to help you relax (sedative).  Rest as needed. Do not return to work or school until your doctor says it is okay. General instructions  Take over-the-counter and prescription medicines only as told by your doctor.  To prevent or treat constipation while you are taking prescription pain medicine, your doctor may recommend that you: ? Drink enough fluid to keep your pee (urine) clear or pale yellow. ? Take over-the-counter or prescription medicines. ? Eat foods that are high in fiber, such as fresh fruits and vegetables, whole grains, and beans. ? Limit foods that are high in fat and processed sugars, such as fried and sweet foods. Contact a doctor if:  You develop a rash.  You have more redness, swelling, or pain around your surgical cuts.  You have more fluid or blood coming from your surgical cuts.  Your surgical cuts feel warm to the touch.  You have pus or a bad smell coming from your surgical cuts.  You have a fever.  One or more of your surgical cuts breaks  open. Get help right away if:  You have trouble breathing.  You have chest pain.  You have pain that is getting worse in your shoulders.  You faint or feel dizzy when you stand.  You have very bad pain in your belly (abdomen).  You are sick to your stomach (nauseous) for more than one day.  You have throwing up (vomiting) that lasts for more than one day.  You have leg pain. This information is not intended to replace advice given to you by your health care provider. Make sure you discuss any questions you have with your health care provider. Document Released: 01/21/2008 Document Revised: 11/02/2015 Document Reviewed: 09/30/2015 Elsevier Interactive Patient Education  2017 [employer]

## 2017-03-29 NOTE — Op Note (Signed)
Operative Note   Preoperative Diagnosis: Acute Cholecystitis and Right perirectal/ labial abscess    Postoperative Diagnosis: Same   Procedure(s) Performed: Laparoscopic cholecystectomy and Incision and Drainage of right perirectal /labial abscess with Penrose Drain placement    Surgeon: Leatrice JewelsLindsay C. Henreitta LeberBridges, MD   Assistants: No qualified resident was available   Anesthesia: General endotracheal   Anesthesiologist: Carlyle BasquesSchultz, John R, MD    Specimens: Gallbladder    Estimated Blood Loss: Minimal    Blood Replacement: None    Complications: None    Operative Findings: Edematous, distended gallbladder consistent with Acute Cholecystitis; Right perirectal/ labial abscess with spontaneous drainage   Procedure: The patient was taken to the operating room and placed supine.  On moving to the bed, the RN noticed purulent fluid, and we found a right perirectal abscess that extended into her labial with induration.  She denied any prior pain in the area or drainage. After a discussion with anesthesia and the patient it was decided to undergo the cholecystectomy and to drain the abscess. General endotracheal anesthesia was induced. Intravenous antibiotics were administered per protocol. An orogastric tube positioned to decompress the stomach. The abdomen was prepared and draped in the usual sterile fashion.    A left upper quadrant stab incision was made and a Veress technique was utilized to achieve pneumoperitoneum to 15 mmHg with carbon dioxide. A 11 mm optiview port was placed through the supraumbilical region, and a 10 mm 0-degree operative laparoscope was introduced. The area underlying the trocar and Veress needle were inspected and without evidence of injury.  Remaining trocars were placed under direct vision. Two 5 mm ports were placed in the right abdomen, between the anterior axillary and midclavicular line.  A final 11 mm port was placed through the mid-epigastrium, near the falciform  ligament.    The gallbladder was distended and edematous. It was decompressed and the suction was used to evacuate the bile. The fundus was elevated cephalad and the infundibulum was retracted to the patient's right. The gallbladder/cystic duct junction was skeletonized. The cystic artery noted in the triangle of Calot and was also skeletonized.  We then continued liberal medial and lateral dissection until the critical view of safety was achieved.    The cystic duct was triply clipped and divided and the cystic artery were doubly clipped.  There was an additional posterior branch of the cystic artery that was clipped.  The gallbladder was then dissected from the liver bed with electrocautery. The specimen was placed in an Endopouch and was retrieved through the epigastric site. The bile that was spilled was removed from the field.    Final inspection revealed acceptable hemostasis. Surgical Jamelle HaringSnow was placed in the area. The epigastric and umbilical site were closed with a 0 Vicryl fascial sutures. Trocars were removed and pneumoperitoneum was released. Skin incisions were closed with 4-0 Monocryl subcuticular sutures and Dermabond. All counts were correct.   The drapes were removed and the patient was positioned in the lithotomy position. The patient's peritoneum was then prepped and draped with betadine.  Rectal exam was normal. The abscess had started to drain spontaneously on her right perirectal buttock, and a sharp incision was made and a hemostat was used to open the area. Purulent drainage was evacuated. The cavity extended into the right labia and down further into the right buttock.  Counter incisions were made lateral to the right labia and inferior on the right buttock.  Two Penrose 1/4 inch drains were placed through the  superior and inferior incisions and both were brought out through the central (initial) incision.  The Penroses were secured in two places with 2-0 Nylon suture loosely. The  patient is known to not have any family or friend at home to feed her cats, and given this it was felt that any type of packing procedure would not be a realistic option for her, so the Penrose drains were placed.  ABD and mesh panties were placed.  All counts were correct and hemostasis was adequate.  The patient was awakened from anesthesia and extubated without complication.    Algis GreenhouseLindsay Lonnetta Kniskern, MD [facility] [address] [place], [place] [id number] [phone] (office)

## 2017-03-29 NOTE — Progress Notes (Signed)
Called into the OR. Patient had a rupture of a perirectal abscess after moving over to the bed. Had no prior complaints of any perirectal pain or any reported drainage or complaints.  Still with RUQ pain. Less likely to be gallbladder at this poin but cannot rule out.   CT without anything obvious in the perirectal area but does not go down completely to the end to the buttock.   After discussing with Dr. Veatrice KellsShultz, plan for I&D of the abscess after the cholecystectomy as it is medically necessary.  Algis GreenhouseLindsay Wilborn Membreno, MD [facility] [address] [place], [place] [id number] [phone] (office)

## 2017-03-29 NOTE — Anesthesia Procedure Notes (Signed)
Procedure Name: Intubation Date/Time: 03/29/2017 1:58 PM Performed by: Pernell DupreAdams, Amy A, CRNA Pre-anesthesia Checklist: Patient identified, Patient being monitored, Timeout performed, Emergency Drugs available and Suction available Patient Re-evaluated:Patient Re-evaluated prior to induction Oxygen Delivery Method: Circle System Utilized Preoxygenation: Pre-oxygenation with 100% oxygen Induction Type: IV induction, Rapid sequence and Cricoid Pressure applied Laryngoscope Size: Miller and 3 Grade View: Grade I Tube type: Oral Tube size: 7.0 mm Number of attempts: 1 Airway Equipment and Method: Stylet Placement Confirmation: ETT inserted through vocal cords under direct vision,  positive ETCO2 and breath sounds checked- equal and bilateral Secured at: 21 cm Tube secured with: Tape Dental Injury: Teeth and Oropharynx as per pre-operative assessment

## 2017-03-29 NOTE — Interval H&P Note (Signed)
History and Physical Interval Note:  03/29/2017 12:46 PM  Maria Leblanc  has presented today for surgery, with the diagnosis of cholecystitis  The various methods of treatment have been discussed with the patient and family. After consideration of risks, benefits and other options for treatment, the patient has consented to  Procedure(s): LAPAROSCOPIC CHOLECYSTECTOMY (N/A) as a surgical intervention .  The patient's history has been reviewed, patient examined, no change in status, stable for surgery.  I have reviewed the patient's chart and labs.  Questions were answered to the patient's satisfaction.    High likelihood of gallbladder pathology. Discussed with patient at length yesterday. Plan for Lap chole and will do diagnostic laparoscopy if gallbladder appears normal.   Maria Leblanc

## 2017-03-29 NOTE — Anesthesia Postprocedure Evaluation (Signed)
Anesthesia Post Note  Patient: Maria Leblanc  Procedure(s) Performed: LAPAROSCOPIC CHOLECYSTECTOMY (N/A ) INCISION  AND DRAINAGE PERIRECTAL ABSCESS  Patient location during evaluation: PACU Anesthesia Type: General Level of consciousness: awake and alert, oriented and patient cooperative (Resting comfortably; no longer agitated) Pain management: pain level controlled Vital Signs Assessment: post-procedure vital signs reviewed and stable Respiratory status: spontaneous breathing Cardiovascular status: stable : Nausea after zofran. Anesthetic complications: no     Last Vitals:  Vitals:   03/29/17 1542 03/29/17 1600  BP: (!) 192/103 (!) 173/85  Pulse: 86 79  Resp: (!) 22 (!) 22  Temp: (!) 36.4 C   SpO2: 96% 93%    Last Pain:  Vitals:   03/29/17 1305  TempSrc:   PainSc: 4                  ADAMS, AMY A

## 2017-03-29 NOTE — Progress Notes (Signed)
PROGRESS NOTE    Ray ChurchSarah J Larrick  AOZ:[id number] DOB: [birth year] DOA: 03/26/2017 PCP: Assunta FoundGolding, John, MD    Brief Narrative:  61 year old female presented with 4-5-day history of chills, nausea and vomiting as well as right-sided abdominal pain.  Found to have significant leukocytosis and fevers.  Imaging indicated cholelithiasis raising concern for cholecystitis.  General surgery following and plans on cholecystectomy on 12/3.   Assessment & Plan:   Principal Problem:   Sepsis (HCC) Active Problems:   RUQ pain   Hyponatremia   Thrombocytopenia (HCC)   Diabetes mellitus type II, non insulin dependent (HCC)   Adrenal nodule (HCC)   Calculus of gallbladder with acute cholecystitis without obstruction   Perirectal abscess   1. Sepsis.  Patient is being treated with intravenous antibiotics.  WBC count remains elevated.  Hemodynamics are stable.  Continues to have fever spikes.  Blood cultures have shown no growth.  We will follow-up. 2. Possible cholecystitis.  Patient has significant right upper quadrant pain, leukocytosis and fever.  Imaging has shown cholelithiasis.  General surgery following.  Patient underwent cholecystectomy today without any immediate complications 3. Perirectal abscess.  Patient found to have perirectal abscess that had ruptured in the operating room.  This was evaluated by general surgery and further drained.  This was also likely contributing to her fevers and leukocytosis.  Continue antibiotics. 4. Diabetes.  Currently on sliding scale.  Metformin on hold.  Blood sugars have been stable. 5. Migraine headaches.  Mild improvement after receiving Imitrex.  Will repeat dose. 6. Adrenal nodule.  Incidental finding on CT of 13 mm left adrenal nodule.  This can be followed up as an outpatient with adrenal CT on a nonemergent basis.   DVT prophylaxis: Lovenox Code Status: Full code Family Communication: No family present Disposition Plan: Discharge home once  improved   Consultants:   General surgery  Procedures:     Antimicrobials:   Unasyn 12/1 > 12/2  Zosyn 12/2 >   Subjective: Continues to complain of headache.  Also has continued abdominal pain.  No vomiting. Objective: Vitals:   03/29/17 1700 03/29/17 1710 03/29/17 1715 03/29/17 1728  BP: 127/70 127/70 (!) 141/77 140/70  Pulse: 75 80 81 80  Resp: (!) 21 (!) 23 (!) 22 (!) 22  Temp:    97.6 F (36.4 C)  TempSrc:      SpO2: 100% 100%  92%  Weight:      Height:        Intake/Output Summary (Last 24 hours) at 03/29/2017 1829 Last data filed at 03/29/2017 1700 Gross per 24 hour  Intake 1500 ml  Output 7 ml  Net 1493 ml   Filed Weights   03/26/17 2246 03/27/17 0601 03/29/17 1245  Weight: 76.2 kg (168 lb) 76.2 kg (168 lb) 79.8 kg (176 lb)    Examination:  General exam: Appears calm and comfortable  Respiratory system: Clear to auscultation. Respiratory effort normal. Cardiovascular system: S1 & S2 heard, RRR. No JVD, murmurs, rubs, gallops or clicks. No pedal edema. Gastrointestinal system: Abdomen is nondistended, tenderness in right upper quadrant. No organomegaly or masses felt. Normal bowel sounds heard. Central nervous system: Alert and oriented. No focal neurological deficits. Extremities: Symmetric 5 x 5 power. Skin: No rashes, lesions or ulcers Psychiatry: Judgement and insight appear normal. Mood & affect appropriate.     Data Reviewed: I have personally reviewed following labs and imaging studies  CBC: Recent Labs  Lab 03/26/17 2354 03/27/17 0519 03/28/17 0509 03/29/17 [id number]  WBC 24.6* 20.8* 18.7* 20.7*  NEUTROABS 20.9* 17.3* 14.4*  --   HGB 14.8 13.9 12.8 14.3  HCT 44.4 42.8 39.8 44.2  MCV 91.5 92.6 92.8 92.7  PLT 126* 176 166 237   Basic Metabolic Panel: Recent Labs  Lab 03/26/17 2354 03/27/17 0519 03/28/17 0509 03/28/17 1406  NA 130* 133* 137 137  K 3.6 3.3* 3.5 3.5  CL 97* 99* 103 103  CO2 24 28 27 27   GLUCOSE 318* 258* 118*  134*  BUN 10 10 8 7   CREATININE 0.44 0.41* 0.37* 0.31*  CALCIUM 10.0 9.2 8.9 9.3   GFR: Estimated Creatinine Clearance: 78.9 mL/min (A) (by C-G formula based on SCr of 0.31 mg/dL (L)). Liver Function Tests: Recent Labs  Lab 03/26/17 2354 03/27/17 0519 03/28/17 0509 03/28/17 1406  AST 13* 11* 14* 16  ALT 5* 11* 11* 12*  ALKPHOS 124 111 101 107  BILITOT 1.4* 1.3* 0.5 0.6  PROT 7.2 6.2* 5.8* 6.2*  ALBUMIN 3.3* 3.0* 2.7* 2.7*   Recent Labs  Lab 03/26/17 2354  LIPASE 16   No results for input(s): AMMONIA in the last 168 hours. Coagulation Profile: No results for input(s): INR, PROTIME in the last 168 hours. Cardiac Enzymes: Recent Labs  Lab 03/27/17 0519  TROPONINI <0.03   BNP (last 3 results) No results for input(s): PROBNP in the last 8760 hours. HbA1C: No results for input(s): HGBA1C in the last 72 hours. CBG: Recent Labs  Lab 03/29/17 0422 03/29/17 0736 03/29/17 1104 03/29/17 1207 03/29/17 1542  GLUCAP 95 91 76 84 103*   Lipid Profile: No results for input(s): CHOL, HDL, LDLCALC, TRIG, CHOLHDL, LDLDIRECT in the last 72 hours. Thyroid Function Tests: No results for input(s): TSH, T4TOTAL, FREET4, T3FREE, THYROIDAB in the last 72 hours. Anemia Panel: No results for input(s): VITAMINB12, FOLATE, FERRITIN, TIBC, IRON, RETICCTPCT in the last 72 hours. Sepsis Labs: Recent Labs  Lab 03/27/17 0147  LATICACIDVEN 0.75    Recent Results (from the past 240 hour(s))  Blood culture (routine x 2)     Status: None (Preliminary result)   Collection Time: 03/27/17  1:32 AM  Result Value Ref Range Status   Specimen Description LEFT ANTECUBITAL  Final   Special Requests   Final    BOTTLES DRAWN AEROBIC AND ANAEROBIC Blood Culture adequate volume   Culture NO GROWTH 2 DAYS  Final   Report Status PENDING  Incomplete  Blood culture (routine x 2)     Status: None (Preliminary result)   Collection Time: 03/27/17  1:47 AM  Result Value Ref Range Status   Specimen  Description BLOOD RIGHT HAND  Final   Special Requests   Final    BOTTLES DRAWN AEROBIC AND ANAEROBIC Blood Culture adequate volume   Culture NO GROWTH 2 DAYS  Final   Report Status PENDING  Incomplete  Urine Culture     Status: Abnormal   Collection Time: 03/27/17  3:40 AM  Result Value Ref Range Status   Specimen Description URINE, CLEAN CATCH  Final   Special Requests NONE  Final   Culture MULTIPLE SPECIES PRESENT, SUGGEST RECOLLECTION (A)  Final   Report Status 03/28/2017 FINAL  Final  Surgical pcr screen     Status: Abnormal   Collection Time: 03/28/17 12:45 PM  Result Value Ref Range Status   MRSA, PCR NEGATIVE NEGATIVE Final   Staphylococcus aureus POSITIVE (A) NEGATIVE Final    Comment: (NOTE) The Xpert SA Assay (FDA approved for NASAL specimens in patients 22  years of age and older), is one component of a comprehensive surveillance program. It is not intended to diagnose infection nor to guide or monitor treatment.          Radiology Studies: No results found.      Scheduled Meds: . Chlorhexidine Gluconate Cloth  6 each Topical Daily  . enoxaparin (LOVENOX) injection  40 mg Subcutaneous Q24H  . insulin aspart  0-9 Units Subcutaneous Q4H  . mupirocin ointment  1 application Nasal BID  . polyethylene glycol  17 g Oral Daily   Continuous Infusions: . piperacillin-tazobactam (ZOSYN)  IV Stopped (03/29/17 0905)     LOS: 0 days    Time spent:    Erick Blinks, MD [facility] Pager [phone]  If 7PM-7AM, please contact night-coverage [email or web address] Password [facility] 03/29/2017, 6:29 PM

## 2017-03-29 NOTE — Anesthesia Preprocedure Evaluation (Signed)
Anesthesia Evaluation  Patient identified by MRN, date of birth, ID band  History of Anesthesia Complications (+) history of anesthetic complications (fighting after wake up)  Airway Mallampati: I  TM Distance: >3 FB Neck ROM: Full    Dental no notable dental hx. (+) Teeth Intact   Pulmonary Current Smoker,    Pulmonary exam normal breath sounds clear to auscultation       Cardiovascular Exercise Tolerance: Good METS: 5 - 7 Mets  Rhythm:Regular Rate:Normal  NSR, consider ant infarct   Neuro/Psych  Headaches,    GI/Hepatic negative GI ROS, Neg liver ROS,   Endo/Other  diabetes, Well Controlled, Type 2  Renal/GU negative Renal ROS     Musculoskeletal   Abdominal   Peds  Hematology   Anesthesia Other Findings   Reproductive/Obstetrics                             Anesthesia Physical Anesthesia Plan  ASA: III  Anesthesia Plan: General   Post-op Pain Management:    Induction:   PONV Risk Score and Plan:   Airway Management Planned: Oral ETT  Additional Equipment:   Intra-op Plan:   Post-operative Plan:   Informed Consent: I have reviewed the patients History and Physical, chart, labs and discussed the procedure including the risks, benefits and alternatives for the proposed anesthesia with the patient or authorized representative who has indicated his/her understanding and acceptance.   Dental advisory given  Plan Discussed with: CRNA  Anesthesia Plan Comments:         Anesthesia Quick Evaluation

## 2017-03-29 NOTE — Care Management (Addendum)
[employer] voucher completed and will by  placed on patient's chart by RN.  Patient in OR.

## 2017-03-30 ENCOUNTER — Encounter (HOSPITAL_COMMUNITY): Payer: Self-pay | Admitting: General Surgery

## 2017-03-30 DIAGNOSIS — K611 Rectal abscess: Secondary | ICD-10-CM

## 2017-03-30 DIAGNOSIS — K8000 Calculus of gallbladder with acute cholecystitis without obstruction: Secondary | ICD-10-CM

## 2017-03-30 DIAGNOSIS — E279 Disorder of adrenal gland, unspecified: Secondary | ICD-10-CM

## 2017-03-30 DIAGNOSIS — E119 Type 2 diabetes mellitus without complications: Secondary | ICD-10-CM

## 2017-03-30 DIAGNOSIS — A419 Sepsis, unspecified organism: Principal | ICD-10-CM

## 2017-03-30 LAB — CBC
HEMATOCRIT: 37.1 % (ref 36.0–46.0)
Hemoglobin: 12.1 g/dL (ref 12.0–15.0)
MCH: 29.5 pg (ref 26.0–34.0)
MCHC: 32.6 g/dL (ref 30.0–36.0)
MCV: 90.5 fL (ref 78.0–100.0)
PLATELETS: 205 10*3/uL (ref 150–400)
RBC: 4.1 MIL/uL (ref 3.87–5.11)
RDW: 12.2 % (ref 11.5–15.5)
WBC: 13.7 10*3/uL — ABNORMAL HIGH (ref 4.0–10.5)

## 2017-03-30 LAB — GLUCOSE, CAPILLARY
GLUCOSE-CAPILLARY: 207 mg/dL — AB (ref 65–99)
GLUCOSE-CAPILLARY: 223 mg/dL — AB (ref 65–99)
Glucose-Capillary: 118 mg/dL — ABNORMAL HIGH (ref 65–99)

## 2017-03-30 MED ORDER — METFORMIN HCL 500 MG PO TABS: 500.0000 mg | ORAL_TABLET | Freq: Two times a day (BID) | ORAL | 0 refills | Status: DC

## 2017-03-30 MED ORDER — AMOXICILLIN-POT CLAVULANATE 875-125 MG PO TABS: 1.0000 | ORAL_TABLET | Freq: Two times a day (BID) | ORAL | 0 refills | Status: AC

## 2017-03-30 NOTE — Progress Notes (Signed)
Patient requested refill prescription for her metformin prior to discharge this evening, notified Dr Kerry HoughMemon. MD sent prescription to her pharmacy. Pt had also reported her rings being missing and not returned to her after surgery. Notified day surgery to check for them. Stated sent back with patient to her room. Rings found in bag of belongings and patient wore them at discharge. Pt left floor in stable condition via w/c accompanied by nurse tech for discharge home. Maria RegalAshley Zaydenn Balaguer, RN

## 2017-03-30 NOTE — Discharge Summary (Signed)
Physician Discharge Summary  FAYETTA SORENSON UEA:[id number] DOB: 1956-04-07 DOA: 03/26/2017  PCP: Assunta Found, MD  Admit date: 03/26/2017 Discharge date: 03/30/2017  Admitted From: Home Disposition: Home  Recommendations for Outpatient Follow-up:  1. Follow up with PCP in 1-2 weeks 2. Please obtain BMP/CBC in one week 3. Follow-up with Dr. Henreitta Leber in 1 week 4. Consider outpatient adrenal CT to evaluate underlying left adrenal nodule  Home Health: Equipment/Devices:  Discharge Condition: Stable CODE STATUS: Full code Diet recommendation: Heart Healthy / Carb Modified   Brief/Interim Summary: 61 year old female presented with 4-5-day history of chills, nausea and vomiting as well as right-sided abdominal pain.  Found to have significant leukocytosis and fevers.  Imaging indicated cholelithiasis raising concern for cholecystitis.     Discharge Diagnoses:  Principal Problem:   Sepsis (HCC) Active Problems:   RUQ pain   Hyponatremia   Thrombocytopenia (HCC)   Diabetes mellitus type II, non insulin dependent (HCC)   Adrenal nodule (HCC)   Calculus of gallbladder with acute cholecystitis without obstruction   Perirectal abscess  1. Sepsis.  Patient was treated with intravenous antibiotics.  WBC count  was initially elevated.  Hemodynamics are stable.  Patient was having significant fevers.  Blood cultures have shown no growth.    Since surgical intervention, WBC count has improved and fevers have resolved.  Continue oral antibiotics. 2. Possible cholecystitis.  Patient has significant right upper quadrant pain, leukocytosis and fever.  Imaging had shown cholelithiasis.  General surgery following.  Patient underwent cholecystectomy without any immediate complications 3. Perirectal abscess.  Patient found to have perirectal abscess that had ruptured in the operating room.  This was evaluated by general surgery and further drained.  This was also likely contributing to her fevers and  leukocytosis.  Continue antibiotics. 4. Diabetes.    Resume metformin on discharge. Blood sugars have been stable. 5. Migraine headaches.    Improved after Imitrex.. 6. Adrenal nodule.  Incidental finding on CT of 13 mm left adrenal nodule.  This can be followed up as an outpatient with adrenal CT on a nonemergent basis.  Discharge Instructions  Discharge Instructions    Diet - low sodium heart healthy   Complete by:  As directed    Increase activity slowly   Complete by:  As directed      Allergies as of 03/30/2017      Reactions   Anesthesia S-i-60 Other (See Comments)   Patient reports "it causes me to fight them when i'm waking up"      Medication List    TAKE these medications   amoxicillin-clavulanate 875-125 MG tablet Commonly known as:  AUGMENTIN Take 1 tablet by mouth 2 (two) times daily for 7 days.   ibuprofen 200 MG tablet Commonly known as:  ADVIL,MOTRIN Take 400 mg by mouth every 6 (six) hours as needed for mild pain or moderate pain.   metFORMIN 500 MG tablet Commonly known as:  GLUCOPHAGE Take 1 tablet (500 mg total) by mouth 2 (two) times daily with a meal.   oxyCODONE 5 MG immediate release tablet Commonly known as:  Oxy IR/ROXICODONE Take 1 tablet (5 mg total) by mouth every 4 (four) hours as needed for severe pain or breakthrough pain.      Follow-up Information    Lucretia Roers, MD Follow up in 1 week(s).   Specialty:  General Surgery Contact information: [address] [facility] [address] [phone]          Allergies  Allergen  Reactions  . Anesthesia S-I-60 Other (See Comments)    Patient reports "it causes me to fight them when i'm waking up"    Consultations:  General surgery, Dr. Henreitta Leber   Procedures/Studies: Dg Chest 2 View  Result Date: 03/27/2017 CLINICAL DATA:  Cough and fever. EXAM: CHEST  2 VIEW COMPARISON:  Report from radiographs 07/15/2015, images not available. FINDINGS: The cardiomediastinal contours  are normal. Chronic elevation of right hemidiaphragm. Minimal streaky left lung base atelectasis. Pulmonary vasculature is normal. No consolidation, pleural effusion, or pneumothorax. No acute osseous abnormalities are seen. IMPRESSION: Minimal streaky left lung base atelectasis. Chronic elevation of right hemidiaphragm. Electronically Signed   By: Rubye Oaks M.D.   On: 03/27/2017 02:09   Ct Abdomen Pelvis W Contrast  Result Date: 03/27/2017 CLINICAL DATA:  Nausea and vomiting.  Chills. EXAM: CT ABDOMEN AND PELVIS WITH CONTRAST TECHNIQUE: Multidetector CT imaging of the abdomen and pelvis was performed using the standard protocol following bolus administration of intravenous contrast. CONTRAST:  ISOVUE-300 IOPAMIDOL (ISOVUE-300) INJECTION 61% COMPARISON:  CT 01/14/2004 FINDINGS: Lower chest: Multiple small pulmonary nodules in the both lower lobes are unchanged from prior exam and considered benign. Linear scarring in the right lower lobe. No pleural fluid or consolidation. Hepatobiliary: Multiple hepatic cysts again seen, largest lesion in the right lobe measures 15 mm. Minimal layering sludge or stones in the gallbladder without pericholecystic inflammation. No biliary dilatation. Pancreas: Fatty atrophy.  No ductal dilatation or inflammation. Spleen: Normal in size without focal abnormality. Adrenals/Urinary Tract: 13 low-density left adrenal nodule, not seen on prior exam. No right adrenal nodule. No hydronephrosis or perinephric edema. Punctate nonobstructing stone in the lower right kidney. Homogeneous renal enhancement with symmetric excretion on delayed phase imaging. Urinary bladder is physiologically distended without wall thickening. Stomach/Bowel: Small hiatal hernia. Stomach is nondistended. No small bowel obstruction, wall thickening or inflammatory change. Normal appendix. Diverticulosis of the distal descending and sigmoid colon. No diverticulitis. No colonic inflammation.  Vascular/Lymphatic: Normal caliber abdominal aorta. No enlarged abdominal or pelvic lymph nodes. Reproductive: Uterus and bilateral adnexa are unremarkable. Other: No free air, free fluid, or intra-abdominal fluid collection. Tiny fat containing umbilical hernia. Musculoskeletal: There are no acute or suspicious osseous abnormalities. IMPRESSION: 1. No acute abnormality in the abdomen/pelvis. 2. Stones or sludge in the gallbladder without gallbladder inflammation. 3. Left adrenal 13 mm nodule, nonspecific, new from prior exam. In the absence of malignancy history, consider follow-up adrenal CT on a nonemergent basis. If there is history of malignancy, recommend PET/CT. 4. Colonic diverticulosis without diverticulitis. Nonobstructing right renal stone. 5. Multiple small pulmonary nodules in the lower lobes are unchanged from remote prior CT and considered benign. Electronically Signed   By: Rubye Oaks M.D.   On: 03/27/2017 02:31   US Abdomen Limited Ruq  Result Date: 03/27/2017 CLINICAL DATA:  Right upper quadrant pain EXAM: ULTRASOUND ABDOMEN LIMITED RIGHT UPPER QUADRANT COMPARISON:  03/27/2017 CT FINDINGS: Gallbladder: Multiple small layering stones. No wall thickening or sonographic Murphy's. Common bile duct: Diameter: Normal caliber, 4 mm Liver: No focal lesion identified. Within normal limits in parenchymal echogenicity. Portal vein is patent on color Doppler imaging with normal direction of blood flow towards the liver. IMPRESSION: Multiple small layering stones.  No evidence of acute cholecystitis. Electronically Signed   By: Charlett Nose M.D.   On: 03/27/2017 11:04    Laparoscopic cholecystectomy and Incision and Drainage of right perirectal /labial abscess with Penrose Drain placement    Subjective: Feeling better today.  Abdominal pain is controlled.  No headache.  Discharge Exam: Vitals:   03/29/17 2328 03/30/17 0407  BP: (!) 145/90 131/77  Pulse: 73   Resp: 16 16  Temp: 99 F  (37.2 C) 98.4 F (36.9 C)  SpO2: 97% 94%   Vitals:   03/29/17 1928 03/29/17 2000 03/29/17 2328 03/30/17 0407  BP: (!) 141/73  (!) 145/90 131/77  Pulse: 83  73   Resp: 18  16 16   Temp: 98.4 F (36.9 C)  99 F (37.2 C) 98.4 F (36.9 C)  TempSrc: Oral  Oral Oral  SpO2: 95% 93% 97% 94%  Weight:      Height:        General: Pt is alert, awake, not in acute distress Cardiovascular: RRR, S1/S2 +, no rubs, no gallops Respiratory: CTA bilaterally, no wheezing, no rhonchi Abdominal: Soft, NT, ND, bowel sounds + Extremities: no edema, no cyanosis    The results of significant diagnostics from this hospitalization (including imaging, microbiology, ancillary and laboratory) are listed below for reference.     Microbiology: Recent Results (from the past 240 hour(s))  Blood culture (routine x 2)     Status: None (Preliminary result)   Collection Time: 03/27/17  1:32 AM  Result Value Ref Range Status   Specimen Description LEFT ANTECUBITAL  Final   Special Requests   Final    BOTTLES DRAWN AEROBIC AND ANAEROBIC Blood Culture adequate volume   Culture NO GROWTH 3 DAYS  Final   Report Status PENDING  Incomplete  Blood culture (routine x 2)     Status: None (Preliminary result)   Collection Time: 03/27/17  1:47 AM  Result Value Ref Range Status   Specimen Description BLOOD RIGHT HAND  Final   Special Requests   Final    BOTTLES DRAWN AEROBIC AND ANAEROBIC Blood Culture adequate volume   Culture NO GROWTH 3 DAYS  Final   Report Status PENDING  Incomplete  Urine Culture     Status: Abnormal   Collection Time: 03/27/17  3:40 AM  Result Value Ref Range Status   Specimen Description URINE, CLEAN CATCH  Final   Special Requests NONE  Final   Culture MULTIPLE SPECIES PRESENT, SUGGEST RECOLLECTION (A)  Final   Report Status 03/28/2017 FINAL  Final  Surgical pcr screen     Status: Abnormal   Collection Time: 03/28/17 12:45 PM  Result Value Ref Range Status   MRSA, PCR NEGATIVE NEGATIVE  Final   Staphylococcus aureus POSITIVE (A) NEGATIVE Final    Comment: (NOTE) The Xpert SA Assay (FDA approved for NASAL specimens in patients 81 years of age and older), is one component of a comprehensive surveillance program. It is not intended to diagnose infection nor to guide or monitor treatment.      Labs: BNP (last 3 results) No results for input(s): BNP in the last 8760 hours. Basic Metabolic Panel: Recent Labs  Lab 03/26/17 2354 03/27/17 0519 03/28/17 0509 03/28/17 1406  NA 130* 133* 137 137  K 3.6 3.3* 3.5 3.5  CL 97* 99* 103 103  CO2 24 28 27 27   GLUCOSE 318* 258* 118* 134*  BUN 10 10 8 7   CREATININE 0.44 0.41* 0.37* 0.31*  CALCIUM 10.0 9.2 8.9 9.3   Liver Function Tests: Recent Labs  Lab 03/26/17 2354 03/27/17 0519 03/28/17 0509 03/28/17 1406  AST 13* 11* 14* 16  ALT 5* 11* 11* 12*  ALKPHOS 124 111 101 107  BILITOT 1.4* 1.3* 0.5 0.6  PROT 7.2  6.2* 5.8* 6.2*  ALBUMIN 3.3* 3.0* 2.7* 2.7*   Recent Labs  Lab 03/26/17 2354  LIPASE 16   No results for input(s): AMMONIA in the last 168 hours. CBC: Recent Labs  Lab 03/26/17 2354 03/27/17 0519 03/28/17 0509 03/29/17 0452 03/30/17 0451  WBC 24.6* 20.8* 18.7* 20.7* 13.7*  NEUTROABS 20.9* 17.3* 14.4*  --   --   HGB 14.8 13.9 12.8 14.3 12.1  HCT 44.4 42.8 39.8 44.2 37.1  MCV 91.5 92.6 92.8 92.7 90.5  PLT 126* 176 166 237 205   Cardiac Enzymes: Recent Labs  Lab 03/27/17 0519  TROPONINI <0.03   BNP: Invalid input(s): POCBNP CBG: Recent Labs  Lab 03/29/17 1949 03/29/17 2322 03/30/17 0404 03/30/17 0723 03/30/17 1103  GLUCAP 215* 230* 207* 118* 223*   D-Dimer No results for input(s): DDIMER in the last 72 hours. Hgb A1c No results for input(s): HGBA1C in the last 72 hours. Lipid Profile No results for input(s): CHOL, HDL, LDLCALC, TRIG, CHOLHDL, LDLDIRECT in the last 72 hours. Thyroid function studies No results for input(s): TSH, T4TOTAL, T3FREE, THYROIDAB in the last 72  hours.  Invalid input(s): FREET3 Anemia work up No results for input(s): VITAMINB12, FOLATE, FERRITIN, TIBC, IRON, RETICCTPCT in the last 72 hours. Urinalysis    Component Value Date/Time   COLORURINE YELLOW 03/27/2017 0340   APPEARANCEUR CLEAR 03/27/2017 0340   LABSPEC >1.046 (H) 03/27/2017 0340   PHURINE 5.0 03/27/2017 0340   GLUCOSEU >=500 (A) 03/27/2017 0340   HGBUR NEGATIVE 03/27/2017 0340   BILIRUBINUR NEGATIVE 03/27/2017 0340   KETONESUR 5 (A) 03/27/2017 0340   PROTEINUR NEGATIVE 03/27/2017 0340   NITRITE NEGATIVE 03/27/2017 0340   LEUKOCYTESUR NEGATIVE 03/27/2017 0340   Sepsis Labs Invalid input(s): PROCALCITONIN,  WBC,  LACTICIDVEN Microbiology Recent Results (from the past 240 hour(s))  Blood culture (routine x 2)     Status: None (Preliminary result)   Collection Time: 03/27/17  1:32 AM  Result Value Ref Range Status   Specimen Description LEFT ANTECUBITAL  Final   Special Requests   Final    BOTTLES DRAWN AEROBIC AND ANAEROBIC Blood Culture adequate volume   Culture NO GROWTH 3 DAYS  Final   Report Status PENDING  Incomplete  Blood culture (routine x 2)     Status: None (Preliminary result)   Collection Time: 03/27/17  1:47 AM  Result Value Ref Range Status   Specimen Description BLOOD RIGHT HAND  Final   Special Requests   Final    BOTTLES DRAWN AEROBIC AND ANAEROBIC Blood Culture adequate volume   Culture NO GROWTH 3 DAYS  Final   Report Status PENDING  Incomplete  Urine Culture     Status: Abnormal   Collection Time: 03/27/17  3:40 AM  Result Value Ref Range Status   Specimen Description URINE, CLEAN CATCH  Final   Special Requests NONE  Final   Culture MULTIPLE SPECIES PRESENT, SUGGEST RECOLLECTION (A)  Final   Report Status 03/28/2017 FINAL  Final  Surgical pcr screen     Status: Abnormal   Collection Time: 03/28/17 12:45 PM  Result Value Ref Range Status   MRSA, PCR NEGATIVE NEGATIVE Final   Staphylococcus aureus POSITIVE (A) NEGATIVE Final     Comment: (NOTE) The Xpert SA Assay (FDA approved for NASAL specimens in patients 60 years of age and older), is one component of a comprehensive surveillance program. It is not intended to diagnose infection nor to guide or monitor treatment.      Time coordinating  discharge: Over 30 minutes  SIGNED:   Erick BlinksJehanzeb Amity Roes, MD  [facility] 03/30/2017, 1:13 PM Pager   If 7PM-7AM, please contact night-coverage [email or web address] Password [email or web address]

## 2017-03-30 NOTE — Care Management Note (Signed)
Case Management Note  Patient Details  Name: Maria Leblanc MRN: [id number] Date of Birth: [birth year]  :   Expected Discharge Date:       03/30/2017           Expected Discharge Plan:  Home/Self Care  In-House Referral:     Discharge planning Services  CM Consult, Medication Assistance, MATCH Program  Post Acute Care Choice:  NA Choice offered to:  NA  DME Arranged:    DME Agency:     HH Arranged:    HH Agency:     Status of Service:  Completed, signed off  If discussed at [employer] Length of Stay Meetings, dates discussed:    Additional Comments: Patient discharging today. Reviewed MATCH voucher with patient. She is also aware of [facility] and utilizes. She is listed as not having a PCP, but reports [facility] but hasn't been in a long time due to not having insurance. She will speak with Financial counselor this visit in regards to applying for Medicaid. CM gave list of providers for patient to establish new primary care. We discussed other resources such as [facility] and [facility]. Discussed with Dr. Henreitta LeberBridges, it is not expected that [facility], patient is agreeable to self manage at home and will be following up with Dr. Henreitta LeberBridges in office.   Itzael Liptak, Chrystine OilerSharley Diane, RN 03/30/2017, 11:22 AM

## 2017-03-30 NOTE — Progress Notes (Signed)
[facility] Progress Note  1 Day Post-Op  Subjective: Doing well. Pain improved in abdomen, bottom sore. WBC down.   Objective: Vital signs in last 24 hours: Temp:  [97.5 F (36.4 C)-99 F (37.2 C)] 98.4 F (36.9 C) (12/04 0407) Pulse Rate:  [73-86] 73 (12/03 2328) Resp:  [11-34] 16 (12/04 0407) BP: (122-192)/(70-103) 131/77 (12/04 0407) SpO2:  [89 %-100 %] 94 % (12/04 0407) Weight:  [176 lb (79.8 kg)] 176 lb (79.8 kg) (12/03 1245) Last BM Date: 03/25/17  Intake/Output from previous day: 12/03 0701 - 12/04 0700 In: 1954 [P.O.:555; I.V.:1350; IV Piggyback:50] Out: 508 [Urine:503; Blood:5] Intake/Output this shift: No intake/output data recorded.  General appearance: alert, cooperative and no distress Resp: normal work breathing GI: soft, appropriately tender, port sites c/d/i with dermabond, no erythema or drainage right buttock and labia with induration, penroses in place, purulent drainage, tender  Lab Results:  Recent Labs    03/29/17 0452 03/30/17 0451  WBC 20.7* 13.7*  HGB 14.3 12.1  HCT 44.2 37.1  PLT 237 205   BMET Recent Labs    03/28/17 0509 03/28/17 1406  NA 137 137  K 3.5 3.5  CL 103 103  CO2 27 27  GLUCOSE 118* 134*  BUN 8 7  CREATININE 0.37* 0.31*  CALCIUM 8.9 9.3   PT/INR No results for input(s): LABPROT, INR in the last 72 hours.  Studies/Results: No results found.  Anti-infectives: Anti-infectives (From admission, onward)   Start     Dose/Rate Route Frequency Ordered Stop   03/30/17 0600  cefoTEtan in Dextrose 5% (CEFOTAN) IVPB 2 g     2 g Intravenous On call to O.R. 03/29/17 1223 03/29/17 1408   03/28/17 1400  piperacillin-tazobactam (ZOSYN) IVPB 3.375 g     3.375 g 12.5 mL/hr over 240 Minutes Intravenous Every 8 hours 03/28/17 1102     03/28/17 1200  piperacillin-tazobactam (ZOSYN) IVPB 3.375 g  Status:  Discontinued     3.375 g 100 mL/hr over 30 Minutes Intravenous Every 6 hours 03/28/17 1052 03/28/17 1100   03/27/17 1730  Ampicillin-Sulbactam (UNASYN) 3 g in sodium chloride 0.9 % 100 mL IVPB  Status:  Discontinued     3 g 200 mL/hr over 30 Minutes Intravenous Every 6 hours 03/27/17 1722 03/28/17 1052   03/27/17 0445  piperacillin-tazobactam (ZOSYN) IVPB 3.375 g     3.375 g 100 mL/hr over 30 Minutes Intravenous  Once 03/27/17 0431 03/27/17 0510      Assessment/Plan: [name] is a 61 yo with acute cholecystitis and a perirectal abscess s/p lap chole and I&D with penrose drainage. Doing fair. WBC down, abscess draining. Tolerating diet.  -Can go home -Follow up next week -Penroses to stay in place, patient understands that the penroses might not adequately treat this but they were placed due to her lack of support, and that additional drainage may ultimately be needed     LOS: 1 day    Lucretia RoersLindsay C Corazon Nickolas 03/30/2017

## 2017-03-30 NOTE — Progress Notes (Addendum)
Patient being discharged home today. Reviewed discharge instructions with patient. Given copy of AVS, prescription, voucher from CM, and discussed f/u and post-op care. Verbalized understanding of discharge instructions, f/u and when to call MD if any signs of post-op complications. Provided education regarding use of incentive spirometer as well. Verbalized understanding.  IV site d/c'd per NT. Pt in stable condition awaiting friend/family arrival for transport home. Earnstine RegalAshley Elgie Landino, RN

## 2017-04-01 LAB — CULTURE, BLOOD (ROUTINE X 2)
Culture: NO GROWTH
Culture: NO GROWTH
SPECIAL REQUESTS: ADEQUATE
Special Requests: ADEQUATE

## 2017-04-08 ENCOUNTER — Ambulatory Visit (INDEPENDENT_AMBULATORY_CARE_PROVIDER_SITE_OTHER): Payer: Self-pay | Admitting: General Surgery

## 2017-04-08 ENCOUNTER — Encounter: Payer: Self-pay | Admitting: General Surgery

## 2017-04-08 VITALS — BP 185/111 | HR 84 | Temp 97.1°F | Resp 18 | Ht 65.0 in | Wt 165.0 lb

## 2017-04-08 DIAGNOSIS — K8000 Calculus of gallbladder with acute cholecystitis without obstruction: Secondary | ICD-10-CM

## 2017-04-08 DIAGNOSIS — K611 Rectal abscess: Secondary | ICD-10-CM

## 2017-04-08 MED ORDER — CLINDAMYCIN HCL 300 MG PO CAPS: 300.0000 mg | ORAL_CAPSULE | Freq: Three times a day (TID) | ORAL | 0 refills | Status: AC

## 2017-04-08 MED ORDER — ONDANSETRON 4 MG PO TBDP: 4.0000 mg | ORAL_TABLET | Freq: Three times a day (TID) | ORAL | 0 refills | Status: DC | PRN

## 2017-04-08 NOTE — Patient Instructions (Signed)
Continue to wear pad as needed. Take new antibiotic.

## 2017-04-08 NOTE — Progress Notes (Signed)
[facility] Note   HPI:  61 y.o. Female presents to clinic for post-op follow-up evaluation after a laparoscopic cholecystectomy and incision and drainage with drain placement of a large perirectal abscess.  She has been having some issues with the amoxicillin making her nausated she thought but remains nausated after stopping the antibiotic. She has been having some night sweats. She does not see a PCP regularly and her BP was up today and she was given a Rx for the metformin in the hospital, but reports [facility] and she needs to find a new PCP.   Review of Systems:  Night sweats Drainage from perirectal drains  Nausea  Lower abdominal tenderness All other review of systems: otherwise negative   Pathology: Diagnosis Gallbladder - CHRONIC CHOLECYSTITIS WITH CHOLELITHIASIS.  Vital Signs:  BP (!) 185/111   Pulse 84   Temp (!) 97.1 F (36.2 C)   Resp 18   Ht 5\' 5"  (1.651 m)   Wt 165 lb (74.8 kg)   BMI 27.46 kg/m    Physical Exam:  Physical Exam  Constitutional: She is well-developed, well-nourished, and in no distress.  HENT:  Head: Normocephalic.  Eyes: Pupils are equal, round, and reactive to light.  Cardiovascular: Normal rate.  Pulmonary/Chest: Effort normal.  Abdominal: Soft. There is tenderness in the right lower quadrant.  Port sites c/d/i with dermabond, no erythema or drainage  Genitourinary:  Genitourinary Comments: Drain catheter in place right perirectal and right labial region, soft, purulent drainage noted, perirectal drain removed and labial drain remains, no erythema   Skin: Skin is dry.  Vitals reviewed.   Laboratory studies: None   Imaging:  None   Assessment:  61 y.o. yo Female post op after laparoscopic cholecystectomy for chronic cholecystitis and perirectal abscess drainage. Having some nausea which could be post operative versus related to her medications. She is able to eat and having BMs that are loose,  which again can be related to the gallbladder surgery or antibiotics. One drain removed but continues to have drainage and is diabetic, so will leave other drain at the labial portion in place for now.   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 Tracie HarrierHagler for PCP in order to work with BP and to continue to prescribe metformin/ diabetes medications   All of the above recommendations were discussed with the patient, and all of patient's questions were answered to her expressed satisfaction.  Algis GreenhouseLindsay Palmer Shorey, MD [facility] [address] [place], [place] [id number] [phone] (office)

## 2017-04-13 ENCOUNTER — Telehealth: Payer: Self-pay | Admitting: General Surgery

## 2017-04-13 NOTE — Telephone Encounter (Signed)
Attempted two calls. Message left of first call.   Algis GreenhouseLindsay Bridges, MD [facility] [address] [place], [place] [phone] (office)

## 2017-04-15 ENCOUNTER — Ambulatory Visit (INDEPENDENT_AMBULATORY_CARE_PROVIDER_SITE_OTHER): Payer: Self-pay | Admitting: General Surgery

## 2017-04-15 ENCOUNTER — Encounter: Payer: Self-pay | Admitting: General Surgery

## 2017-04-15 VITALS — BP 148/92 | HR 87 | Temp 97.1°F | Resp 18 | Ht 65.0 in | Wt 162.0 lb

## 2017-04-15 DIAGNOSIS — K8000 Calculus of gallbladder with acute cholecystitis without obstruction: Secondary | ICD-10-CM

## 2017-04-15 DIAGNOSIS — K611 Rectal abscess: Secondary | ICD-10-CM

## 2017-04-15 MED ORDER — PROBIOTIC 250 MG PO CAPS: 1.0000 | ORAL_CAPSULE | Freq: Two times a day (BID) | ORAL | 0 refills | Status: DC

## 2017-04-15 MED ORDER — PANTOPRAZOLE SODIUM 40 MG PO TBEC: 40.0000 mg | DELAYED_RELEASE_TABLET | Freq: Every day | ORAL | 2 refills | Status: DC

## 2017-04-15 NOTE — Progress Notes (Signed)
[facility] Note   HPI:  61 y.o. Female presents to clinic recheck of her right buttock/ labial abscess drain site. She is doing poorly complaining of nausea and lower abdominal pain. The zofran does not help. She says she feels dehydrated but is urinating. Says her urine is dark. She reports having BMs that are not diarrhea but goes daily. Finishing up clindamycin tomorrow.   Review of Systems:  Lower abdominal pain Nausea but intermittent vomiting  All other review of systems: otherwise negative   Vital Signs:  BP (!) 148/92   Pulse 87   Temp (!) 97.1 F (36.2 C)   Resp 18   Ht 5\' 5"  (1.651 m)   Wt 162 lb (73.5 kg)   BMI 26.96 kg/m    Physical Exam:  Physical Exam  Constitutional: She is oriented to person, place, and time and well-developed, well-nourished, and in no distress.  HENT:  Head: Normocephalic.  Cardiovascular: Normal rate.  Pulmonary/Chest: Effort normal.  Abdominal: Soft. She exhibits no distension. There is no tenderness.  Genitourinary:  Genitourinary Comments: Right buttock soft, penrose drain in right labia is less indurated, minimal drainage   Musculoskeletal: Normal range of motion.  Neurological: She is alert and oriented to person, place, and time.  Skin: Skin is warm and dry.  Vitals reviewed.   Laboratory studies: None   Imaging:  None   Assessment:  61 y.o. yo Female with nausea and lower abdominal pain after laparoscopic cholecystectomy and incision and drainage of right buttock and labial abscess with penrose drain placement. The remaining drain was removed today. She is having some issues with nausea and the zofran is not helping. She is finishing up a clindamycin prescription for the buttock abscess and this could be causing some of the lower abdominal pain.   Plan:  - Wrote for the patient to get labs, but called by [facility] because patient has no money and no ability to get labs paid for, wanted CBC, CMP, lipase to  assess after cholecystectomy to make sure not missing anything   - Prescribed probiotics and protonix for now, told her to use over the counter if cheaper  - Follow up next week for wound check and and see if nausea / pain improved and if still with issues, may have to go get labs in ED  - Also referred patient again to PCP in order to get her other medications like her diabetes meds which she will be needing refills for soon   Algis GreenhouseLindsay Bridges, MD [facility] [address] [place], [place] [id number] [phone] (office)

## 2017-04-15 NOTE — Patient Instructions (Addendum)
Expect some drainage from the bottom. Change gauze as needed. Stay hydrated, drink gatoraid/ Poweraid/ water.  Take the probiotic to help restore normal bacteria in your intestines after the antibiotics. You can also get this over the counter if cheaper or take Activa yogurt. Take the protonix daily for indigestion/ reflux. If it is cheaper over the counter, the get this option.  Schedule to see Dr. Tracie HarrierHagler for your new PCP, if you do not plan to go back to [facility] [place] [facility]. [phone]

## 2017-04-23 ENCOUNTER — Other Ambulatory Visit: Payer: Self-pay

## 2017-04-23 ENCOUNTER — Encounter (HOSPITAL_COMMUNITY): Payer: Self-pay | Admitting: Emergency Medicine

## 2017-04-23 ENCOUNTER — Emergency Department (HOSPITAL_COMMUNITY): Payer: Self-pay

## 2017-04-23 ENCOUNTER — Emergency Department (HOSPITAL_COMMUNITY)
Admission: EM | Admit: 2017-04-23 | Discharge: 2017-04-23 | Disposition: A | Discharge status: Home / Self Care | Payer: Self-pay | Attending: Emergency Medicine | Admitting: Emergency Medicine

## 2017-04-23 DIAGNOSIS — R1032 Left lower quadrant pain: Secondary | ICD-10-CM | POA: Insufficient documentation

## 2017-04-23 DIAGNOSIS — F17210 Nicotine dependence, cigarettes, uncomplicated: Secondary | ICD-10-CM | POA: Insufficient documentation

## 2017-04-23 DIAGNOSIS — E119 Type 2 diabetes mellitus without complications: Secondary | ICD-10-CM | POA: Insufficient documentation

## 2017-04-23 DIAGNOSIS — Z7984 Long term (current) use of oral hypoglycemic drugs: Secondary | ICD-10-CM | POA: Insufficient documentation

## 2017-04-23 DIAGNOSIS — E039 Hypothyroidism, unspecified: Secondary | ICD-10-CM | POA: Insufficient documentation

## 2017-04-23 DIAGNOSIS — Z79899 Other long term (current) drug therapy: Secondary | ICD-10-CM | POA: Insufficient documentation

## 2017-04-23 LAB — CBC
HCT: 46.5 % — ABNORMAL HIGH (ref 36.0–46.0)
Hemoglobin: 15.5 g/dL — ABNORMAL HIGH (ref 12.0–15.0)
MCH: 30.3 pg (ref 26.0–34.0)
MCHC: 33.3 g/dL (ref 30.0–36.0)
MCV: 91 fL (ref 78.0–100.0)
PLATELETS: 121 10*3/uL — AB (ref 150–400)
RBC: 5.11 MIL/uL (ref 3.87–5.11)
RDW: 13.3 % (ref 11.5–15.5)
WBC: 10.4 10*3/uL (ref 4.0–10.5)

## 2017-04-23 LAB — URINALYSIS, ROUTINE W REFLEX MICROSCOPIC
BILIRUBIN URINE: NEGATIVE
Bacteria, UA: NONE SEEN
Glucose, UA: >=500 mg/dL — AB
Ketones, ur: NEGATIVE mg/dL
LEUKOCYTES UA: NEGATIVE
NITRITE: NEGATIVE
PH: 5 (ref 5.0–8.0)
Protein, ur: 30 mg/dL — AB
SPECIFIC GRAVITY, URINE: 1.025 (ref 1.005–1.030)

## 2017-04-23 LAB — COMPREHENSIVE METABOLIC PANEL
ALK PHOS: 132 U/L — AB (ref 38–126)
ALT: 22 U/L (ref 14–54)
AST: 19 U/L (ref 15–41)
Albumin: 3.8 g/dL (ref 3.5–5.0)
Anion gap: 12 (ref 5–15)
BUN: 10 mg/dL (ref 6–20)
CALCIUM: 9.9 mg/dL (ref 8.9–10.3)
CO2: 24 mmol/L (ref 22–32)
CREATININE: 0.45 mg/dL (ref 0.44–1.00)
Chloride: 102 mmol/L (ref 101–111)
Glucose, Bld: 286 mg/dL — ABNORMAL HIGH (ref 65–99)
Potassium: 3.9 mmol/L (ref 3.5–5.1)
Sodium: 138 mmol/L (ref 135–145)
Total Bilirubin: 0.5 mg/dL (ref 0.3–1.2)
Total Protein: 7.2 g/dL (ref 6.5–8.1)

## 2017-04-23 LAB — LIPASE, BLOOD: LIPASE: 18 U/L (ref 11–51)

## 2017-04-23 MED ORDER — KETOROLAC TROMETHAMINE 30 MG/ML IJ SOLN
15.0000 mg | Freq: Once | INTRAMUSCULAR | Status: AC
  Administered 2017-04-23: 15 mg via INTRAVENOUS

## 2017-04-23 MED ORDER — KETOROLAC TROMETHAMINE 30 MG/ML IJ SOLN
30.0000 mg | Freq: Once | INTRAMUSCULAR | Status: DC
  Filled 2017-04-23: qty 1

## 2017-04-23 MED ORDER — IOPAMIDOL (ISOVUE-300) INJECTION 61%
100.0000 mL | Freq: Once | INTRAVENOUS | Status: AC | PRN
  Administered 2017-04-23: 100 mL via INTRAVENOUS

## 2017-04-23 NOTE — ED Provider Notes (Signed)
[facility] EMERGENCY DEPARTMENT Provider Note   CSN: [id number] Arrival date & time: 04/23/17  1805     History   Chief Complaint Chief Complaint  Patient presents with  . Groin Pain    HPI LADAYSHA SOUTAR is a 61 y.o. female.  HPI  61 year old female, the patient is status post cholecystectomy which was performed recently, the patient had done initially somewhat poorly stating that she was having daily nausea and discomfort but this gradually improved and recently she went back to work because she was feeling better however after going back to work the patient noticed that she had some progressive and worsening left lower quadrant pain with an associated feeling of feeling really hot and sweaty at night.  She denies coughing or shortness of breath, denies swelling of the legs and has no dysuria or diarrhea.  This pain is also worse with palpation, it is located in the left lower quadrant, there is no pain in the groin, no swelling of the legs, no rashes of the skin.  She has been able to eat and drink now and is not having any pain with eating  Past Medical History:  Diagnosis Date  . Ankle fracture 02/18/2016   left  . Borderline diabetes   . Complication of anesthesia    "fighting with anesthesia when waking up"  . Diverticulosis   . Headache   . History of kidney stones   . Hypothyroid     Patient Active Problem List   Diagnosis Date Noted  . Perirectal abscess   . Calculus of gallbladder with acute cholecystitis without obstruction   . Sepsis (HCC) 03/27/2017  . RUQ pain 03/27/2017  . Hyponatremia 03/27/2017  . Thrombocytopenia (HCC) 03/27/2017  . Diabetes mellitus type II, non insulin dependent (HCC) 03/27/2017  . Adrenal nodule (HCC) 03/27/2017    Past Surgical History:  Procedure Laterality Date  . CHOLECYSTECTOMY N/A 03/29/2017   Procedure: LAPAROSCOPIC CHOLECYSTECTOMY;  Surgeon: Lucretia Roers, MD;  Location: [facility];  Service: General;  Laterality:  N/A;  . CYST REMOVAL NECK     x3  . DIAGNOSTIC LAPAROSCOPY     as teenager  . INCISION AND DRAINAGE PERIRECTAL ABSCESS  03/29/2017   Procedure: INCISION  AND DRAINAGE PERIRECTAL ABSCESS;  Surgeon: Lucretia Roers, MD;  Location: [facility];  Service: General;;  . ORIF ANKLE FRACTURE Left 02/28/2016   Procedure: OPEN REDUCTION INTERNAL FIXATION (ORIF) ANKLE FRACTURE, LEFT;  Surgeon: Sheral Apley, MD;  Location:  SURGERY CENTER;  Service: Orthopedics;  Laterality: Left;  . TONSILLECTOMY     as child    OB History    No data available       Home Medications    Prior to Admission medications   Medication Sig Start Date End Date Taking? Authorizing Provider  ibuprofen (ADVIL,MOTRIN) 200 MG tablet Take 400-600 mg by mouth every 6 (six) hours as needed for mild pain or moderate pain.    Yes [provider]  ondansetron (ZOFRAN ODT) 4 MG disintegrating tablet Take 1 tablet (4 mg total) by mouth every 8 (eight) hours as needed for nausea or vomiting. 04/08/17  Yes Lucretia Roers, MD  metFORMIN (GLUCOPHAGE) 500 MG tablet Take 1 tablet (500 mg total) by mouth 2 (two) times daily with a meal. Patient not taking: Reported on 04/23/2017 03/30/17   Erick Blinks, MD  oxyCODONE (OXY IR/ROXICODONE) 5 MG immediate release tablet Take 1 tablet (5 mg total) by mouth every 4 (four)  hours as needed for severe pain or breakthrough pain. Patient not taking: Reported on 04/23/2017 03/29/17   Lucretia RoersBridges, Lindsay C, MD  pantoprazole (PROTONIX) 40 MG tablet Take 1 tablet (40 mg total) by mouth daily. Patient not taking: Reported on 04/23/2017 04/15/17   Lucretia RoersBridges, Lindsay C, MD  Saccharomyces boulardii (PROBIOTIC) 250 MG CAPS Take 1 tablet by mouth 2 (two) times daily. Patient not taking: Reported on 04/23/2017 04/15/17   Lucretia RoersBridges, Lindsay C, MD    Family History Family History  Problem Relation Age of Onset  . Diabetes Mother   . Other Mother   . Dementia Mother   . Cancer Father   .  Heart attack Father   . Diabetes Father   . Other Brother     Social History Social History   Tobacco Use  . Smoking status: Current Every Day Smoker    Packs/day: 0.50    Years: 20.00    Pack years: 10.00    Types: Cigarettes  . Smokeless tobacco: Never Used  Substance Use Topics  . Alcohol use: Yes    Comment: rare use of liquor 3 times yearly   . Drug use: No     Allergies   Anesthesia s-i-60   Review of Systems Review of Systems  All other systems reviewed and are negative.    Physical Exam Updated Vital Signs BP (!) 149/107 (BP Location: Right Arm)   Pulse 91   Temp 97.9 F (36.6 C) (Oral)   Resp 16   Ht 5\' 5"  (1.651 m)   Wt 73.5 kg (162 lb)   SpO2 98%   BMI 26.96 kg/m   Physical Exam  Constitutional: She appears well-developed and well-nourished. No distress.  HENT:  Head: Normocephalic and atraumatic.  Mouth/Throat: Oropharynx is clear and moist. No oropharyngeal exudate.  Eyes: Conjunctivae and EOM are normal. Pupils are equal, round, and reactive to light. Right eye exhibits no discharge. Left eye exhibits no discharge. No scleral icterus.  Neck: Normal range of motion. Neck supple. No JVD present. No thyromegaly present.  Cardiovascular: Normal rate, regular rhythm, normal heart sounds and intact distal pulses. Exam reveals no gallop and no friction rub.  No murmur heard. Pulmonary/Chest: Effort normal and breath sounds normal. No respiratory distress. She has no wheezes. She has no rales.  Abdominal: Soft. Bowel sounds are normal. She exhibits no distension and no mass. There is tenderness ( Focal tenderness to palpation in the left lower quadrant just superior to the inguinal ligament, there is no obvious masses though the patient is somewhat obese and has excessive body tissue in this area).  Musculoskeletal: Normal range of motion. She exhibits no edema or tenderness.  Lymphadenopathy:    She has no cervical adenopathy.  Neurological: She is  alert. Coordination normal.  Skin: Skin is warm and dry. No rash noted. No erythema.  Psychiatric: She has a normal mood and affect. Her behavior is normal.  Nursing note and vitals reviewed.    ED Treatments / Results  Labs (all labs ordered are listed, but only abnormal results are displayed) Labs Reviewed  COMPREHENSIVE METABOLIC PANEL - Abnormal; Notable for the following components:      Result Value   Glucose, Bld 286 (*)    Alkaline Phosphatase 132 (*)    All other components within normal limits  CBC - Abnormal; Notable for the following components:   Hemoglobin 15.5 (*)    HCT 46.5 (*)    Platelets 121 (*)    All  other components within normal limits  URINALYSIS, ROUTINE W REFLEX MICROSCOPIC - Abnormal; Notable for the following components:   APPearance CLOUDY (*)    Glucose, UA >=500 (*)    Hgb urine dipstick LARGE (*)    Protein, ur 30 (*)    Squamous Epithelial / LPF 0-5 (*)    All other components within normal limits  LIPASE, BLOOD     Radiology Ct Abdomen Pelvis W Contrast  Result Date: 04/23/2017 CLINICAL DATA:  Pain to the right groin for 2 weeks EXAM: CT ABDOMEN AND PELVIS WITH CONTRAST TECHNIQUE: Multidetector CT imaging of the abdomen and pelvis was performed using the standard protocol following bolus administration of intravenous contrast. CONTRAST:  ISOVUE-300 IOPAMIDOL (ISOVUE-300) INJECTION 61% COMPARISON:  03/27/2017, 01/14/2004 FINDINGS: Lower chest: Stable small lung base nodules. No acute consolidation or pleural effusion. Heart size within normal limits. Hepatobiliary: Stable hypodense lesion in the anterior dome of the liver. Additional subcentimeter hypodense liver lesions too small to further characterize the grossly unchanged. Interval surgical clips at the gallbladder fossa. Oblong shaped fluid collection with tiny focus of gas at the gallbladder fossa measuring 4.6 cm longitudinal by 2 cm thick. No significant biliary dilatation Pancreas:  Unremarkable. No pancreatic ductal dilatation or surrounding inflammatory changes. Spleen: Normal in size without focal abnormality. Adrenals/Urinary Tract: Right adrenal gland is normal. Re- demonstrated 13 mm left adrenal nodule. Kidneys show no hydronephrosis. 3 mm stone in the proximal right ureter, just past the UPJ. Bladder nearly empty Stomach/Bowel: Stomach is within normal limits. Appendix appears normal. No evidence of bowel wall thickening, distention, or inflammatory changes. Sigmoid colon diverticular disease without acute inflammation Vascular/Lymphatic: No significant vascular findings are present. No enlarged abdominal or pelvic lymph nodes. Reproductive: Uterus and bilateral adnexa are unremarkable. Other: Negative for free air or free fluid. Soft tissue thickening in the umbilical region with small hernia. Musculoskeletal: Degenerative changes. No acute or suspicious lesion IMPRESSION: 1. 3 mm stone in the proximal right ureter just past the UPJ. No significant hydronephrosis. 2. Stable bilateral pulmonary lung base nodules, felt benign 3. 13 mm left adrenal gland nodule. This is new since 2005. Nonemergent adrenal CT follow-up recommended. 4. Interval surgical clips at the gallbladder fossa. Ovoid fluid collection with small focus of gas at the gallbladder fossa presumably represents a post- operative collection. Electronically Signed   By: Jasmine Pang M.D.   On: 04/23/2017 23:26    Procedures Procedures (including critical care time)  Medications Ordered in ED Medications  iopamidol (ISOVUE-300) 61 % injection 100 mL (100 mLs Intravenous Contrast Given 04/23/17 2241)     Initial Impression / Assessment and Plan / ED Course  I have reviewed the triage vital signs and the nursing notes.  Pertinent labs & imaging results that were available during my care of the patient were reviewed by me and considered in my medical decision making (see chart for details).     The patient's  pain is located down lower in the left lower quadrant, this could be consistent with gynecoid urinary pathology though I suspect it is more gastro-related consider diverticulitis, could also be a muscle strain of the abdominal wall.  Her labs have been reviewed and thus far do not show any specific findings including normal renal function and no leukocytosis.  Labs reassuring, CT scan unremarkable, there is some blood in the urine which may be related to the kidney stone on the right however the patient has absolutely no right-sided tenderness.  It is very  small and should pass spontaneously.  She was informed of her CT scan results and at this time appears stable for discharge  Final Clinical Impressions(s) / ED Diagnoses   Final diagnoses:  Left lower quadrant pain    ED Discharge Orders    None       Eber HongMiller, Ariah Mower, MD 04/23/17 2337

## 2017-04-23 NOTE — ED Notes (Signed)
Pt notified of urine sample needed, couldn't provide one at the time but will notify when able to go.

## 2017-04-23 NOTE — Discharge Instructions (Signed)
Your CT scan shows no abnormal findings down on the left lower abdomen where you are hurting.  Have an incidental small kidney stone that may pass over the next couple of weeks on the right side.  Please return to the emergency department for severe or worsening pain or vomiting.  Please obtain your records and follow-up with your family doctor to review all of the CT scan findings in the next 2 weeks as well.  Your blood work has been unremarkable, you may take Tylenol or ibuprofen for pain.  Please obtain all of your results from medical records or have your doctors office obtain the results - share them with your doctor - you should be seen at your doctors office in the next 2 days. Call today to arrange your follow up. Take the medications as prescribed. Please review all of the medicines and only take them if you do not have an allergy to them. Please be aware that if you are taking birth control pills, taking other prescriptions, ESPECIALLY ANTIBIOTICS may make the birth control ineffective - if this is the case, either do not engage in sexual activity or use alternative methods of birth control such as condoms until you have finished the medicine and your family doctor says it is OK to restart them. If you are on a blood thinner such as COUMADIN, be aware that any other medicine that you take may cause the coumadin to either work too much, or not enough - you should have your coumadin level rechecked in next 7 days if this is the case.  ?  It is also a possibility that you have an allergic reaction to any of the medicines that you have been prescribed - Everybody reacts differently to medications and while MOST people have no trouble with most medicines, you may have a reaction such as nausea, vomiting, rash, swelling, shortness of breath. If this is the case, please stop taking the medicine immediately and contact your physician.  ?  You should return to the ER if you develop severe or worsening  symptoms.

## 2017-04-23 NOTE — ED Triage Notes (Signed)
Pain to rt groin on and off x 2 weeks. Denies urinary s/s

## 2017-04-29 ENCOUNTER — Emergency Department (HOSPITAL_COMMUNITY): Payer: Self-pay

## 2017-04-29 ENCOUNTER — Encounter (HOSPITAL_COMMUNITY): Payer: Self-pay | Admitting: Emergency Medicine

## 2017-04-29 ENCOUNTER — Emergency Department (HOSPITAL_COMMUNITY)
Admission: EM | Admit: 2017-04-29 | Discharge: 2017-04-29 | Disposition: A | Discharge status: Home / Self Care | Payer: Self-pay | Attending: Emergency Medicine | Admitting: Emergency Medicine

## 2017-04-29 DIAGNOSIS — E119 Type 2 diabetes mellitus without complications: Secondary | ICD-10-CM | POA: Insufficient documentation

## 2017-04-29 DIAGNOSIS — Z79899 Other long term (current) drug therapy: Secondary | ICD-10-CM | POA: Insufficient documentation

## 2017-04-29 DIAGNOSIS — F17210 Nicotine dependence, cigarettes, uncomplicated: Secondary | ICD-10-CM | POA: Insufficient documentation

## 2017-04-29 DIAGNOSIS — N10 Acute tubulo-interstitial nephritis: Secondary | ICD-10-CM | POA: Insufficient documentation

## 2017-04-29 DIAGNOSIS — N201 Calculus of ureter: Secondary | ICD-10-CM | POA: Insufficient documentation

## 2017-04-29 DIAGNOSIS — E039 Hypothyroidism, unspecified: Secondary | ICD-10-CM | POA: Insufficient documentation

## 2017-04-29 LAB — CBC WITH DIFFERENTIAL/PLATELET
BASOS PCT: 0 %
Basophils Absolute: 0 10*3/uL (ref 0.0–0.1)
EOS ABS: 0.2 10*3/uL (ref 0.0–0.7)
EOS PCT: 1 %
HCT: 45.5 % (ref 36.0–46.0)
HEMOGLOBIN: 15.1 g/dL — AB (ref 12.0–15.0)
LYMPHS ABS: 1.6 10*3/uL (ref 0.7–4.0)
Lymphocytes Relative: 9 %
MCH: 30.3 pg (ref 26.0–34.0)
MCHC: 33.2 g/dL (ref 30.0–36.0)
MCV: 91.4 fL (ref 78.0–100.0)
MONO ABS: 0.8 10*3/uL (ref 0.1–1.0)
Monocytes Relative: 4 %
Neutro Abs: 16.2 10*3/uL — ABNORMAL HIGH (ref 1.7–7.7)
Neutrophils Relative %: 86 %
PLATELETS: 137 10*3/uL — AB (ref 150–400)
RBC: 4.98 MIL/uL (ref 3.87–5.11)
RDW: 13.6 % (ref 11.5–15.5)
WBC: 18.8 10*3/uL — ABNORMAL HIGH (ref 4.0–10.5)

## 2017-04-29 LAB — URINALYSIS, ROUTINE W REFLEX MICROSCOPIC
BILIRUBIN URINE: NEGATIVE
Glucose, UA: 150 mg/dL — AB
KETONES UR: 5 mg/dL — AB
NITRITE: NEGATIVE
PH: 5 (ref 5.0–8.0)
Protein, ur: 100 mg/dL — AB
SPECIFIC GRAVITY, URINE: 1.027 (ref 1.005–1.030)

## 2017-04-29 LAB — BASIC METABOLIC PANEL
Anion gap: 12 (ref 5–15)
BUN: 12 mg/dL (ref 6–20)
CALCIUM: 9.9 mg/dL (ref 8.9–10.3)
CHLORIDE: 102 mmol/L (ref 101–111)
CO2: 24 mmol/L (ref 22–32)
CREATININE: 0.51 mg/dL (ref 0.44–1.00)
GFR calc Af Amer: >60 mL/min (ref >60)
GFR calc non Af Amer: >60 mL/min (ref >60)
GLUCOSE: 275 mg/dL — AB (ref 65–99)
Potassium: 3.5 mmol/L (ref 3.5–5.1)
Sodium: 138 mmol/L (ref 135–145)

## 2017-04-29 MED ORDER — CEPHALEXIN 500 MG PO CAPS: 500.0000 mg | ORAL_CAPSULE | Freq: Four times a day (QID) | ORAL | 0 refills | Status: DC

## 2017-04-29 MED ORDER — OXYCODONE-ACETAMINOPHEN 5-325 MG PO TABS: 1.0000 | ORAL_TABLET | ORAL | 0 refills | Status: DC | PRN

## 2017-04-29 MED ORDER — SODIUM CHLORIDE 0.9 % IV BOLUS (SEPSIS)
1000.0000 mL | Freq: Once | INTRAVENOUS | Status: AC
  Administered 2017-04-29: 1000 mL via INTRAVENOUS

## 2017-04-29 MED ORDER — DEXTROSE 5 % IV SOLN
1.0000 g | Freq: Once | INTRAVENOUS | Status: AC
  Administered 2017-04-29: 1 g via INTRAVENOUS
  Filled 2017-04-29: qty 10

## 2017-04-29 MED ORDER — HYDROMORPHONE HCL 1 MG/ML IJ SOLN
0.5000 mg | Freq: Once | INTRAMUSCULAR | Status: AC
  Administered 2017-04-29: 0.5 mg via INTRAVENOUS

## 2017-04-29 MED ORDER — KETOROLAC TROMETHAMINE 30 MG/ML IJ SOLN: 30.0000 mg | Freq: Once | INTRAMUSCULAR | Status: DC

## 2017-04-29 MED ORDER — HYDROMORPHONE HCL 1 MG/ML IJ SOLN
1.0000 mg | Freq: Once | INTRAMUSCULAR | Status: AC
  Administered 2017-04-29: 1 mg via INTRAVENOUS
  Filled 2017-04-29: qty 1

## 2017-04-29 MED ORDER — ONDANSETRON HCL 4 MG/2ML IJ SOLN
4.0000 mg | Freq: Once | INTRAMUSCULAR | Status: AC
  Administered 2017-04-29: 4 mg via INTRAVENOUS
  Filled 2017-04-29: qty 2

## 2017-04-29 MED ORDER — HYDROMORPHONE HCL 1 MG/ML IJ SOLN
INTRAMUSCULAR | Status: AC
  Administered 2017-04-29: 0.5 mg via INTRAVENOUS
  Filled 2017-04-29: qty 1

## 2017-04-29 MED ORDER — ONDANSETRON 4 MG PO TBDP: 4.0000 mg | ORAL_TABLET | Freq: Three times a day (TID) | ORAL | 0 refills | Status: DC | PRN

## 2017-04-29 MED ORDER — KETOROLAC TROMETHAMINE 30 MG/ML IJ SOLN
30.0000 mg | Freq: Once | INTRAMUSCULAR | Status: AC
  Administered 2017-04-29: 30 mg via INTRAVENOUS
  Filled 2017-04-29: qty 1

## 2017-04-29 NOTE — ED Notes (Signed)
US in with pt. 

## 2017-04-29 NOTE — ED Notes (Signed)
PT requesting pain medication. EDP made aware.

## 2017-04-29 NOTE — ED Notes (Signed)
PA in with pt. Pt grimacing.

## 2017-04-29 NOTE — Discharge Instructions (Signed)
Take 2 more doses of your antibiotic today as discussed.  Use the pain medication and the nausea medication as needed for your symptoms.  As discussed you need to return here immediately for any fever, worse pain, uncontrolled vomiting or weakness.  Make sure you are drinking plenty of fluids.  Call the urology group listed above for an office visit for follow-up within the next week.

## 2017-04-29 NOTE — ED Provider Notes (Signed)
[name] EMERGENCY DEPARTMENT Provider Note   CSN: [id number] Arrival date & time: 04/29/17  0846     History   Chief Complaint Chief Complaint  Patient presents with  . Flank Pain    Maria Leblanc is a 62 y.o. female presenting with persistent right flank pain for the past 2 days along with nausea with emesis which started today.  She was seen here 5 days ago actually for left abdominal pain which has resolved.  However during that evaluation a CT scan revealed a right 3 mm proximal ureteral stone.  She does have a history of kidney stones.  She denies fevers or chills but does endorse significant pain preventing her from sleep.  She has had hematuria along with dysuria symptoms as well.  She is found no alleviators for her symptoms.  She does not currently have a urologist, stating she has had 5 stones in the past, but they occur so infrequently she has not required a urologist.   Maria  Past Medical History:  Diagnosis Date  . Ankle fracture 02/18/2016   left  . Borderline diabetes   . Complication of anesthesia    "fighting with anesthesia when waking up"  . Diverticulosis   . Headache   . History of kidney stones   . Hypothyroid     Patient Active Problem List   Diagnosis Date Noted  . Perirectal abscess   . Calculus of gallbladder with acute cholecystitis without obstruction   . Sepsis (HCC) 03/27/2017  . RUQ pain 03/27/2017  . Hyponatremia 03/27/2017  . Thrombocytopenia (HCC) 03/27/2017  . Diabetes mellitus type II, non insulin dependent (HCC) 03/27/2017  . Adrenal nodule (HCC) 03/27/2017    Past Surgical History:  Procedure Laterality Date  . CHOLECYSTECTOMY N/A 03/29/2017   Procedure: LAPAROSCOPIC CHOLECYSTECTOMY;  Surgeon: Lucretia Roers, MD;  Location: [facility];  Service: General;  Laterality: N/A;  . CYST REMOVAL NECK     x3  . DIAGNOSTIC LAPAROSCOPY     as teenager  . INCISION AND DRAINAGE PERIRECTAL ABSCESS  03/29/2017   Procedure: INCISION   AND DRAINAGE PERIRECTAL ABSCESS;  Surgeon: Lucretia Roers, MD;  Location: [facility];  Service: General;;  . ORIF ANKLE FRACTURE Left 02/28/2016   Procedure: OPEN REDUCTION INTERNAL FIXATION (ORIF) ANKLE FRACTURE, LEFT;  Surgeon: Sheral Apley, MD;  Location: [facility];  Service: Orthopedics;  Laterality: Left;  . TONSILLECTOMY     as child    OB History    No data available       Home Medications    Prior to Admission medications   Medication Sig Start Date End Date Taking? Authorizing Provider  ibuprofen (ADVIL,MOTRIN) 200 MG tablet Take 400-600 mg by mouth every 6 (six) hours as needed for mild pain or moderate pain.    Yes [provider]  cephALEXin (KEFLEX) 500 MG capsule Take 1 capsule (500 mg total) by mouth 4 (four) times daily. 04/29/17   Burgess Amor, PA-C  metFORMIN (GLUCOPHAGE) 500 MG tablet Take 1 tablet (500 mg total) by mouth 2 (two) times daily with a meal. Patient not taking: Reported on 04/23/2017 03/30/17   Erick Blinks, MD  ondansetron (ZOFRAN ODT) 4 MG disintegrating tablet Take 1 tablet (4 mg total) by mouth every 8 (eight) hours as needed for nausea or vomiting. 04/29/17   Burgess Amor, PA-C  oxyCODONE (OXY IR/ROXICODONE) 5 MG immediate release tablet Take 1 tablet (5 mg total) by mouth every 4 (four)  hours as needed for severe pain or breakthrough pain. Patient not taking: Reported on 04/23/2017 03/29/17   Lucretia Roers, MD  oxyCODONE-acetaminophen (PERCOCET/ROXICET) 5-325 MG tablet Take 1 tablet by mouth every 4 (four) hours as needed. 04/29/17   Burgess Amor, PA-C  pantoprazole (PROTONIX) 40 MG tablet Take 1 tablet (40 mg total) by mouth daily. Patient not taking: Reported on 04/23/2017 04/15/17   Lucretia Roers, MD  Saccharomyces boulardii (PROBIOTIC) 250 MG CAPS Take 1 tablet by mouth 2 (two) times daily. Patient not taking: Reported on 04/23/2017 04/15/17   Lucretia Roers, MD    Family History Family History  Problem  Relation Age of Onset  . Diabetes Mother   . Other Mother   . Dementia Mother   . Cancer Father   . Heart attack Father   . Diabetes Father   . Other Brother     Social History Social History   Tobacco Use  . Smoking status: Current Every Day Smoker    Packs/day: 0.50    Years: 20.00    Pack years: 10.00    Types: Cigarettes  . Smokeless tobacco: Never Used  Substance Use Topics  . Alcohol use: Yes    Comment: rare use of liquor 3 times yearly   . Drug use: No     Allergies   Anesthesia s-i-60   Review of Systems Review of Systems  Constitutional: Negative for fever.  HENT: Negative for congestion and sore throat.   Eyes: Negative.   Respiratory: Negative for chest tightness and shortness of breath.   Cardiovascular: Negative for chest pain.  Gastrointestinal: Positive for nausea and vomiting. Negative for abdominal pain.  Genitourinary: Positive for dysuria and flank pain.  Musculoskeletal: Negative for arthralgias, joint swelling and neck pain.  Skin: Negative.  Negative for rash and wound.  Neurological: Negative for dizziness, weakness, light-headedness, numbness and headaches.  Psychiatric/Behavioral: Negative.      Physical Exam Updated Vital Signs BP 125/75   Pulse 68   Temp 98.2 F (36.8 C)   Resp 17   Ht 5\' 5"  (1.651 m)   Wt 73.5 kg (162 lb)   SpO2 93%   BMI 26.96 kg/m   Physical Exam  Constitutional: She appears well-developed and well-nourished.  HENT:  Head: Normocephalic and atraumatic.  Eyes: Conjunctivae are normal.  Neck: Normal range of motion.  Cardiovascular: Normal rate, regular rhythm, normal heart sounds and intact distal pulses.  Pulmonary/Chest: Effort normal and breath sounds normal. She has no wheezes.  Abdominal: Soft. Bowel sounds are normal. She exhibits no distension and no mass. There is CVA tenderness. There is no guarding.  Right CVA tenderness to palpation.  Musculoskeletal: Normal range of motion.  Neurological:  She is alert.  Skin: Skin is warm and dry.  Psychiatric: She has a normal mood and affect.  Nursing note and vitals reviewed.    ED Treatments / Results  Labs (all labs ordered are listed, but only abnormal results are displayed) Labs Reviewed  URINALYSIS, ROUTINE W REFLEX MICROSCOPIC - Abnormal; Notable for the following components:      Result Value   Color, Urine AMBER (*)    APPearance TURBID (*)    Glucose, UA 150 (*)    Hgb urine dipstick LARGE (*)    Ketones, ur 5 (*)    Protein, ur 100 (*)    Leukocytes, UA LARGE (*)    Bacteria, UA MANY (*)    Squamous Epithelial / LPF 6-30 (*)  All other components within normal limits  BASIC METABOLIC PANEL - Abnormal; Notable for the following components:   Glucose, Bld 275 (*)    All other components within normal limits  CBC WITH DIFFERENTIAL/PLATELET - Abnormal; Notable for the following components:   WBC 18.8 (*)    Hemoglobin 15.1 (*)    Platelets 137 (*)    Neutro Abs 16.2 (*)    All other components within normal limits  URINE CULTURE    EKG  EKG Interpretation None       Radiology Dg Abdomen 1 View  Result Date: 04/29/2017 CLINICAL DATA:  Two days of right flank pain with onset of vomiting today. Patient ports recently being treated for kidney stones. EXAM: ABDOMEN - 1 VIEW COMPARISON:  CT scan of the abdomen pelvis of April 23, 2017. FINDINGS: The previously demonstrated proximal right ureteral stone is not clearly evident. However, it may have migrated distally and lie at the right UVJ. No left-sided stones are observed. There degenerative changes of the lower lumbar spine. The bowel gas pattern is normal. IMPRESSION: The previously demonstrated right UPJ stone is not clearly evident. It may have migrated to the right UVJ. Electronically Signed   By: David  SwazilandJordan M.D.   On: 04/29/2017 13:42   [place] Renal  Result Date: 04/29/2017 CLINICAL DATA:  Flank pain EXAM: RENAL / URINARY TRACT ULTRASOUND COMPLETE  COMPARISON:  None. FINDINGS: Right Kidney: Length: 12 cm. Echogenicity within normal limits. No mass visualized. Mild hydronephrosis. Left Kidney: Length: 11.9 cm. Echogenicity within normal limits. No mass or hydronephrosis visualized. Bladder: Appears normal for degree of bladder distention. IMPRESSION: 1. Mild right hydronephrosis. 2. Normal left kidney. Electronically Signed   By: Elige KoHetal  Patel   On: 04/29/2017 13:38    Procedures Procedures (including critical care time)  Medications Ordered in ED Medications  sodium chloride 0.9 % bolus 1,000 mL (0 mLs Intravenous Stopped 04/29/17 1058)  ondansetron (ZOFRAN) injection 4 mg (4 mg Intravenous Given 04/29/17 0947)  HYDROmorphone (DILAUDID) injection 1 mg (1 mg Intravenous Given 04/29/17 0947)  sodium chloride 0.9 % bolus 1,000 mL (0 mLs Intravenous Stopped 04/29/17 1238)  ketorolac (TORADOL) 30 MG/ML injection 30 mg (30 mg Intravenous Given 04/29/17 1153)  cefTRIAXone (ROCEPHIN) 1 g in dextrose 5 % 50 mL IVPB (0 g Intravenous Stopped 04/29/17 1238)  HYDROmorphone (DILAUDID) injection 0.5 mg (0.5 mg Intravenous Given 04/29/17 1154)     Initial Impression / Assessment and Plan / ED Course  I have reviewed the triage vital signs and the nursing notes.  Pertinent labs & imaging results that were available during my care of the patient were reviewed by me and considered in my medical decision making (see chart for details).     Discussed case with Dr. Mena GoesEskridge who recommends a KUB and a renal ultrasound.  If there is no significant hydronephrosis and if pain is well controlled patient can follow-up as an outpatient.  Otherwise should plan for intervention/stenting although with the size of the stone she should be able to pass without need for any further intervention.  US and Kub reviewed, mild hydronephrosis, ? Stone still present per kub.  Pt pain and nausea free at this time.  She will be discharged home with strict return precautions for any  development of fever, worse pain, weakness or uncontrolled vomiting.  She was placed on Keflex, urine culture is currently pending.  Advised to take 2 more doses of this medication day.  Oxycodone and Zofran also prescribed for  symptom relief.  Referral to urology for follow-up care.  Patient discussed with Dr. Jacqulyn Bath prior to discharge home.  Final Clinical Impressions(s) / ED Diagnoses   Final diagnoses:  Ureterolithiasis  Acute pyelonephritis    ED Discharge Orders        Ordered    cephALEXin (KEFLEX) 500 MG capsule  4 times daily     04/29/17 1501    oxyCODONE-acetaminophen (PERCOCET/ROXICET) 5-325 MG tablet  Every 4 hours PRN     04/29/17 1501    ondansetron (ZOFRAN ODT) 4 MG disintegrating tablet  Every 8 hours PRN     04/29/17 1501       Burgess Amor, PA-C 04/29/17 1506    Long, Arlyss Repress, MD 04/29/17 1942

## 2017-04-29 NOTE — ED Triage Notes (Signed)
Patient complaining of right flank pain x 2 days with vomiting starting today. States she was treated here recently and treated for kidney stone.

## 2017-04-29 NOTE — ED Notes (Signed)
Some relief at the moment per pt. Pt is a little more relaxed.

## 2017-04-30 LAB — URINE CULTURE: SPECIAL REQUESTS: NORMAL

## 2018-05-13 IMAGING — CT CT ABD-PELV W/ CM
2 of 4 series · 16 of 46 positions shown, 18 images · IV contrast (iopamidol)
Comparison: 03/27/2017, 01/14/2004

CLINICAL DATA: Pain to the right groin for 2 weeks

EXAM:
CT ABDOMEN AND PELVIS WITH CONTRAST
TECHNIQUE: Multidetector CT imaging of the abdomen and pelvis was performed
using the standard protocol following bolus administration of
intravenous contrast.
CONTRAST:  100mL [id number] IOPAMIDOL ([id number]) INJECTION 61%

[Series 2: axial st · axial · 0.85mm/px · z∈[+1119,+1574]mm · 13 of 101 slices shown, 15 images]
[im 5/101  soft-tissue]
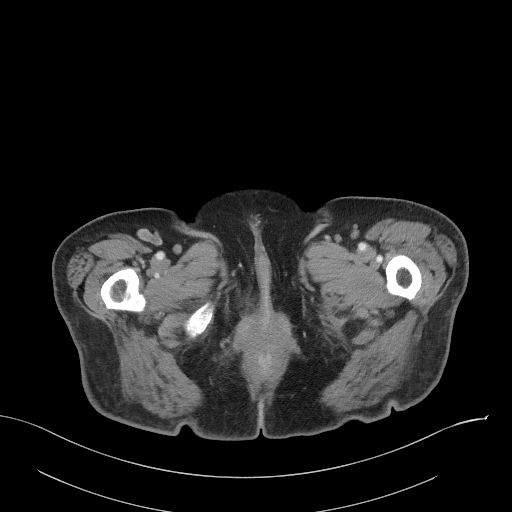
[im 5/101  bone]
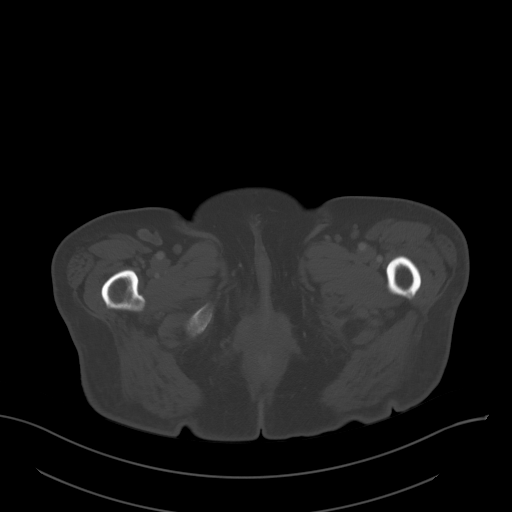
[im 14/101  soft-tissue]
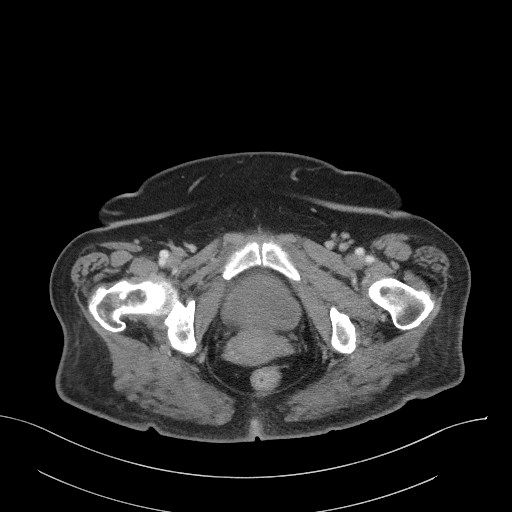
[im 23/101  soft-tissue]
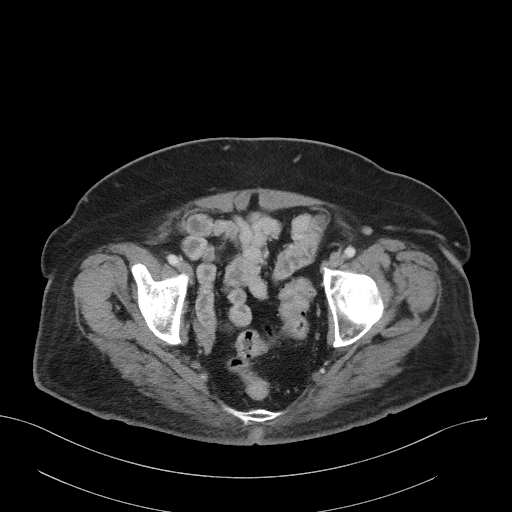
[im 28/101  soft-tissue]
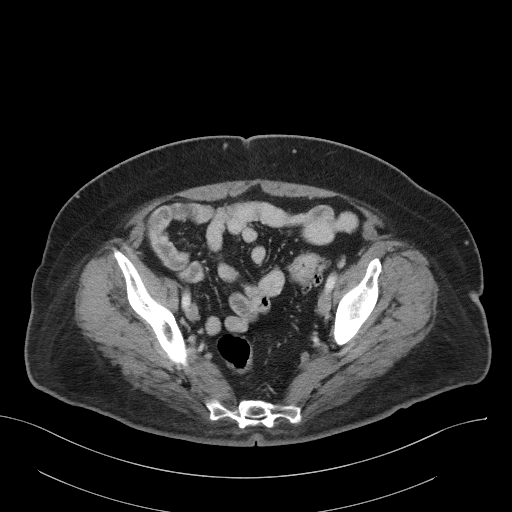
[im 37/101  soft-tissue]
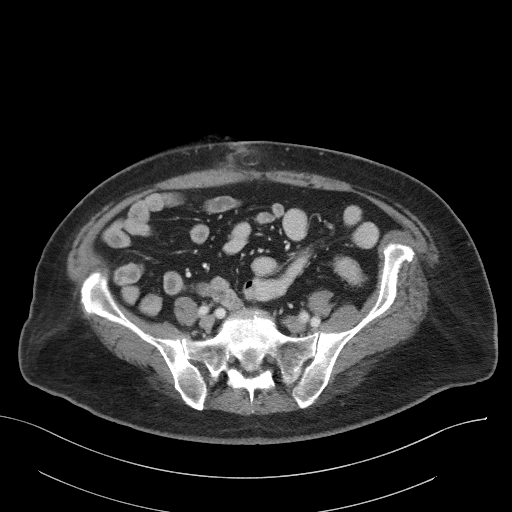
[im 41/101  soft-tissue]
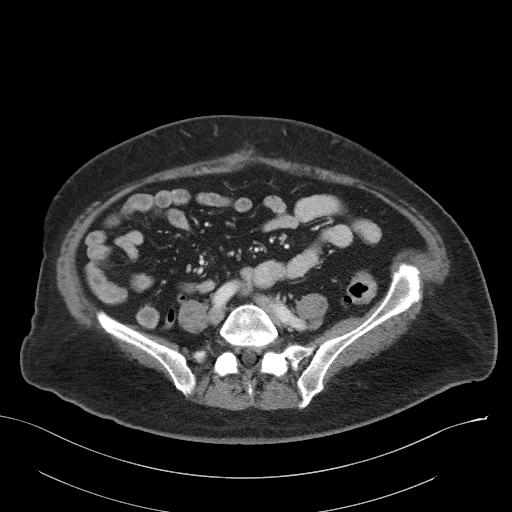
[im 51/101  soft-tissue]
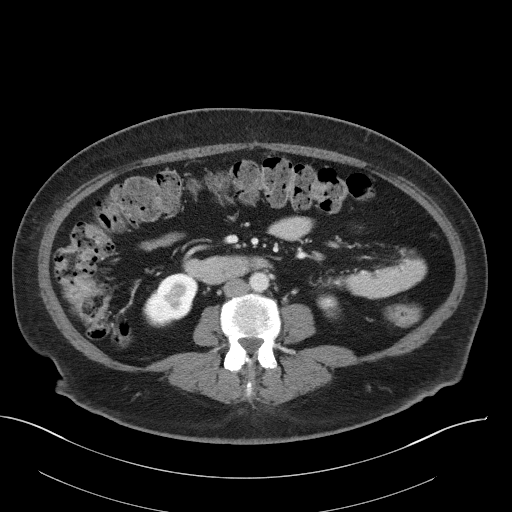
[im 60/101  soft-tissue]
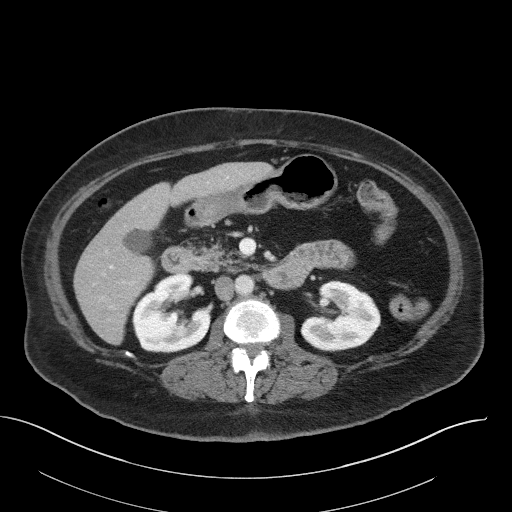
[im 64/101  soft-tissue]
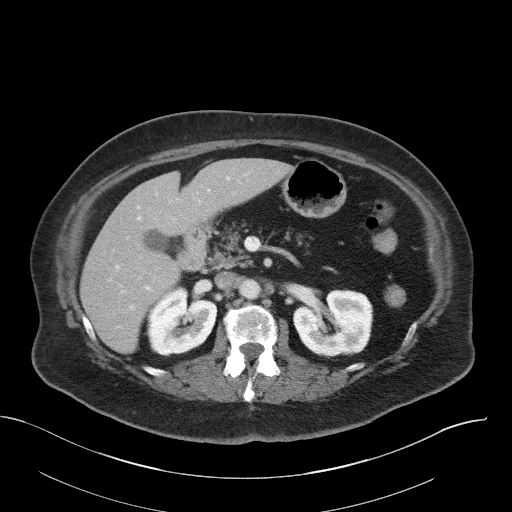
[im 64/101  bone]
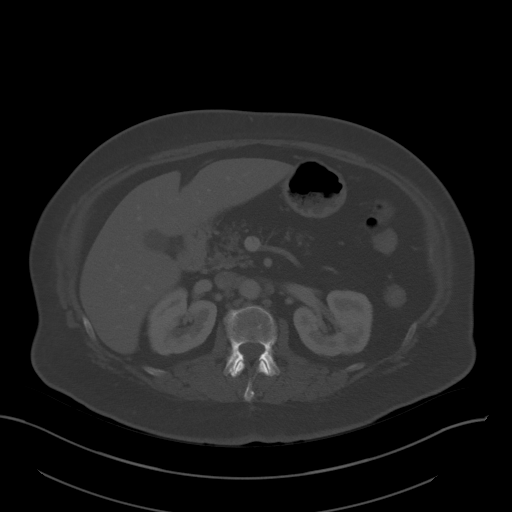
[im 73/101  soft-tissue]
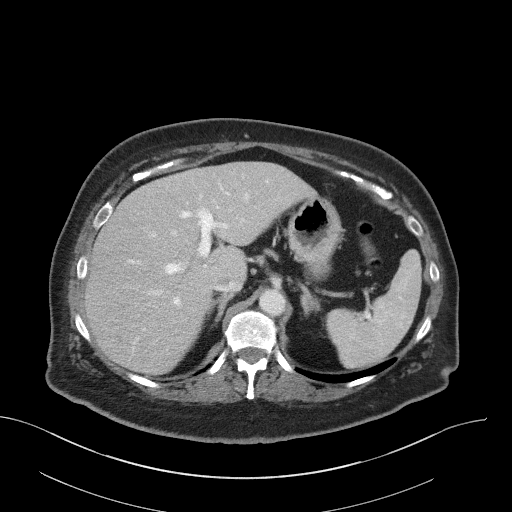
[im 78/101  soft-tissue]
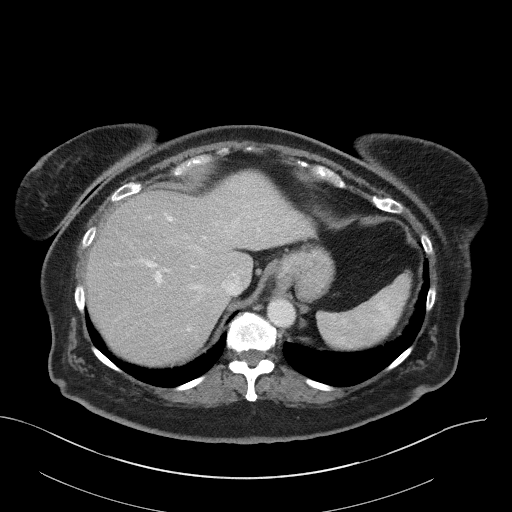
[im 87/101  soft-tissue]
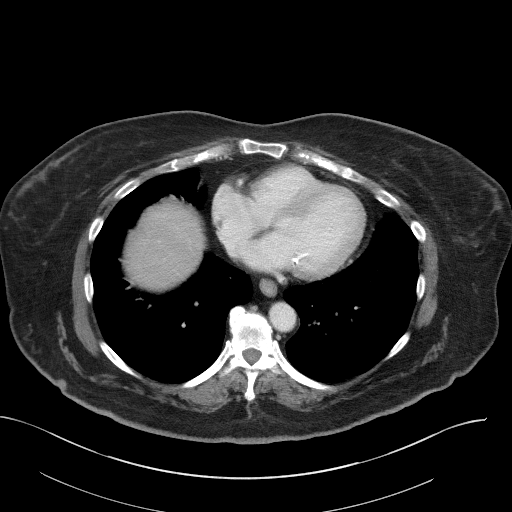
[im 96/101  soft-tissue]
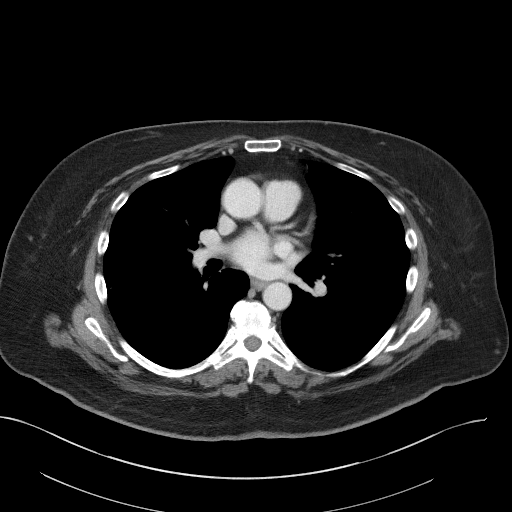

[Series 5: coronal st · coronal · 0.88mm/px · 3 of 108 slices shown]
[im 36/108  soft-tissue]
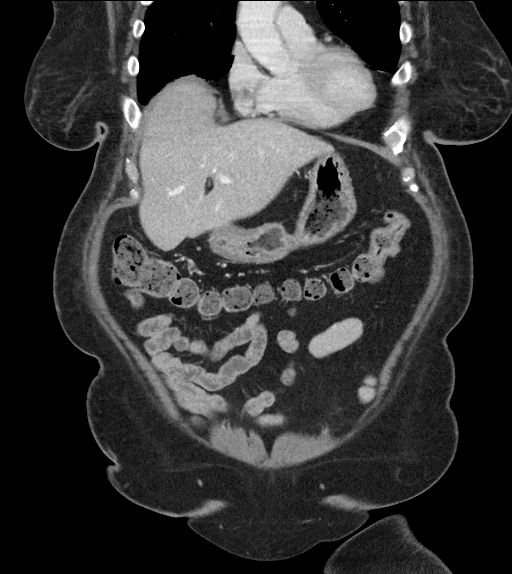
[im 48/108  soft-tissue]
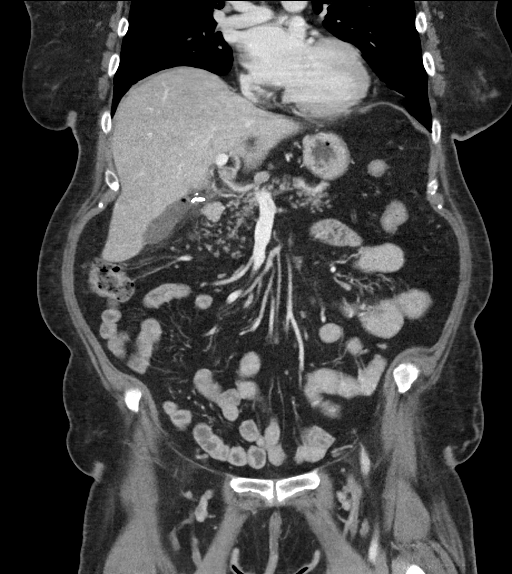
[im 60/108  soft-tissue]
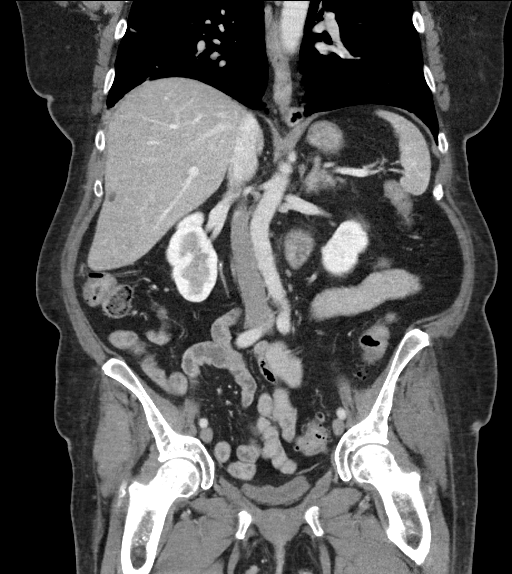

[16 of 46 positions shown; findings below may reference images not displayed]

FINDINGS: Lower chest: Stable small lung base nodules. No acute consolidation
or pleural effusion. Heart size within normal limits.

Hepatobiliary: Stable hypodense lesion in the anterior dome of the
liver. Additional subcentimeter hypodense liver lesions too small to
further characterize the grossly unchanged. Interval surgical clips
at the gallbladder fossa. Oblong shaped fluid collection with tiny
focus of gas at the gallbladder fossa measuring 4.6 cm longitudinal
by 2 cm thick. No significant biliary dilatation

Pancreas: Unremarkable. No pancreatic ductal dilatation or
surrounding inflammatory changes.

Spleen: Normal in size without focal abnormality.

Adrenals/Urinary Tract: Right adrenal gland is normal. Re-
demonstrated 13 mm left adrenal nodule. Kidneys show no
hydronephrosis. 3 mm stone in the proximal right ureter, just past
the UPJ. Bladder nearly empty

Stomach/Bowel: Stomach is within normal limits. Appendix appears
normal. No evidence of bowel wall thickening, distention, or
inflammatory changes. Sigmoid colon diverticular disease without
acute inflammation

Vascular/Lymphatic: No significant vascular findings are present. No
enlarged abdominal or pelvic lymph nodes.

Reproductive: Uterus and bilateral adnexa are unremarkable.

Other: Negative for free air or free fluid. Soft tissue thickening
in the umbilical region with small hernia.

Musculoskeletal: Degenerative changes. No acute or suspicious lesion
IMPRESSION: 1. 3 mm stone in the proximal right ureter just past the UPJ. No
significant hydronephrosis.
2. Stable bilateral pulmonary lung base nodules, felt benign
3. 13 mm left adrenal gland nodule. This is new since 3990.
Nonemergent adrenal CT follow-up recommended.
4. Interval surgical clips at the gallbladder fossa. Ovoid fluid
collection with small focus of gas at the gallbladder fossa
presumably represents a post- operative collection.

## 2019-02-10 ENCOUNTER — Other Ambulatory Visit: Payer: Self-pay

## 2019-02-10 ENCOUNTER — Emergency Department (HOSPITAL_COMMUNITY)
Admission: EM | Admit: 2019-02-10 | Discharge: 2019-02-11 | Disposition: A | Discharge status: Home / Self Care | Payer: Self-pay | Attending: Emergency Medicine | Admitting: Emergency Medicine

## 2019-02-10 DIAGNOSIS — E119 Type 2 diabetes mellitus without complications: Secondary | ICD-10-CM | POA: Insufficient documentation

## 2019-02-10 DIAGNOSIS — E039 Hypothyroidism, unspecified: Secondary | ICD-10-CM | POA: Insufficient documentation

## 2019-02-10 DIAGNOSIS — R1032 Left lower quadrant pain: Secondary | ICD-10-CM | POA: Insufficient documentation

## 2019-02-10 DIAGNOSIS — F17210 Nicotine dependence, cigarettes, uncomplicated: Secondary | ICD-10-CM | POA: Insufficient documentation

## 2019-02-10 DIAGNOSIS — N390 Urinary tract infection, site not specified: Secondary | ICD-10-CM | POA: Insufficient documentation

## 2019-02-10 NOTE — ED Triage Notes (Signed)
Patient c/o feeling bad with palpitations and LLQ pain.

## 2019-02-11 ENCOUNTER — Emergency Department (HOSPITAL_COMMUNITY): Payer: Self-pay

## 2019-02-11 LAB — CBC
HCT: 48.4 % — ABNORMAL HIGH (ref 36.0–46.0)
Hemoglobin: 16.6 g/dL — ABNORMAL HIGH (ref 12.0–15.0)
MCH: 30.5 pg (ref 26.0–34.0)
MCHC: 34.3 g/dL (ref 30.0–36.0)
MCV: 89 fL (ref 80.0–100.0)
Platelets: 64 10*3/uL — ABNORMAL LOW (ref 150–400)
RBC: 5.44 MIL/uL — ABNORMAL HIGH (ref 3.87–5.11)
RDW: 12.2 % (ref 11.5–15.5)
WBC: 12.9 10*3/uL — ABNORMAL HIGH (ref 4.0–10.5)
nRBC: 0 % (ref 0.0–0.2)

## 2019-02-11 LAB — URINALYSIS, ROUTINE W REFLEX MICROSCOPIC
Bilirubin Urine: NEGATIVE
Glucose, UA: >=500 mg/dL — AB
Ketones, ur: NEGATIVE mg/dL
Nitrite: NEGATIVE
Protein, ur: NEGATIVE mg/dL
Specific Gravity, Urine: 1.035 — ABNORMAL HIGH (ref 1.005–1.030)
pH: 5 (ref 5.0–8.0)

## 2019-02-11 LAB — TROPONIN I (HIGH SENSITIVITY)
Troponin I (High Sensitivity): 5 ng/L (ref <18)
Troponin I (High Sensitivity): 7 ng/L (ref <18)

## 2019-02-11 LAB — BASIC METABOLIC PANEL
Anion gap: 10 (ref 5–15)
BUN: 9 mg/dL (ref 8–23)
CO2: 26 mmol/L (ref 22–32)
Calcium: 10.1 mg/dL (ref 8.9–10.3)
Chloride: 95 mmol/L — ABNORMAL LOW (ref 98–111)
Creatinine, Ser: 0.55 mg/dL (ref 0.44–1.00)
GFR calc Af Amer: >60 mL/min (ref >60)
GFR calc non Af Amer: >60 mL/min (ref >60)
Glucose, Bld: 495 mg/dL — ABNORMAL HIGH (ref 70–99)
Potassium: 4.2 mmol/L (ref 3.5–5.1)
Sodium: 131 mmol/L — ABNORMAL LOW (ref 135–145)

## 2019-02-11 MED ORDER — FENTANYL CITRATE (PF) 100 MCG/2ML IJ SOLN
50.0000 ug | Freq: Once | INTRAMUSCULAR | Status: AC
  Administered 2019-02-11: 04:00:00 50 ug via INTRAVENOUS
  Filled 2019-02-11: qty 2

## 2019-02-11 MED ORDER — CEPHALEXIN 250 MG PO CAPS
500.0000 mg | ORAL_CAPSULE | Freq: Once | ORAL | Status: AC
  Administered 2019-02-11: 08:00:00 500 mg via ORAL
  Filled 2019-02-11: qty 2

## 2019-02-11 MED ORDER — SODIUM CHLORIDE 0.9 % IV BOLUS
1000.0000 mL | Freq: Once | INTRAVENOUS | Status: AC
  Administered 2019-02-11: 1000 mL via INTRAVENOUS

## 2019-02-11 MED ORDER — IOHEXOL 300 MG/ML  SOLN
100.0000 mL | Freq: Once | INTRAMUSCULAR | Status: AC | PRN
  Administered 2019-02-11: 100 mL via INTRAVENOUS

## 2019-02-11 MED ORDER — CEPHALEXIN 500 MG PO CAPS: 500.0000 mg | ORAL_CAPSULE | Freq: Three times a day (TID) | ORAL | 0 refills | Status: AC

## 2019-02-11 NOTE — ED Notes (Signed)
Sent a urine culture with the urine specimen 

## 2019-02-11 NOTE — ED Notes (Signed)
Pt ambulated to the bathroom w/o any issues.  

## 2019-02-11 NOTE — ED Provider Notes (Signed)
[facility] EMERGENCY DEPARTMENT Provider Note  CSN: [id number] Arrival date & time: 02/10/19 2342  Chief Complaint(s) Multiple Complaints  HPI Maria Leblanc is a 63 y.o. female with a past medical history listed below including recurrent urinary tract infections, diverticulosis who presents to the emergency department with reported palpitations and left lower quadrant abdominal discomfort.  Abdominal discomfort was gradual onset.  she also reports that she "just do not feel well."  Patient endorsing associated nausea without emesis.  No diarrhea.  No chest pain or shortness of breath.  No fevers or chills.  Reports having a history of renal stones but states that this is not feel similar.  States this this may be related to urinary tract infection.  HPI  Past Medical History Past Medical History:  Diagnosis Date  . Ankle fracture 02/18/2016   left  . Borderline diabetes   . Complication of anesthesia    "fighting with anesthesia when waking up"  . Diverticulosis   . Headache   . History of kidney stones   . Hypothyroid    Patient Active Problem List   Diagnosis Date Noted  . Perirectal abscess   . Calculus of gallbladder with acute cholecystitis without obstruction   . Sepsis (HCC) 03/27/2017  . RUQ pain 03/27/2017  . Hyponatremia 03/27/2017  . Thrombocytopenia (HCC) 03/27/2017  . Diabetes mellitus type II, non insulin dependent (HCC) 03/27/2017  . Adrenal nodule (HCC) 03/27/2017   Home Medication(s) Prior to Admission medications   Medication Sig Start Date End Date Taking? Authorizing Provider  cephALEXin (KEFLEX) 500 MG capsule Take 1 capsule (500 mg total) by mouth 3 (three) times daily for 10 days. 02/11/19 02/21/19  Nira Conn, MD  metFORMIN (GLUCOPHAGE) 500 MG tablet Take 1 tablet (500 mg total) by mouth 2 (two) times daily with a meal. Patient not taking: Reported on 04/23/2017 03/30/17   Erick Blinks, MD  ondansetron (ZOFRAN  ODT) 4 MG disintegrating tablet Take 1 tablet (4 mg total) by mouth every 8 (eight) hours as needed for nausea or vomiting. Patient not taking: Reported on 02/11/2019 04/29/17   Burgess Amor, PA-C  oxyCODONE (OXY IR/ROXICODONE) 5 MG immediate release tablet Take 1 tablet (5 mg total) by mouth every 4 (four) hours as needed for severe pain or breakthrough pain. Patient not taking: Reported on 04/23/2017 03/29/17   Lucretia Roers, MD  oxyCODONE-acetaminophen (PERCOCET/ROXICET) 5-325 MG tablet Take 1 tablet by mouth every 4 (four) hours as needed. Patient not taking: Reported on 02/11/2019 04/29/17   Burgess Amor, PA-C  pantoprazole (PROTONIX) 40 MG tablet Take 1 tablet (40 mg total) by mouth daily. Patient not taking: Reported on 04/23/2017 04/15/17   Lucretia Roers, MD  Saccharomyces boulardii (PROBIOTIC) 250 MG CAPS Take 1 tablet by mouth 2 (two) times daily. Patient not taking: Reported on 04/23/2017 04/15/17   Lucretia Roers, MD  Past Surgical History Past Surgical History:  Procedure Laterality Date  . CHOLECYSTECTOMY N/A 03/29/2017   Procedure: LAPAROSCOPIC CHOLECYSTECTOMY;  Surgeon: Lucretia RoersBridges, Lindsay C, MD;  Location: [facility];  Service: General;  Laterality: N/A;  . CYST REMOVAL NECK     x3  . DIAGNOSTIC LAPAROSCOPY     as teenager  . INCISION AND DRAINAGE PERIRECTAL ABSCESS  03/29/2017   Procedure: INCISION  AND DRAINAGE PERIRECTAL ABSCESS;  Surgeon: Lucretia RoersBridges, Lindsay C, MD;  Location: [facility];  Service: General;;  . ORIF ANKLE FRACTURE Left 02/28/2016   Procedure: OPEN REDUCTION INTERNAL FIXATION (ORIF) ANKLE FRACTURE, LEFT;  Surgeon: Sheral Apleyimothy D Murphy, MD;  Location: [facility];  Service: Orthopedics;  Laterality: Left;  . TONSILLECTOMY     as child   Family History Family History  Problem Relation Age of Onset  . Diabetes Mother   .  Other Mother   . Dementia Mother   . Cancer Father   . Heart attack Father   . Diabetes Father   . Other Brother     Social History Social History   Tobacco Use  . Smoking status: Current Every Day Smoker    Packs/day: 0.50    Years: 20.00    Pack years: 10.00    Types: Cigarettes  . Smokeless tobacco: Never Used  Substance Use Topics  . Alcohol use: Yes    Comment: rare use of liquor 3 times yearly   . Drug use: No   Allergies Anesthesia s-i-60  Review of Systems Review of Systems All other systems are reviewed and are negative for acute change except as noted in the HPI  Physical Exam Vital Signs  I have reviewed the triage vital signs BP 140/87   Pulse 78   Temp 98.1 F (36.7 C) (Oral)   Resp 14   Ht 5' 5.5" (1.664 m)   Wt 74.8 kg   SpO2 95%   BMI 27.04 kg/m   Physical Exam Vitals signs reviewed.  Constitutional:      General: She is not in acute distress.    Appearance: She is well-developed. She is not diaphoretic.  HENT:     Head: Normocephalic and atraumatic.     Right Ear: External ear normal.     Left Ear: External ear normal.     Nose: Nose normal.  Eyes:     General: No scleral icterus.    Conjunctiva/sclera: Conjunctivae normal.  Neck:     Musculoskeletal: Normal range of motion.     Trachea: Phonation normal.  Cardiovascular:     Rate and Rhythm: Normal rate and regular rhythm.  Pulmonary:     Effort: Pulmonary effort is normal. No respiratory distress.     Breath sounds: No stridor.  Abdominal:     General: There is no distension.     Tenderness: There is abdominal tenderness in the left lower quadrant. There is no guarding or rebound.  Musculoskeletal: Normal range of motion.  Neurological:     Mental Status: She is alert and oriented to person, place, and time.  Psychiatric:        Behavior: Behavior normal.     ED Results and Treatments Labs (all labs ordered are listed, but only abnormal results are displayed) Labs  Reviewed  BASIC METABOLIC PANEL - Abnormal; Notable for the following components:      Result Value   Sodium 131 (*)    Chloride 95 (*)    Glucose, Bld 495 (*)  All other components within normal limits  CBC - Abnormal; Notable for the following components:   WBC 12.9 (*)    RBC 5.44 (*)    Hemoglobin 16.6 (*)    HCT 48.4 (*)    Platelets 64 (*)    All other components within normal limits  URINALYSIS, ROUTINE W REFLEX MICROSCOPIC - Abnormal; Notable for the following components:   Specific Gravity, Urine 1.035 (*)    Glucose, UA >=500 (*)    Hgb urine dipstick SMALL (*)    Leukocytes,Ua MODERATE (*)    Bacteria, UA RARE (*)    All other components within normal limits  TROPONIN I (HIGH SENSITIVITY)  TROPONIN I (HIGH SENSITIVITY)                                                                                                                         EKG  EKG Interpretation  Date/Time:  Friday February 10 2019 23:57:03 EDT Ventricular Rate:  96 PR Interval:  182 QRS Duration: 86 QT Interval:  364 QTC Calculation: 459 R Axis:   84 Text Interpretation:  Sinus rhythm with Premature atrial complexes No significant change since last tracing Confirmed by Addison Lank [phone]) on 02/11/2019 2:43:10 AM      Radiology Dg Chest 2 View  Result Date: 02/11/2019 CLINICAL DATA:  Palpitations EXAM: CHEST - 2 VIEW COMPARISON:  March 27, 2017 FINDINGS: The heart size and mediastinal contours are within normal limits. Both lungs are clear. No acute osseous abnormality. IMPRESSION: No active cardiopulmonary disease. Electronically Signed   By: Prudencio Pair M.D.   On: 02/11/2019 00:34   Ct Abdomen Pelvis W Contrast  Result Date: 02/11/2019 CLINICAL DATA:  Generalized abdominal pain and nausea. History of diverticulosis. EXAM: CT ABDOMEN AND PELVIS WITH CONTRAST TECHNIQUE: Multidetector CT imaging of the abdomen and pelvis was performed using the standard protocol following bolus  administration of intravenous contrast. CONTRAST:  175mL OMNIPAQUE IOHEXOL 300 MG/ML  SOLN COMPARISON:  None. FINDINGS: Lower chest: Limited visualization of the lower thorax demonstrates a punctate (approximately 0.4 cm) nodule within the subpleural aspect the right lower lobe (image 8, series 5) as well as a punctate (approximately 3 mm) nodule within the left costophrenic angle (image 16), both of which are unchanged compared to the 03/27/2017 examination with this examination documents nearly 2 years of stability. Minimal bibasilar subsegmental atelectasis. No pleural effusion. Normal heart size. No pericardial effusion. Calcifications about the mitral valve annulus. Hepatobiliary: There is mild diffuse decreased attenuation hepatic parenchyma on this postcontrast examination. Scattered hypoattenuating hepatic lesions are too small to accurately characterize though morphologically similar to the 03/2017 examination and favored to represent hepatic cysts. No definitive discrete new hepatic lesions. Post cholecystectomy. There is unchanged mild dilatation of the CBD measuring 8 mm in diameter presumably the sequela biliary reservoir phenomena due to post cholecystectomy state. No intrahepatic biliary ductal dilatation. No ascites. Pancreas: The pancreas is largely fatty replaced, unchanged. Spleen: Normal appearance of the spleen.  Adrenals/Urinary Tract: There is symmetric enhancement and excretion of the bilateral kidneys. No definite renal stones on this postcontrast examination. No discrete renal lesions. No urinary obstruction or perinephric stranding. The approximately 1.4 cm nodule within the left adrenal gland (image 25, series 3; image 4, series 8), is unchanged compared to the 03/2017 examination and demonstrates washout characteristics compatible with a benign adrenal adenoma. Normal appearance of the right adrenal gland. Normal appearance of the urinary bladder given degree of distention. Stomach/Bowel:  Moderate colonic stool burden without evidence of enteric obstruction. Rather extensive diverticulosis involving primarily the descending colon without definitive evidence of superimposed acute diverticulitis. Potential mild circumferential wall thickening involving the jejunum primarily within the left mid hemiabdomen (representative images 39 and 53, series 3). No associated mesenteric stranding. Normal appearance of the terminal ileum and the retrocecal appendix. Small hiatal hernia, unchanged. No pneumoperitoneum, pneumatosis or portal venous gas. Vascular/Lymphatic: Normal caliber the abdominal aorta. The major branch vessels of the abdominal aorta appear patent on this non CTA examination. No bulky retroperitoneal, mesenteric, pelvic or inguinal lymphadenopathy. Reproductive: Normal appearance of the pelvic organs for age. No free fluid within the pelvic cul-de-sac. Other: Regional soft tissues appear normal. Musculoskeletal: No acute or aggressive osseous abnormalities. Bilateral facet degenerative change of the lumbar spine. Stigmata of DISH within the thoracic spine. IMPRESSION: 1. Potential mild circumferential wall thickening involving several loops jejunum centered within the left mid hemiabdomen, potentially artifactual due to underdistention though could be seen in a localized enteritis. Clinical correlation is advised. 2. Otherwise, no definite explanation for patient's generalized abdominal pain nausea. 3. Diverticulosis without evidence of superimposed acute diverticulitis. 4. Normal appearance of the appendix. 5. Small hiatal hernia. 6. Post cholecystectomy. 7. Incidentally noted 1.4 cm left adrenal adenoma, unchanged compared to the 03/2017 examination. Electronically Signed   By: Simonne Come M.D.   On: 02/11/2019 05:37    Pertinent labs & imaging results that were available during my care of the patient were reviewed by me and considered in my medical decision making (see chart for details).   Medications Ordered in ED Medications  cephALEXin (KEFLEX) capsule 500 mg (has no administration in time range)  fentaNYL (SUBLIMAZE) injection 50 mcg (50 mcg Intravenous Given 02/11/19 0419)  iohexol (OMNIPAQUE) 300 MG/ML solution 100 mL (100 mLs Intravenous Contrast Given 02/11/19 0503)  sodium chloride 0.9 % bolus 1,000 mL (1,000 mLs Intravenous New Bag/Given 02/11/19 0612)                                                                                                                                    Procedures Procedures  (including critical care time)  Medical Decision Making / ED Course I have reviewed the nursing notes for this encounter and the patient's prior records (if available in EHR or on provided paperwork).   Maria Leblanc was evaluated in Emergency Department on 02/11/2019 for the symptoms described in the history  of present illness. She was evaluated in the context of the global COVID-19 pandemic, which necessitated consideration that the patient might be at risk for infection with the SARS-CoV-2 virus that causes COVID-19. Institutional protocols and algorithms that pertain to the evaluation of patients at risk for COVID-19 are in a state of rapid change based on information released by regulatory bodies including the CDC and federal and state organizations. These policies and algorithms were followed during the patient's care in the ED.  Lower abdominal discomfort.  CBC with leukocytosis.  UA still pending.  Given history of diverticulosis.  Will obtain CT scan to rule out diverticulitis.  Scan negative for diverticulitis.  A reported possible enteritis however patient denies any gastroenteritis symptoms.  Patient does have evidence of urinary tract infection.  Able to tolerate oral intake given first dose of antibiotics in the emergency department.  Stable for outpatient management.  The patient appears reasonably screened and/or stabilized for discharge and I doubt  any other medical condition or other [facility] requiring further screening, evaluation, or treatment in the ED at this time prior to discharge.  The patient is safe for discharge with strict return precautions.       Final Clinical Impression(s) / ED Diagnoses Final diagnoses:  Lower urinary tract infection     The patient appears reasonably screened and/or stabilized for discharge and I doubt any other medical condition or other [facility] requiring further screening, evaluation, or treatment in the ED at this time prior to discharge.  Disposition: Discharge  Condition: Good  I have discussed the results, Dx and Tx plan with the patient who expressed understanding and agree(s) with the plan. Discharge instructions discussed at great length. The patient was given strict return precautions who verbalized understanding of the instructions. No further questions at time of discharge.    ED Discharge Orders         Ordered    cephALEXin (KEFLEX) 500 MG capsule  3 times daily     02/11/19 0725            Follow Up: Assunta Found, MD [address] [place] [address] [phone]  Schedule an appointment as soon as possible for a visit  As needed    This chart was dictated using voice recognition software.  Despite best efforts to proofread,  errors can occur which can change the documentation meaning.   Nira Conn, MD 02/11/19 [phone]

## 2019-02-11 NOTE — ED Notes (Signed)
Patient verbalizes understanding of discharge instructions. Opportunity for questioning and answers were provided. Armband removed by staff, pt discharged from ED.  

## 2019-02-12 ENCOUNTER — Encounter (HOSPITAL_COMMUNITY): Payer: Self-pay

## 2019-02-12 ENCOUNTER — Emergency Department (HOSPITAL_COMMUNITY)
Admission: EM | Admit: 2019-02-12 | Discharge: 2019-02-13 | Disposition: A | Discharge status: Home / Self Care | Payer: Self-pay | Attending: Emergency Medicine | Admitting: Emergency Medicine

## 2019-02-12 ENCOUNTER — Other Ambulatory Visit: Payer: Self-pay

## 2019-02-12 ENCOUNTER — Emergency Department (HOSPITAL_COMMUNITY): Payer: Self-pay

## 2019-02-12 DIAGNOSIS — E039 Hypothyroidism, unspecified: Secondary | ICD-10-CM | POA: Insufficient documentation

## 2019-02-12 DIAGNOSIS — Z79899 Other long term (current) drug therapy: Secondary | ICD-10-CM | POA: Insufficient documentation

## 2019-02-12 DIAGNOSIS — F17210 Nicotine dependence, cigarettes, uncomplicated: Secondary | ICD-10-CM | POA: Insufficient documentation

## 2019-02-12 DIAGNOSIS — I10 Essential (primary) hypertension: Secondary | ICD-10-CM

## 2019-02-12 DIAGNOSIS — Z7984 Long term (current) use of oral hypoglycemic drugs: Secondary | ICD-10-CM | POA: Insufficient documentation

## 2019-02-12 DIAGNOSIS — R1032 Left lower quadrant pain: Secondary | ICD-10-CM | POA: Insufficient documentation

## 2019-02-12 DIAGNOSIS — R739 Hyperglycemia, unspecified: Secondary | ICD-10-CM

## 2019-02-12 DIAGNOSIS — R0789 Other chest pain: Secondary | ICD-10-CM | POA: Insufficient documentation

## 2019-02-12 NOTE — ED Notes (Signed)
Attempted to obtain IV access - unsuccessful. Lab at bedside for blood draw at this time.

## 2019-02-12 NOTE — ED Triage Notes (Signed)
Pt presents to ED with complaints of left sided chest pain started Friday. Pt denies N/V, dizziness. Pt states she was seen on 10/16 for the same thing.

## 2019-02-12 NOTE — ED Provider Notes (Signed)
[facility] EMERGENCY DEPARTMENT Provider Note   CSN: [id number] Arrival date & time: 02/12/19  2048     History   Chief Complaint Chief Complaint  Patient presents with  . Chest Pain    HPI Maria Leblanc is a 63 y.o. female.     Patient presents to the emergency department for evaluation of chest pain.  Patient was seen in the emergency department at [facility] yesterday for left-sided chest pain and left lower abdominal pain.  She reports that she originally presented to the emergency department yesterday for the chest pain.  Sometime during the work-up she developed abdominal pain as well.  Patient reports that since she left the emergency department yesterday she has been having sharp stabbing pains in the area of her left breast and under the left arm.  No associated shortness of breath.  She has not identified anything that causes the pain or makes it better.  She has noticed that her blood pressure is staying high.  She was previously on Metformin as well as Synthroid and an unknown blood pressure medication but has been off of all of her medications for "a long time".     Past Medical History:  Diagnosis Date  . Ankle fracture 02/18/2016   left  . Borderline diabetes   . Complication of anesthesia    "fighting with anesthesia when waking up"  . Diverticulosis   . Headache   . History of kidney stones   . Hypothyroid     Patient Active Problem List   Diagnosis Date Noted  . Perirectal abscess   . Calculus of gallbladder with acute cholecystitis without obstruction   . Sepsis ([place]) 03/27/2017  . RUQ pain 03/27/2017  . Hyponatremia 03/27/2017  . Thrombocytopenia ([place]) 03/27/2017  . Diabetes mellitus type II, non insulin dependent ([place]) 03/27/2017  . Adrenal nodule ([place]) 03/27/2017    Past Surgical History:  Procedure Laterality Date  . CHOLECYSTECTOMY N/A 03/29/2017   Procedure: LAPAROSCOPIC CHOLECYSTECTOMY;  Surgeon: Virl Cagey, MD;  Location: [facility];  Service: General;  Laterality: N/A;  . CYST REMOVAL NECK     x3  . DIAGNOSTIC LAPAROSCOPY     as teenager  . INCISION AND DRAINAGE PERIRECTAL ABSCESS  03/29/2017   Procedure: INCISION  AND DRAINAGE PERIRECTAL ABSCESS;  Surgeon: Virl Cagey, MD;  Location: [facility];  Service: General;;  . ORIF ANKLE FRACTURE Left 02/28/2016   Procedure: OPEN REDUCTION INTERNAL FIXATION (ORIF) ANKLE FRACTURE, LEFT;  Surgeon: Renette Butters, MD;  Location: [facility];  Service: Orthopedics;  Laterality: Left;  . TONSILLECTOMY     as child     OB History   No obstetric history on file.      Home Medications    Prior to Admission medications   Medication Sig Start Date End Date Taking? Authorizing Provider  cephALEXin (KEFLEX) 500 MG capsule Take 1 capsule (500 mg total) by mouth 3 (three) times daily for 10 days. 02/11/19 02/21/19 Yes Cardama, Grayce Sessions, MD  lisinopril (ZESTRIL) 10 MG tablet Take 1 tablet (10 mg total) by mouth daily. 02/13/19   Orpah Greek, MD  meloxicam (MOBIC) 7.5 MG tablet Take 1 tablet (7.5 mg total) by mouth daily. For chest wall pain 02/13/19   Orpah Greek, MD  metFORMIN (GLUCOPHAGE) 500 MG tablet Take 1 tablet (500 mg total) by mouth 2 (two) times daily with a meal. 02/13/19   Pollina, Gwenyth Allegra, MD  pantoprazole ([place]) 40  MG tablet Take 1 tablet (40 mg total) by mouth daily. Patient not taking: Reported on 04/23/2017 04/15/17   Lucretia RoersBridges, Lindsay C, MD    Family History Family History  Problem Relation Age of Onset  . Diabetes Mother   . Other Mother   . Dementia Mother   . Cancer Father   . Heart attack Father   . Diabetes Father   . Other Brother     Social History Social History   Tobacco Use  . Smoking status: Current Every Day Smoker    Packs/day: 0.50    Years: 20.00    Pack years: 10.00    Types: Cigarettes  . Smokeless tobacco: Never Used  Substance Use Topics  . Alcohol use: Yes    Comment:  rare use of liquor 3 times yearly   . Drug use: No     Allergies   Anesthesia s-i-60   Review of Systems Review of Systems  Cardiovascular: Positive for chest pain and palpitations.  All other systems reviewed and are negative.    Physical Exam Updated Vital Signs BP (!) 183/98   Pulse 69   Temp 98.2 F (36.8 C) (Oral)   Resp 17   Ht 5' 5.5" (1.664 m)   Wt 74.8 kg   SpO2 95%   BMI 27.04 kg/m   Physical Exam Vitals signs and nursing note reviewed.  Constitutional:      General: She is not in acute distress.    Appearance: Normal appearance. She is well-developed.  HENT:     Head: Normocephalic and atraumatic.     Right Ear: Hearing normal.     Left Ear: Hearing normal.     Nose: Nose normal.  Eyes:     Conjunctiva/sclera: Conjunctivae normal.     Pupils: Pupils are equal, round, and reactive to light.  Neck:     Musculoskeletal: Normal range of motion and neck supple.  Cardiovascular:     Rate and Rhythm: Regular rhythm.     Heart sounds: S1 normal and S2 normal. No murmur. No friction rub. No gallop.   Pulmonary:     Effort: Pulmonary effort is normal. No respiratory distress.     Breath sounds: Normal breath sounds.  Chest:     Chest wall: Tenderness present. No crepitus.    Abdominal:     General: Bowel sounds are normal.     Palpations: Abdomen is soft.     Tenderness: There is no abdominal tenderness. There is no guarding or rebound. Negative signs include Murphy's sign and McBurney's sign.     Hernia: No hernia is present.  Musculoskeletal: Normal range of motion.  Skin:    General: Skin is warm and dry.     Findings: No rash.  Neurological:     Mental Status: She is alert and oriented to person, place, and time.     GCS: GCS eye subscore is 4. GCS verbal subscore is 5. GCS motor subscore is 6.     Cranial Nerves: No cranial nerve deficit.     Sensory: No sensory deficit.     Coordination: Coordination normal.  Psychiatric:        Speech:  Speech normal.        Behavior: Behavior normal.        Thought Content: Thought content normal.      ED Treatments / Results  Labs (all labs ordered are listed, but only abnormal results are displayed) Labs Reviewed  BASIC METABOLIC PANEL - Abnormal;  Notable for the following components:      Result Value   Glucose, Bld 357 (*)    Creatinine, Ser 0.41 (*)    All other components within normal limits  CBC - Abnormal; Notable for the following components:   Hemoglobin 15.3 (*)    Platelets 108 (*)    All other components within normal limits  TROPONIN I (HIGH SENSITIVITY)    EKG None  Radiology Dg Chest 2 View  Result Date: 02/12/2019 CLINICAL DATA:  63 year old female with chest pain. EXAM: CHEST - 2 VIEW COMPARISON:  Chest radiograph dated 02/11/2019 FINDINGS: Shallow inspiration with bibasilar atelectasis. No focal consolidation, pleural effusion, or pneumothorax. The cardiac silhouette is within normal limits. No acute osseous pathology. IMPRESSION: No active cardiopulmonary disease. Electronically Signed   By: Elgie Collard M.D.   On: 02/12/2019 21:43    Procedures Procedures (including critical care time)  Medications Ordered in ED Medications  insulin aspart (novoLOG) injection 12 Units (12 Units Subcutaneous Given 02/13/19 0118)     Initial Impression / Assessment and Plan / ED Course  I have reviewed the triage vital signs and the nursing notes.  Pertinent labs & imaging results that were available during my care of the patient were reviewed by me and considered in my medical decision making (see chart for details).        Patient returns for continued left-sided chest pain.  Patient indicates the area of the lateral left breast as well as mid axillary line on the left side as the area of pain.  When I palpate this area the patient jumps as it significantly worsens her pain.  This appears to be chest wall pain in nature.  She had a cardiac evaluation  yesterday including EKG and 2 troponins that were normal.  EKG unchanged today, additional troponin negative.  Does not require further work-up, treat for chest wall pain.  Restart her Glucophage, initiate lisinopril and patient instructed to follow-up with primary care.  Final Clinical Impressions(s) / ED Diagnoses   Final diagnoses:  Chest wall pain  Essential hypertension  Hyperglycemia    ED Discharge Orders         Ordered    metFORMIN (GLUCOPHAGE) 500 MG tablet  2 times daily with meals     02/13/19 0108    lisinopril (ZESTRIL) 10 MG tablet  Daily     02/13/19 0108    meloxicam (MOBIC) 7.5 MG tablet  Daily     02/13/19 0108           Gilda Crease, MD 02/13/19 0725

## 2019-02-13 LAB — CBC
HCT: 45.6 % (ref 36.0–46.0)
Hemoglobin: 15.3 g/dL — ABNORMAL HIGH (ref 12.0–15.0)
MCH: 30.3 pg (ref 26.0–34.0)
MCHC: 33.6 g/dL (ref 30.0–36.0)
MCV: 90.3 fL (ref 80.0–100.0)
Platelets: 108 10*3/uL — ABNORMAL LOW (ref 150–400)
RBC: 5.05 MIL/uL (ref 3.87–5.11)
RDW: 12.4 % (ref 11.5–15.5)
WBC: 10.5 10*3/uL (ref 4.0–10.5)
nRBC: 0 % (ref 0.0–0.2)

## 2019-02-13 LAB — BASIC METABOLIC PANEL
Anion gap: 8 (ref 5–15)
BUN: 9 mg/dL (ref 8–23)
CO2: 26 mmol/L (ref 22–32)
Calcium: 9.6 mg/dL (ref 8.9–10.3)
Chloride: 102 mmol/L (ref 98–111)
Creatinine, Ser: 0.41 mg/dL — ABNORMAL LOW (ref 0.44–1.00)
GFR calc Af Amer: >60 mL/min (ref >60)
GFR calc non Af Amer: >60 mL/min (ref >60)
Glucose, Bld: 357 mg/dL — ABNORMAL HIGH (ref 70–99)
Potassium: 3.5 mmol/L (ref 3.5–5.1)
Sodium: 136 mmol/L (ref 135–145)

## 2019-02-13 LAB — TROPONIN I (HIGH SENSITIVITY): Troponin I (High Sensitivity): 4 ng/L (ref <18)

## 2019-02-13 MED ORDER — MELOXICAM 7.5 MG PO TABS: 7.5000 mg | ORAL_TABLET | Freq: Every day | ORAL | 0 refills | Status: DC

## 2019-02-13 MED ORDER — LISINOPRIL 10 MG PO TABS: 10.0000 mg | ORAL_TABLET | Freq: Every day | ORAL | 2 refills | Status: DC

## 2019-02-13 MED ORDER — METFORMIN HCL 500 MG PO TABS: 500.0000 mg | ORAL_TABLET | Freq: Two times a day (BID) | ORAL | 2 refills | Status: DC

## 2019-02-13 MED ORDER — INSULIN ASPART 100 UNIT/ML ~~LOC~~ SOLN
12.0000 [IU] | Freq: Once | SUBCUTANEOUS | Status: AC
  Administered 2019-02-13: 01:00:00 12 [IU] via SUBCUTANEOUS
  Filled 2019-02-13: qty 1

## 2019-02-13 NOTE — Discharge Instructions (Addendum)
Please find a primary care doctor to have your blood pressure and blood sugar rechecked

## 2019-07-04 ENCOUNTER — Encounter: Payer: Self-pay | Admitting: Internal Medicine

## 2019-07-19 ENCOUNTER — Ambulatory Visit (INDEPENDENT_AMBULATORY_CARE_PROVIDER_SITE_OTHER): Payer: 59 | Admitting: Gastroenterology

## 2019-07-19 ENCOUNTER — Other Ambulatory Visit: Payer: Self-pay

## 2019-07-19 ENCOUNTER — Telehealth: Payer: Self-pay

## 2019-07-19 ENCOUNTER — Encounter: Payer: Self-pay | Admitting: *Deleted

## 2019-07-19 ENCOUNTER — Encounter: Payer: Self-pay | Admitting: Gastroenterology

## 2019-07-19 DIAGNOSIS — R197 Diarrhea, unspecified: Secondary | ICD-10-CM | POA: Insufficient documentation

## 2019-07-19 DIAGNOSIS — R112 Nausea with vomiting, unspecified: Secondary | ICD-10-CM | POA: Diagnosis not present

## 2019-07-19 DIAGNOSIS — R1032 Left lower quadrant pain: Secondary | ICD-10-CM | POA: Diagnosis not present

## 2019-07-19 MED ORDER — CLENPIQ 10-3.5-12 MG-GM -GM/160ML PO SOLN: 1.0000 | Freq: Once | ORAL | 0 refills | Status: AC

## 2019-07-19 MED ORDER — NA SULFATE-K SULFATE-MG SULF 17.5-3.13-1.6 GM/177ML PO SOLN: 1.0000 | Freq: Once | ORAL | 0 refills | Status: AC

## 2019-07-19 MED ORDER — DICYCLOMINE HCL 10 MG PO CAPS: ORAL_CAPSULE | ORAL | 0 refills | Status: DC

## 2019-07-19 NOTE — Telephone Encounter (Signed)
Received fax from pharmacy, Suprep not covered by insurance. Clenpiq is preferred. Sent rx for Clenpiq to pharmacy. New instructions printed to be mailed with pre-op and COVID appt letter.

## 2019-07-19 NOTE — Patient Instructions (Addendum)
1. Colonoscopy as scheduled. See separate instructions.  2. Trial of dicyclomine 10mg  up to four times daily for abdominal pain, loose stools. Take before a meal and at bedtime as needed. Hold if you get constipated.  3. Call if your abdominal pain persists.  4. Please discuss your elevated blood pressure with your PCP this week. If you have headache, dizziness, vision changes you need to go to ER.

## 2019-07-19 NOTE — Progress Notes (Signed)
Primary Care Physician:  Arsenio Katz, NP  Primary Gastroenterologist:  Garfield Cornea, MD   Chief Complaint  Patient presents with  . Abdominal Pain    llq    HPI:  Maria Leblanc is a 64 y.o. female here for further evaluation of left lower quadrant pain.  Patient was seen in the ED a couple of times at [facility].  CT abdomen pelvis with contrast dated May 30, 2019 showed small hiatal hernia, diffuse fatty atrophy of the pancreas.  1.5 cm left adrenal gland lesion, likely adenoma, stable.  Scattered colonic diverticula with no diverticulosis.  Appendix normal.  Stable too small to characterize right liver lesions felt to be cyst.  Patient had CT abdomen pelvis with contrast October 2020: Potential mild circumferential wall thickening involving several loops of jejunum centered within the left mid hemiabdomen, potentially artifactual due to underdistention though could be seen with localized enteritis.  Diverticulosis without evidence of diverticulitis.  Incidentally noted 1.4 cm left adrenal adenoma unchanged from December 2018 study compatible with a benign adrenal adenoma.  Moderate colonic stool burden but no obstruction.  Blood pressure has been elevated.  When she saw her PCP on February 19 her blood pressure was 172/102, 170/92 on March 4.  Two months of LLQ pain. Associated with N/V. PCP gave her Cipro/Flagyl for possible low-grade diverticulitis. Given after her CT scan in ED. Given nausea medicine and finally n/v resolved. At baseline, BM every couple of days. Diarrhea for a few weeks, wakes up to have BM. Patient works third shift as Animal nutritionist. All stools are runny, several episodes after every meal. She refrains from eating too much due to diarrhea. Diarrhea seemed to start after increasing metformin few weeks ago.   Her abdominal pain had finally subsided over the weekend but she has had some pain this morning in LLQ. No heartburn issues.   No prior colonosocpy.  No FH. No IBD, celiac.   She states she fought anesthesia after receiving propofol before but did fine after her gb surgery.   Current Outpatient Medications  Medication Sig Dispense Refill  . acetaminophen (TYLENOL) 325 MG tablet Take 650 mg by mouth every 6 (six) hours as needed.    Marland Kitchen ibuprofen (ADVIL) 200 MG tablet Take 200 mg by mouth every 6 (six) hours as needed.    . metFORMIN (GLUCOPHAGE) 1000 MG tablet Take 1,000 mg by mouth 2 (two) times daily.     No current facility-administered medications for this visit.    Allergies as of 07/19/2019 - Review Complete 07/19/2019  Allergen Reaction Noted  . Propofol Other (See Comments) 03/28/2017    Past Medical History:  Diagnosis Date  . Ankle fracture 02/18/2016   left  . Borderline diabetes   . Complication of anesthesia    "fighting with anesthesia when waking up"  . Diverticulosis   . Headache   . History of kidney stones   . Hypothyroid     Past Surgical History:  Procedure Laterality Date  . CHOLECYSTECTOMY N/A 03/29/2017   Procedure: LAPAROSCOPIC CHOLECYSTECTOMY;  Surgeon: Virl Cagey, MD;  Location: [facility];  Service: General;  Laterality: N/A;  . CYST REMOVAL NECK     x3  . DIAGNOSTIC LAPAROSCOPY     as teenager  . INCISION AND DRAINAGE PERIRECTAL ABSCESS  03/29/2017   Procedure: INCISION  AND DRAINAGE PERIRECTAL ABSCESS;  Surgeon: Virl Cagey, MD;  Location: [facility];  Service: General;;  . ORIF ANKLE FRACTURE Left 02/28/2016  Procedure: OPEN REDUCTION INTERNAL FIXATION (ORIF) ANKLE FRACTURE, LEFT;  Surgeon: Renette Butters, MD;  Location: [facility];  Service: Orthopedics;  Laterality: Left;  . TONSILLECTOMY     as child    Family History  Problem Relation Age of Onset  . Diabetes Mother   . Other Mother   . Dementia Mother   . Cancer Father   . Heart attack Father   . Diabetes Father   . Other Brother     Social History   Socioeconomic History  . Marital status: Single     Spouse name: Not on file  . Number of children: Not on file  . Years of education: Not on file  . Highest education level: Not on file  Occupational History  . Not on file  Tobacco Use  . Smoking status: Current Every Day Smoker    Packs/day: 0.50    Years: 20.00    Pack years: 10.00    Types: Cigarettes  . Smokeless tobacco: Never Used  Substance and Sexual Activity  . Alcohol use: Not Currently    Comment: rare use of liquor 3 times yearly; denied 07/19/19  . Drug use: No  . Sexual activity: Not on file  Other Topics Concern  . Not on file  Social History Narrative  . Not on file   Social Determinants of Health   Financial Resource Strain:   . Difficulty of Paying Living Expenses:   Food Insecurity:   . Worried About Charity fundraiser in the Last Year:   . Arboriculturist in the Last Year:   Transportation Needs:   . Film/video editor (Medical):   Marland Kitchen Lack of Transportation (Non-Medical):   Physical Activity:   . Days of Exercise per Week:   . Minutes of Exercise per Session:   Stress:   . Feeling of Stress :   Social Connections:   . Frequency of Communication with Friends and Family:   . Frequency of Social Gatherings with Friends and Family:   . Attends Religious Services:   . Active Member of Clubs or Organizations:   . Attends Archivist Meetings:   Marland Kitchen Marital Status:   Intimate Partner Violence:   . Fear of Current or Ex-Partner:   . Emotionally Abused:   Marland Kitchen Physically Abused:   . Sexually Abused:       ROS:  General: Negative for anorexia, weight loss, fever, chills, fatigue, weakness. Eyes: Negative for vision changes.  ENT: Negative for hoarseness, difficulty swallowing , nasal congestion. CV: Negative for chest pain, angina, palpitations, dyspnea on exertion, peripheral edema.  Respiratory: Negative for dyspnea at rest, dyspnea on exertion, cough, sputum, wheezing.  GI: See history of present illness. GU:  Negative for dysuria,  hematuria, urinary incontinence, urinary frequency, nocturnal urination.  MS: Negative for joint pain, low back pain.  Derm: Negative for rash or itching.  Neuro: Negative for weakness, abnormal sensation, seizure, frequent headaches, memory loss, confusion.  Psych: Negative for anxiety, depression, suicidal ideation, hallucinations.  Endo: Negative for unusual weight change.  Heme: Negative for bruising or bleeding. Allergy: Negative for rash or hives.    Physical Examination:  BP (!) 162/98   Pulse 75   Temp (!) 96.9 F (36.1 C) (Temporal)   Ht _0  (1.651 m)   Wt 169 lb 9.6 oz (76.9 kg)   BMI 28.22 kg/m    General: Well-nourished, well-developed in no acute distress.  Head: Normocephalic, atraumatic.  Eyes: Conjunctiva pink, no icterus. Mouth: masked. Neck: Supple without thyromegaly, masses, or lymphadenopathy.  Lungs: Clear to auscultation bilaterally.  Heart: Regular rate and rhythm, no murmurs rubs or gallops.  Abdomen: Bowel sounds are normal, minimal LLQ tenderness, nondistended, no hepatosplenomegaly or masses, no abdominal bruits or    hernia , no rebound or guarding.   Rectal: not performed Extremities: No lower extremity edema. No clubbing or deformities.  Neuro: Alert and oriented x 4 , grossly normal neurologically.  Skin: Warm and dry, no rash or jaundice.   Psych: Alert and cooperative, normal mood and affect.  Labs: Labs dated June 16, 2019 A1c 14%, white blood cell count 13,300, hemoglobin 16.5, platelets 105,000, glucose 387, creatinine 0.55, albumin 4, total bilirubin 0.5, alk phos 147 elevated, AST 18, ALT 23, amylase 21, lipase 8, TSH 2.450  Imaging Studies: No results found.

## 2019-07-20 ENCOUNTER — Encounter: Payer: Self-pay | Admitting: *Deleted

## 2019-07-23 ENCOUNTER — Encounter: Payer: Self-pay | Admitting: Gastroenterology

## 2019-07-23 NOTE — Assessment & Plan Note (Signed)
Pleasant 64 y/o female presenting with couple month history of LLQ pain associated with N/V. Prior history of kidney stones but none seen on recent CT. She had diverticulosis but no evidence of diverticulitis and course of Cipro/Flagyl did not help with pain. Her N/V has resolved. She is having some diarrhea which I suspect is related to recent increase in metformin, less likely infectious etiology.   We will plan for colonoscopy in near future. Request anesthesiology assistance because of patient's concerns regarding reaction to anesthesia. She reports fighting with anesthesia while waking up after one of her procedures, she believes due to propofol. Did fine after gb surgery.  I have discussed the risks, alternatives, benefits with regards to but not limited to the risk of reaction to medication, bleeding, infection, perforation and the patient is agreeable to proceed. Written consent to be obtained.  In the interim, trial of bentyl before meals and at bedtime. If persistent diarrhea, she will let me know. If pain recurs/persists, she will let us know as well.   She continues to have elevated BP untreated. Requested she follow up with PCP this week.

## 2019-10-12 ENCOUNTER — Telehealth: Payer: Self-pay | Admitting: *Deleted

## 2019-10-12 NOTE — Telephone Encounter (Signed)
Eber Jones called and informed us that pt called her to cancel her procedure for 10/19/19.  Eber Jones said she did not give her a reason so she did not know if she wanted to reschedule.  Tried to call pt back but had to [facility].  Routing to [employer] as FYI.

## 2019-10-13 NOTE — Telephone Encounter (Signed)
Tried to call pt, no answer, LMOVM for return call.  

## 2019-10-17 ENCOUNTER — Other Ambulatory Visit (HOSPITAL_COMMUNITY): Payer: 59

## 2019-10-17 ENCOUNTER — Encounter (HOSPITAL_COMMUNITY): Payer: 59

## 2019-10-19 ENCOUNTER — Encounter (HOSPITAL_COMMUNITY): Payer: Self-pay

## 2019-10-19 ENCOUNTER — Ambulatory Visit (HOSPITAL_COMMUNITY): Admit: 2019-10-19 | Payer: 59 | Admitting: Internal Medicine

## 2019-10-19 SURGERY — COLONOSCOPY WITH PROPOFOL
Anesthesia: Monitor Anesthesia Care

## 2019-11-23 ENCOUNTER — Other Ambulatory Visit: Payer: Self-pay

## 2019-11-23 ENCOUNTER — Emergency Department (HOSPITAL_COMMUNITY)
Admission: EM | Admit: 2019-11-23 | Discharge: 2019-11-24 | Disposition: A | Discharge status: Home / Self Care | Payer: 59 | Attending: Emergency Medicine | Admitting: Emergency Medicine

## 2019-11-23 ENCOUNTER — Encounter (HOSPITAL_COMMUNITY): Payer: Self-pay | Admitting: *Deleted

## 2019-11-23 ENCOUNTER — Emergency Department (HOSPITAL_COMMUNITY): Payer: 59

## 2019-11-23 DIAGNOSIS — Z7984 Long term (current) use of oral hypoglycemic drugs: Secondary | ICD-10-CM | POA: Insufficient documentation

## 2019-11-23 DIAGNOSIS — E119 Type 2 diabetes mellitus without complications: Secondary | ICD-10-CM | POA: Insufficient documentation

## 2019-11-23 DIAGNOSIS — E039 Hypothyroidism, unspecified: Secondary | ICD-10-CM | POA: Insufficient documentation

## 2019-11-23 DIAGNOSIS — I10 Essential (primary) hypertension: Secondary | ICD-10-CM | POA: Insufficient documentation

## 2019-11-23 DIAGNOSIS — R0789 Other chest pain: Secondary | ICD-10-CM | POA: Diagnosis present

## 2019-11-23 DIAGNOSIS — F17210 Nicotine dependence, cigarettes, uncomplicated: Secondary | ICD-10-CM | POA: Insufficient documentation

## 2019-11-23 MED ORDER — IBUPROFEN 400 MG PO TABS
400.0000 mg | ORAL_TABLET | Freq: Once | ORAL | Status: AC
  Administered 2019-11-24: 400 mg via ORAL
  Filled 2019-11-23: qty 1

## 2019-11-23 NOTE — ED Provider Notes (Signed)
[facility] EMERGENCY DEPARTMENT Provider Note   CSN: [id number] Arrival date & time: 11/23/19  1732   History Chief Complaint  Patient presents with  . Breast Pain    Maria Leblanc is a 64 y.o. female.  The history is provided by the patient.  She has history of hypertension, diabetes and comes in because of left-sided chest pain.  She had recently completed a course of steroids and antibiotics for a sinus infection.  2 days ago, she developed a severe, burning pain in the left upper back, which is now radiating around to the left anterior chest.  Her skin is very sensitive to where she does not want anything to touch it.  The pain in the anterior chest feels different, but she cannot characterize it.  She rates it at 10/10.  Nothing makes it better, it is worse with anything touching it.  She denies fever, chills, sweats.  It is uncomfortable taking a deep breath, but there is no true dyspnea.  There is no nausea or diaphoresis.  She has not taken anything for pain.  She is a cigarette smoker.  There is no history of hyperlipidemia.  Past Medical History:  Diagnosis Date  . Ankle fracture 02/18/2016   left  . Borderline diabetes   . Complication of anesthesia    "fighting with anesthesia when waking up"  . Diverticulosis   . Headache   . History of kidney stones   . Hypothyroid    no longer needs med    Patient Active Problem List   Diagnosis Date Noted  . LLQ pain 07/19/2019  . N&V (nausea and vomiting) 07/19/2019  . Diarrhea 07/19/2019  . Perirectal abscess   . Calculus of gallbladder with acute cholecystitis without obstruction   . Sepsis (HCC) 03/27/2017  . RUQ pain 03/27/2017  . Hyponatremia 03/27/2017  . Thrombocytopenia (HCC) 03/27/2017  . Diabetes mellitus type II, non insulin dependent (HCC) 03/27/2017  . Adrenal nodule (HCC) 03/27/2017    Past Surgical History:  Procedure Laterality Date  . CHOLECYSTECTOMY N/A 03/29/2017   Procedure: LAPAROSCOPIC  CHOLECYSTECTOMY;  Surgeon: Lucretia Roers, MD;  Location: [facility];  Service: General;  Laterality: N/A;  . CYST REMOVAL NECK     x3  . DIAGNOSTIC LAPAROSCOPY     as teenager  . INCISION AND DRAINAGE PERIRECTAL ABSCESS  03/29/2017   Procedure: INCISION  AND DRAINAGE PERIRECTAL ABSCESS;  Surgeon: Lucretia Roers, MD;  Location: [facility];  Service: General;;  . ORIF ANKLE FRACTURE Left 02/28/2016   Procedure: OPEN REDUCTION INTERNAL FIXATION (ORIF) ANKLE FRACTURE, LEFT;  Surgeon: Sheral Apley, MD;  Location: [facility];  Service: Orthopedics;  Laterality: Left;  . TONSILLECTOMY     as child     OB History   No obstetric history on file.     Family History  Problem Relation Age of Onset  . Diabetes Mother   . Other Mother   . Dementia Mother   . Cancer Father   . Heart attack Father   . Diabetes Father   . Other Brother     Social History   Tobacco Use  . Smoking status: Current Every Day Smoker    Packs/day: 0.50    Years: 20.00    Pack years: 10.00    Types: Cigarettes  . Smokeless tobacco: Never Used  Substance Use Topics  . Alcohol use: Not Currently    Comment: rare use of liquor 3 times yearly; denied 07/19/19  .  Drug use: No    Home Medications Prior to Admission medications   Medication Sig Start Date End Date Taking? Authorizing Provider  acetaminophen (TYLENOL) 325 MG tablet Take 650 mg by mouth every 6 (six) hours as needed.    [provider]  dicyclomine (BENTYL) 10 MG capsule Take one capsule before meals and at bedtime as needed for abdominal pain, loose stool. 07/19/19   Tiffany Kocher, PA-C  ibuprofen (ADVIL) 200 MG tablet Take 200 mg by mouth every 6 (six) hours as needed.    [provider]  metFORMIN (GLUCOPHAGE) 1000 MG tablet Take 1,000 mg by mouth 2 (two) times daily. 06/29/19   [provider]    Allergies    Propofol  Review of Systems   Review of Systems  All other systems reviewed and are  negative.   Physical Exam Updated Vital Signs BP (!) 140/93   Pulse 95   Temp 98.5 F (36.9 C)   Resp 20   Physical Exam Vitals and nursing note reviewed.   64 year old female, resting comfortably and in no acute distress. Vital signs are significant for borderline elevated blood pressure. Oxygen saturation is 92%, which is normal. Head is normocephalic and atraumatic. PERRLA, EOMI. Oropharynx is clear. Neck is nontender and supple without adenopathy or JVD. Back has moderate tenderness in the left upper back.  There is no midline tenderness.  There is no CVA tenderness.  No rash is seen. Lungs are clear without rales, wheezes, or rhonchi. Chest is moderately tender in the left anterior chest wall.  There is no crepitus.  No rash is seen. Heart has regular rate and rhythm without murmur. Abdomen is soft, flat, nontender without masses or hepatosplenomegaly and peristalsis is normoactive. Extremities have no cyanosis or edema, full range of motion is present. Skin is warm and dry without rash. Neurologic: Mental status is normal, cranial nerves are intact, there are no motor or sensory deficits.  ED Results / Procedures / Treatments   Labs (all labs ordered are listed, but only abnormal results are displayed) Labs Reviewed  BASIC METABOLIC PANEL - Abnormal; Notable for the following components:      Result Value   Sodium 133 (*)    Chloride 96 (*)    Glucose, Bld 388 (*)    All other components within normal limits  CBC WITH DIFFERENTIAL/PLATELET - Abnormal; Notable for the following components:   WBC 17.9 (*)    RBC 5.49 (*)    Hemoglobin 16.9 (*)    HCT 51.3 (*)    Neutro Abs 13.6 (*)    Monocytes Absolute 1.1 (*)    Abs Immature Granulocytes 0.09 (*)    All other components within normal limits  TROPONIN I (HIGH SENSITIVITY)    EKG EKG Interpretation  Date/Time:  Friday November 24 2019 00:07:51 EDT Ventricular Rate:  76 PR Interval:    QRS Duration: 96 QT  Interval:  397 QTC Calculation: 447 R Axis:   -33 Text Interpretation: Sinus rhythm Probable left atrial enlargement Left axis deviation Abnormal R-wave progression, late transition Abnormal inferior Q waves Minimal ST depression, lateral leads When compared with ECG of 02/12/2019, Left posterior fasicular block is no longer present Confirmed by Dione Booze ([id number]) on 11/24/2019 12:23:22 AM   Radiology DG Chest Port 1 View  Result Date: 11/23/2019 CLINICAL DATA:  Chest pain EXAM: PORTABLE CHEST 1 VIEW COMPARISON:  02/12/2019 FINDINGS: The heart size and mediastinal contours are within normal limits. Both lungs  are clear. The visualized skeletal structures are unremarkable. Minimal scarring at the right base. IMPRESSION: No active disease. Electronically Signed   By: Jasmine Pang M.D.   On: 11/23/2019 23:44    Procedures Procedures   Medications Ordered in ED Medications  ibuprofen (ADVIL) tablet 400 mg (400 mg Oral Given 11/24/19 0020)    ED Course  I have reviewed the triage vital signs and the nursing notes.  Pertinent labs & imaging results that were available during my care of the patient were reviewed by me and considered in my medical decision making (see chart for details).  MDM Rules/Calculators/A&P Chest pain and pattern strongly suggestive of herpes zoster.  No rashes present which would argue against that, but pain frequently precedes development of rash.  Symptoms are quite atypical for ACS, pulmonary embolism.  Doubt pneumonia.  However, will check chest x-ray, ECG, screening labs including single troponin.  Old records are reviewed, and she does have a prior ED visit for chest wall pain.  ECG is unchanged from prior, chest x-ray is normal, troponin is normal.  She was given a dose of ibuprofen which has not given her significant relief.  She will be discharged with prescription for oxycodone-acetaminophen and advised to follow-up with PCP.  Told to see a medical provider  soon as possible should a rash develop.  Final Clinical Impression(s) / ED Diagnoses Final diagnoses:  Atypical chest pain  Elevated blood pressure reading with diagnosis of hypertension    Rx / DC Orders ED Discharge Orders         Ordered    oxyCODONE-acetaminophen (PERCOCET) 5-325 MG tablet  Every 4 hours PRN     Discontinue  Reprint     11/24/19 0137    oxyCODONE-acetaminophen (PERCOCET) 5-325 MG tablet  Every 4 hours PRN     Discontinue  Reprint     11/24/19 0137           Dione Booze, MD 11/24/19 [id number]

## 2019-11-23 NOTE — ED Triage Notes (Signed)
Left breast pain for 2 days

## 2019-11-24 LAB — BASIC METABOLIC PANEL
Anion gap: 10 (ref 5–15)
BUN: 20 mg/dL (ref 8–23)
CO2: 27 mmol/L (ref 22–32)
Calcium: 10.1 mg/dL (ref 8.9–10.3)
Chloride: 96 mmol/L — ABNORMAL LOW (ref 98–111)
Creatinine, Ser: 0.55 mg/dL (ref 0.44–1.00)
GFR calc Af Amer: >60 mL/min (ref >60)
GFR calc non Af Amer: >60 mL/min (ref >60)
Glucose, Bld: 388 mg/dL — ABNORMAL HIGH (ref 70–99)
Potassium: 3.9 mmol/L (ref 3.5–5.1)
Sodium: 133 mmol/L — ABNORMAL LOW (ref 135–145)

## 2019-11-24 LAB — CBC WITH DIFFERENTIAL/PLATELET
Abs Immature Granulocytes: 0.09 10*3/uL — ABNORMAL HIGH (ref 0.00–0.07)
Basophils Absolute: 0.1 10*3/uL (ref 0.0–0.1)
Basophils Relative: 0 %
Eosinophils Absolute: 0.2 10*3/uL (ref 0.0–0.5)
Eosinophils Relative: 1 %
HCT: 51.3 % — ABNORMAL HIGH (ref 36.0–46.0)
Hemoglobin: 16.9 g/dL — ABNORMAL HIGH (ref 12.0–15.0)
Immature Granulocytes: 1 %
Lymphocytes Relative: 16 %
Lymphs Abs: 2.9 10*3/uL (ref 0.7–4.0)
MCH: 30.8 pg (ref 26.0–34.0)
MCHC: 32.9 g/dL (ref 30.0–36.0)
MCV: 93.4 fL (ref 80.0–100.0)
Monocytes Absolute: 1.1 10*3/uL — ABNORMAL HIGH (ref 0.1–1.0)
Monocytes Relative: 6 %
Neutro Abs: 13.6 10*3/uL — ABNORMAL HIGH (ref 1.7–7.7)
Neutrophils Relative %: 76 %
Platelets: 222 10*3/uL (ref 150–400)
RBC: 5.49 MIL/uL — ABNORMAL HIGH (ref 3.87–5.11)
RDW: 12.8 % (ref 11.5–15.5)
WBC: 17.9 10*3/uL — ABNORMAL HIGH (ref 4.0–10.5)
nRBC: 0 % (ref 0.0–0.2)

## 2019-11-24 LAB — TROPONIN I (HIGH SENSITIVITY): Troponin I (High Sensitivity): 11 ng/L (ref <18)

## 2019-11-24 MED ORDER — OXYCODONE-ACETAMINOPHEN 5-325 MG PO TABS: 1.0000 | ORAL_TABLET | ORAL | 0 refills | Status: DC | PRN

## 2019-11-24 NOTE — Discharge Instructions (Addendum)
I am concerned that your pain may be from shingles.  Please watch the area closely.  If you develop a rash, you need to see a medical provider as soon as possible to get started on medication that is very specific for shingles.  In the meantime, may take ibuprofen or naproxen as needed for pain.  Take oxycodone-acetaminophen for severe pain.  Please note that you can take ibuprofen and naproxen along with oxycodone-acetaminophen, and the combination will give you better pain relief.

## 2019-11-27 MED FILL — Oxycodone w/ Acetaminophen Tab 5-325 MG: ORAL | Qty: 6 | Status: AC

## 2020-10-28 ENCOUNTER — Encounter (HOSPITAL_COMMUNITY): Payer: Self-pay | Admitting: *Deleted

## 2020-10-28 ENCOUNTER — Emergency Department (HOSPITAL_COMMUNITY): Payer: Self-pay

## 2020-10-28 ENCOUNTER — Inpatient Hospital Stay (HOSPITAL_COMMUNITY)
Admission: EM | Admit: 2020-10-28 | Discharge: 2020-10-30 | DRG: 638 | Disposition: A | Discharge status: Home / Self Care | Payer: Self-pay | Attending: Internal Medicine | Admitting: Internal Medicine

## 2020-10-28 DIAGNOSIS — E11621 Type 2 diabetes mellitus with foot ulcer: Principal | ICD-10-CM | POA: Diagnosis present

## 2020-10-28 DIAGNOSIS — L03119 Cellulitis of unspecified part of limb: Secondary | ICD-10-CM | POA: Diagnosis present

## 2020-10-28 DIAGNOSIS — Z20822 Contact with and (suspected) exposure to covid-19: Secondary | ICD-10-CM | POA: Diagnosis present

## 2020-10-28 DIAGNOSIS — E119 Type 2 diabetes mellitus without complications: Secondary | ICD-10-CM

## 2020-10-28 DIAGNOSIS — L0390 Cellulitis, unspecified: Secondary | ICD-10-CM | POA: Diagnosis present

## 2020-10-28 DIAGNOSIS — Z7984 Long term (current) use of oral hypoglycemic drugs: Secondary | ICD-10-CM

## 2020-10-28 DIAGNOSIS — Z833 Family history of diabetes mellitus: Secondary | ICD-10-CM

## 2020-10-28 DIAGNOSIS — E1140 Type 2 diabetes mellitus with diabetic neuropathy, unspecified: Secondary | ICD-10-CM | POA: Diagnosis present

## 2020-10-28 DIAGNOSIS — I10 Essential (primary) hypertension: Secondary | ICD-10-CM | POA: Diagnosis present

## 2020-10-28 DIAGNOSIS — L97509 Non-pressure chronic ulcer of other part of unspecified foot with unspecified severity: Secondary | ICD-10-CM

## 2020-10-28 DIAGNOSIS — Z8249 Family history of ischemic heart disease and other diseases of the circulatory system: Secondary | ICD-10-CM

## 2020-10-28 DIAGNOSIS — L97529 Non-pressure chronic ulcer of other part of left foot with unspecified severity: Secondary | ICD-10-CM | POA: Diagnosis present

## 2020-10-28 DIAGNOSIS — E13621 Other specified diabetes mellitus with foot ulcer: Secondary | ICD-10-CM

## 2020-10-28 DIAGNOSIS — E039 Hypothyroidism, unspecified: Secondary | ICD-10-CM | POA: Diagnosis present

## 2020-10-28 DIAGNOSIS — F17210 Nicotine dependence, cigarettes, uncomplicated: Secondary | ICD-10-CM | POA: Diagnosis present

## 2020-10-28 DIAGNOSIS — L03116 Cellulitis of left lower limb: Secondary | ICD-10-CM | POA: Diagnosis present

## 2020-10-28 DIAGNOSIS — L089 Local infection of the skin and subcutaneous tissue, unspecified: Secondary | ICD-10-CM

## 2020-10-28 DIAGNOSIS — Z79899 Other long term (current) drug therapy: Secondary | ICD-10-CM

## 2020-10-28 LAB — CBC WITH DIFFERENTIAL/PLATELET
Abs Immature Granulocytes: 0.04 10*3/uL (ref 0.00–0.07)
Basophils Absolute: 0.1 10*3/uL (ref 0.0–0.1)
Basophils Relative: 0 %
Eosinophils Absolute: 0.3 10*3/uL (ref 0.0–0.5)
Eosinophils Relative: 2 %
HCT: 47 % — ABNORMAL HIGH (ref 36.0–46.0)
Hemoglobin: 15.1 g/dL — ABNORMAL HIGH (ref 12.0–15.0)
Immature Granulocytes: 0 %
Lymphocytes Relative: 12 %
Lymphs Abs: 1.7 10*3/uL (ref 0.7–4.0)
MCH: 30.5 pg (ref 26.0–34.0)
MCHC: 32.1 g/dL (ref 30.0–36.0)
MCV: 94.9 fL (ref 80.0–100.0)
Monocytes Absolute: 1 10*3/uL (ref 0.1–1.0)
Monocytes Relative: 6 %
Neutro Abs: 12 10*3/uL — ABNORMAL HIGH (ref 1.7–7.7)
Neutrophils Relative %: 80 %
Platelets: 179 10*3/uL (ref 150–400)
RBC: 4.95 MIL/uL (ref 3.87–5.11)
RDW: 13.3 % (ref 11.5–15.5)
WBC: 15.1 10*3/uL — ABNORMAL HIGH (ref 4.0–10.5)
nRBC: 0 % (ref 0.0–0.2)

## 2020-10-28 LAB — BASIC METABOLIC PANEL
Anion gap: 7 (ref 5–15)
BUN: 24 mg/dL — ABNORMAL HIGH (ref 8–23)
CO2: 26 mmol/L (ref 22–32)
Calcium: 10.1 mg/dL (ref 8.9–10.3)
Chloride: 106 mmol/L (ref 98–111)
Creatinine, Ser: 0.56 mg/dL (ref 0.44–1.00)
GFR, Estimated: >60 mL/min (ref >60)
Glucose, Bld: 94 mg/dL (ref 70–99)
Potassium: 4.3 mmol/L (ref 3.5–5.1)
Sodium: 139 mmol/L (ref 135–145)

## 2020-10-28 LAB — GLUCOSE, CAPILLARY: Glucose-Capillary: 129 mg/dL — ABNORMAL HIGH (ref 70–99)

## 2020-10-28 LAB — HIV ANTIBODY (ROUTINE TESTING W REFLEX): HIV Screen 4th Generation wRfx: NONREACTIVE

## 2020-10-28 LAB — CBG MONITORING, ED: Glucose-Capillary: 99 mg/dL (ref 70–99)

## 2020-10-28 LAB — LACTIC ACID, PLASMA
Lactic Acid, Venous: 1.3 mmol/L (ref 0.5–1.9)
Lactic Acid, Venous: 1 mmol/L (ref 0.5–1.9)

## 2020-10-28 LAB — RESP PANEL BY RT-PCR (FLU A&B, COVID) ARPGX2
Influenza A by PCR: NEGATIVE
Influenza B by PCR: NEGATIVE
SARS Coronavirus 2 by RT PCR: NEGATIVE

## 2020-10-28 MED ORDER — ACETAMINOPHEN 650 MG RE SUPP: 650.0000 mg | Freq: Four times a day (QID) | RECTAL | Status: DC | PRN

## 2020-10-28 MED ORDER — BUSPIRONE HCL 5 MG PO TABS
5.0000 mg | ORAL_TABLET | Freq: Every day | ORAL | Status: DC
  Administered 2020-10-29 – 2020-10-30 (×2): 5 mg via ORAL
  Filled 2020-10-28 (×2): qty 1

## 2020-10-28 MED ORDER — VANCOMYCIN HCL 1500 MG/300ML IV SOLN
1500.0000 mg | Freq: Once | INTRAVENOUS | Status: AC
  Administered 2020-10-28: 1500 mg via INTRAVENOUS
  Filled 2020-10-28: qty 300

## 2020-10-28 MED ORDER — HEPARIN SODIUM (PORCINE) 5000 UNIT/ML IJ SOLN
5000.0000 [IU] | Freq: Three times a day (TID) | INTRAMUSCULAR | Status: DC
  Administered 2020-10-28 – 2020-10-30 (×6): 5000 [IU] via SUBCUTANEOUS
  Filled 2020-10-28 (×6): qty 1

## 2020-10-28 MED ORDER — VANCOMYCIN HCL IN DEXTROSE 1-5 GM/200ML-% IV SOLN: 1000.0000 mg | Freq: Once | INTRAVENOUS | Status: DC

## 2020-10-28 MED ORDER — AMPICILLIN-SULBACTAM SODIUM 3 (2-1) G IJ SOLR
3.0000 g | Freq: Once | INTRAMUSCULAR | Status: AC
Start: 2020-10-28 — End: 2020-10-28
  Administered 2020-10-28: 15:00:00 3 g via INTRAVENOUS
  Filled 2020-10-28: qty 8

## 2020-10-28 MED ORDER — SODIUM CHLORIDE 0.9 % IV BOLUS
1000.0000 mL | Freq: Once | INTRAVENOUS | Status: AC
  Administered 2020-10-28: 14:00:00 1000 mL via INTRAVENOUS

## 2020-10-28 MED ORDER — INSULIN ASPART 100 UNIT/ML IJ SOLN
0.0000 [IU] | Freq: Three times a day (TID) | INTRAMUSCULAR | Status: DC
  Administered 2020-10-30 (×2): 2 [IU] via SUBCUTANEOUS

## 2020-10-28 MED ORDER — VANCOMYCIN HCL 750 MG/150ML IV SOLN
750.0000 mg | Freq: Two times a day (BID) | INTRAVENOUS | Status: DC
  Administered 2020-10-29 – 2020-10-30 (×3): 750 mg via INTRAVENOUS
  Filled 2020-10-28 (×4): qty 150

## 2020-10-28 MED ORDER — MORPHINE SULFATE (PF) 4 MG/ML IV SOLN
4.0000 mg | Freq: Once | INTRAVENOUS | Status: AC
  Administered 2020-10-28: 15:00:00 4 mg via INTRAVENOUS
  Filled 2020-10-28: qty 1

## 2020-10-28 MED ORDER — SODIUM CHLORIDE 0.9 % IV SOLN
2.0000 g | Freq: Once | INTRAVENOUS | Status: AC
  Administered 2020-10-28: 2 g via INTRAVENOUS
  Filled 2020-10-28: qty 2

## 2020-10-28 MED ORDER — LISINOPRIL 10 MG PO TABS
40.0000 mg | ORAL_TABLET | Freq: Every day | ORAL | Status: DC
  Administered 2020-10-29 – 2020-10-30 (×2): 40 mg via ORAL
  Filled 2020-10-28 (×2): qty 4

## 2020-10-28 MED ORDER — SODIUM CHLORIDE 0.9 % IV SOLN
2.0000 g | Freq: Three times a day (TID) | INTRAVENOUS | Status: DC
  Administered 2020-10-28 – 2020-10-30 (×5): 2 g via INTRAVENOUS
  Filled 2020-10-28 (×5): qty 2

## 2020-10-28 MED ORDER — ACETAMINOPHEN 325 MG PO TABS
650.0000 mg | ORAL_TABLET | Freq: Four times a day (QID) | ORAL | Status: DC | PRN
  Administered 2020-10-29 – 2020-10-30 (×4): 650 mg via ORAL
  Filled 2020-10-28 (×4): qty 2

## 2020-10-28 MED ORDER — ALBUTEROL SULFATE HFA 108 (90 BASE) MCG/ACT IN AERS: 1.0000 | INHALATION_SPRAY | Freq: Four times a day (QID) | RESPIRATORY_TRACT | Status: DC | PRN

## 2020-10-28 MED ORDER — SODIUM CHLORIDE 0.9 % IV SOLN: INTRAVENOUS | Status: AC

## 2020-10-28 MED ORDER — ONDANSETRON HCL 4 MG/2ML IJ SOLN
4.0000 mg | Freq: Once | INTRAMUSCULAR | Status: AC
  Administered 2020-10-28: 15:00:00 4 mg via INTRAVENOUS
  Filled 2020-10-28: qty 2

## 2020-10-28 NOTE — Progress Notes (Signed)
Pharmacy Antibiotic Note  Maria Leblanc a 65 y.o. female admitted on 10/28/2020 with cellulitis.  Pharmacy has been consulted for vancomycin and cefepime dosing.  Plan: Vancomycin 750mg  IV every 12 hours.  Goal trough 15-20 mcg/mL.  Cefepime 2gm IV every 8 hours.  Medical History: Past Medical History:  Diagnosis Date   Ankle fracture 02/18/2016   left   Borderline diabetes    Complication of anesthesia    "fighting with anesthesia when waking up"   Diverticulosis    Headache    History of kidney stones    Hypothyroid    no longer needs med    Allergies:  Allergies  Allergen Reactions   Propofol Other (See Comments)    Patient reports "it causes me to fight them when i'm waking up"    [facility] Weights   10/28/20 1546  Weight: 74.8 kg (165 lb)    CBC Latest Ref Rng & Units 10/28/2020 11/23/2019 02/12/2019  WBC 4.0 - 10.5 K/uL 15.1(H) 17.9(H) 10.5  Hemoglobin 12.0 - 15.0 g/dL 15.1(H) 16.9(H) 15.3(H)  Hematocrit 36.0 - 46.0 % 47.0(H) 51.3(H) 45.6  Platelets 150 - 400 K/uL 179 222 108(L)     Estimated Creatinine Clearance: 71.9 mL/min (by C-G formula based on SCr of 0.56 mg/dL).  Antibiotics Given (last 72 hours)     Date/Time Action Medication Dose Rate   10/28/20 1456 New Bag/Given   Ampicillin-Sulbactam (UNASYN) 3 g in sodium chloride 0.9 % 100 mL IVPB 3 g 200 mL/hr   10/28/20 1623 New Bag/Given   ceFEPIme (MAXIPIME) 2 g in sodium chloride 0.9 % 100 mL IVPB 2 g 200 mL/hr       Antimicrobials this admission:  Cefepime 10/28/2020  >>  vancomycin 10/28/2020  >>   Microbiology results: 10/28/2020  BCx: sent 10/28/2020  Resp Panel: sent   Thank you for allowing pharmacy to be a part of this patient's care.  12/29/2020, PharmD Clinical Pharmacist

## 2020-10-28 NOTE — ED Notes (Signed)
Lab at bedside obtaining blood cultures.

## 2020-10-28 NOTE — ED Provider Notes (Signed)
[facility] EMERGENCY DEPARTMENT Provider Note   CSN: [id number] Arrival date & time: 10/28/20  1046     History Chief Complaint  Patient presents with   Foot Pain    Maria Leblanc is a 65 y.o. female.   Foot Pain Pertinent negatives include no chest pain, no headaches and no shortness of breath.       Maria Leblanc is a 65 y.o. female with past medical history of type 2 diabetes who presents to the Emergency Department for evaluation of a nonhealing wound to her left foot.  She states that she noticed a blister 3 to 4 days ago.  When she noticed the wound, she describes having "loose skin" to the wound that she trimmed off.  Yesterday, she noticed increasing pain swelling and redness of her great toe.  She describes having some pins-and-needles sensation to her left foot.  Denies history of diabetic neuropathy.  She also noticed discoloration and mild pain to the second toe as well.  She denies known injury, fever, chills, nausea or vomiting.  She is clean the wounds with peroxide and applied Neosporin.   Past Medical History:  Diagnosis Date   Ankle fracture 02/18/2016   left   Borderline diabetes    Complication of anesthesia    "fighting with anesthesia when waking up"   Diverticulosis    Headache    History of kidney stones    Hypothyroid    no longer needs med    Patient Active Problem List   Diagnosis Date Noted   LLQ pain 07/19/2019   N&V (nausea and vomiting) 07/19/2019   Diarrhea 07/19/2019   Perirectal abscess    Calculus of gallbladder with acute cholecystitis without obstruction    Sepsis (HCC) 03/27/2017   RUQ pain 03/27/2017   Hyponatremia 03/27/2017   Thrombocytopenia (HCC) 03/27/2017   Diabetes mellitus type II, non insulin dependent (HCC) 03/27/2017   Adrenal nodule (HCC) 03/27/2017    Past Surgical History:  Procedure Laterality Date   CHOLECYSTECTOMY N/A 03/29/2017   Procedure: LAPAROSCOPIC CHOLECYSTECTOMY;  Surgeon: Lucretia Roers,  MD;  Location: [facility];  Service: General;  Laterality: N/A;   CYST REMOVAL NECK     x3   DIAGNOSTIC LAPAROSCOPY     as teenager   INCISION AND DRAINAGE PERIRECTAL ABSCESS  03/29/2017   Procedure: INCISION  AND DRAINAGE PERIRECTAL ABSCESS;  Surgeon: Lucretia Roers, MD;  Location: [facility];  Service: General;;   ORIF ANKLE FRACTURE Left 02/28/2016   Procedure: OPEN REDUCTION INTERNAL FIXATION (ORIF) ANKLE FRACTURE, LEFT;  Surgeon: Sheral Apley, MD;  Location: [facility];  Service: Orthopedics;  Laterality: Left;   TONSILLECTOMY     as child     OB History   No obstetric history on file.     Family History  Problem Relation Age of Onset   Diabetes Mother    Other Mother    Dementia Mother    Cancer Father    Heart attack Father    Diabetes Father    Other Brother     Social History   Tobacco Use   Smoking status: Every Day    Packs/day: 0.50    Years: 20.00    Pack years: 10.00    Types: Cigarettes   Smokeless tobacco: Never  Substance Use Topics   Alcohol use: Not Currently    Comment: rare use of liquor 3 times yearly; denied 07/19/19   Drug use: No  Home Medications Prior to Admission medications   Medication Sig Start Date End Date Taking? Authorizing Provider  acetaminophen (TYLENOL) 325 MG tablet Take 650 mg by mouth every 6 (six) hours as needed.   Yes [provider]  albuterol (VENTOLIN HFA) 108 (90 Base) MCG/ACT inhaler Inhale 1 puff into the lungs 4 (four) times daily as needed. 08/02/20  Yes [provider]  busPIRone (BUSPAR) 5 MG tablet Take 5 mg by mouth daily.   Yes [provider]  dapagliflozin propanediol (FARXIGA) 10 MG TABS tablet Take 10 mg by mouth daily.   Yes [provider]  glipiZIDE (GLUCOTROL XL) 5 MG 24 hr tablet Take 5 mg by mouth daily. 08/29/20  Yes [provider]  ibuprofen (ADVIL) 200 MG tablet Take 200 mg by mouth every 6 (six) hours as needed.   Yes [provider]  lisinopril (ZESTRIL) 40 MG tablet Take 40 mg by mouth daily. 09/04/20  Yes [provider]  metFORMIN (GLUCOPHAGE) 1000 MG tablet Take 1,000 mg by mouth 2 (two) times daily. 06/29/19  Yes [provider]  dicyclomine (BENTYL) 10 MG capsule Take one capsule before meals and at bedtime as needed for abdominal pain, loose stool. Patient not taking: No sig reported 07/19/19   Tiffany Kocher, PA-C  oxyCODONE-acetaminophen (PERCOCET) 5-325 MG tablet Take 1 tablet by mouth every 4 (four) hours as needed for moderate pain. Patient not taking: No sig reported 11/24/19   Dione Booze, MD  oxyCODONE-acetaminophen (PERCOCET) 5-325 MG tablet Take 1 tablet by mouth every 4 (four) hours as needed for moderate pain. Patient not taking: No sig reported 11/24/19   Dione Booze, MD    Allergies    Propofol  Review of Systems   Review of Systems  Constitutional:  Negative for chills and fever.  Respiratory:  Negative for shortness of breath.   Cardiovascular:  Negative for chest pain.  Gastrointestinal:  Negative for nausea and vomiting.  Musculoskeletal:  Positive for arthralgias (Left foot pain).  Skin:  Positive for color change and wound.       Blister, redness, swelling and pain left great toe and second toe  Neurological:  Positive for numbness (Numbness and tingling of her left foot). Negative for dizziness, weakness and headaches.   Physical Exam Updated Vital Signs BP (!) 167/76 (BP Location: Left Arm)   Pulse 65   Temp 98.2 F (36.8 C) (Oral)   Resp 16   SpO2 98%   Physical Exam Vitals and nursing note reviewed.  Constitutional:      Appearance: Normal appearance. She is not toxic-appearing.  HENT:     Head: Normocephalic.  Cardiovascular:     Rate and Rhythm: Normal rate and regular rhythm.     Pulses: Normal pulses.     Comments: Patient has palpable dorsalis pedis and posterior tibial pulses bilaterally. Pulmonary:     Effort: Pulmonary effort is normal.      Breath sounds: Normal breath sounds. No wheezing.  Abdominal:     Palpations: Abdomen is soft.     Tenderness: There is no abdominal tenderness. There is no guarding or rebound.  Musculoskeletal:        General: Tenderness present. Normal range of motion.     Cervical back: Normal range of motion.     Comments: Patient has open wound with to the lateral aspect of the left great toe.  Small open wound of the second toe as well. erythema and edema noted to the  dorsal surface of the foot.  No lymphangitis.  No purulent drainage noted.  See attached photos  Skin:    General: Skin is warm.     Capillary Refill: Capillary refill takes less than 2 seconds.     Findings: Erythema present.  Neurological:     General: No focal deficit present.     Mental Status: She is alert.     Sensory: No sensory deficit.     Motor: No weakness.       ED Results / Procedures / Treatments   Labs (all labs ordered are listed, but only abnormal results are displayed) Labs Reviewed  CBC WITH DIFFERENTIAL/PLATELET - Abnormal; Notable for the following components:      Result Value   WBC 15.1 (*)    Hemoglobin 15.1 (*)    HCT 47.0 (*)    Neutro Abs 12.0 (*)    All other components within normal limits  BASIC METABOLIC PANEL - Abnormal; Notable for the following components:   BUN 24 (*)    All other components within normal limits  CULTURE, BLOOD (ROUTINE X 2)  CULTURE, BLOOD (ROUTINE X 2)  RESP PANEL BY RT-PCR (FLU A&B, COVID) ARPGX2  LACTIC ACID, PLASMA  LACTIC ACID, PLASMA    EKG None  Radiology DG Foot Complete Left  Result Date: 10/28/2020 CLINICAL DATA:  Right foot wound. EXAM: RIGHT FOOT COMPLETE - 3+ VIEW COMPARISON:  None. FINDINGS: There is no evidence of fracture or dislocation. There is no evidence of arthropathy. Mild posterior calcaneal spurring is noted. No lytic destruction is noted. Soft tissues are unremarkable. IMPRESSION: No acute abnormality is noted. Electronically Signed   By:  Lupita Raider M.D.   On: 10/28/2020 13:52    Procedures Procedures   Medications Ordered in ED Medications  Ampicillin-Sulbactam (UNASYN) 3 g in sodium chloride 0.9 % 100 mL IVPB (has no administration in time range)  morphine 4 MG/ML injection 4 mg (has no administration in time range)  ondansetron (ZOFRAN) injection 4 mg (has no administration in time range)  sodium chloride 0.9 % bolus 1,000 mL (1,000 mLs Intravenous New Bag/Given 10/28/20 1330)    ED Course  I have reviewed the triage vital signs and the nursing notes.  Pertinent labs & imaging results that were available during my care of the patient were reviewed by me and considered in my medical decision making (see chart for details).    MDM Rules/Calculators/A&P                          Patient here for evaluation of wound of her left foot.  History of diabetes without known diabetic neuropathy.  On exam, patient is well-appearing nontoxic.  Afebrile. Patient has obvious diabetic wounds of the left foot including great and second toes.  Palpable dorsalis and posterior tibial pulses.  Doubt sepsis at this time.  Will obtain labs, x-ray and patient will likely need admission.  X-ray of the foot without evidence of osteomyelitis.  Labs interpreted by me, show leukocytosis.  Chemistries unremarkable.  Lactic acid is reassuring.  We will start patient on Zosyn.  She is agreeable to admission.  Discussed findings with [facility] hospitalist, Dr. Toniann Fail who agrees to admit  Final Clinical Impression(s) / ED Diagnoses Final diagnoses:  Wound infection  Cellulitis of left foot    Rx / DC Orders ED Discharge Orders     None        Perian Tedder,  PA-C 10/28/20 1545    Linwood DibblesKnapp, Jon, MD 10/29/20 [phone]

## 2020-10-28 NOTE — H&P (Signed)
History and Physical    Maria Leblanc YCX:[id number] DOB: [birth year] DOA: 10/28/2020  PCP: [facility]  Patient coming from: Home.  Chief Complaint: Left foot wound.  HPI: Maria Leblanc is a 65 y.o. female with history of diabetes mellitus type 2, hypertension, ongoing tobacco abuse presents to the ER because of wound on the left foot which happened over the last 3 days.  Patient states she initially developed a blister which peeled off and developed last ulceration on the medial aspect of the left foot involving the big toe and also slightly involving the second toe.  Denies any fever chills or trauma.  Denies any new footwear.  ED Course: In the ER patient was afebrile x-rays do not show any involvement of bone.  Patient has a large ulcer at least measuring around 9 x 3 cm approximately with darkened toes.  Pulses are good.  Labs show leukocytosis.  COVID test is pending.  Patient started on antibiotics admitted for further observation.  Review of Systems: As per HPI, rest all negative.   Past Medical History:  Diagnosis Date   Ankle fracture 02/18/2016   left   Borderline diabetes    Complication of anesthesia    "fighting with anesthesia when waking up"   Diverticulosis    Headache    History of kidney stones    Hypothyroid    no longer needs med    Past Surgical History:  Procedure Laterality Date   CHOLECYSTECTOMY N/A 03/29/2017   Procedure: LAPAROSCOPIC CHOLECYSTECTOMY;  Surgeon: Lucretia Roers, MD;  Location: [facility];  Service: General;  Laterality: N/A;   CYST REMOVAL NECK     x3   DIAGNOSTIC LAPAROSCOPY     as teenager   INCISION AND DRAINAGE PERIRECTAL ABSCESS  03/29/2017   Procedure: INCISION  AND DRAINAGE PERIRECTAL ABSCESS;  Surgeon: Lucretia Roers, MD;  Location: [facility];  Service: General;;   ORIF ANKLE FRACTURE Left 02/28/2016   Procedure: OPEN REDUCTION INTERNAL FIXATION (ORIF) ANKLE FRACTURE, LEFT;  Surgeon: Sheral Apley, MD;   Location: [facility];  Service: Orthopedics;  Laterality: Left;   TONSILLECTOMY     as child     reports that she has been smoking cigarettes. She has a 10.00 pack-year smoking history. She has never used smokeless tobacco. She reports previous alcohol use. She reports that she does not use drugs.  Allergies  Allergen Reactions   Propofol Other (See Comments)    Patient reports "it causes me to fight them when i'm waking up"    Family History  Problem Relation Age of Onset   Diabetes Mother    Other Mother    Dementia Mother    Cancer Father    Heart attack Father    Diabetes Father    Other Brother     Prior to Admission medications   Medication Sig Start Date End Date Taking? Authorizing Provider  acetaminophen (TYLENOL) 325 MG tablet Take 650 mg by mouth every 6 (six) hours as needed.   Yes [provider]  albuterol (VENTOLIN HFA) 108 (90 Base) MCG/ACT inhaler Inhale 1 puff into the lungs 4 (four) times daily as needed. 08/02/20  Yes [provider]  busPIRone (BUSPAR) 5 MG tablet Take 5 mg by mouth daily.   Yes [provider]  dapagliflozin propanediol (FARXIGA) 10 MG TABS tablet Take 10 mg by mouth daily.   Yes [provider]  glipiZIDE (GLUCOTROL XL) 5 MG 24 hr  tablet Take 5 mg by mouth daily. 08/29/20  Yes [provider]  ibuprofen (ADVIL) 200 MG tablet Take 200 mg by mouth every 6 (six) hours as needed.   Yes [provider]  lisinopril (ZESTRIL) 40 MG tablet Take 40 mg by mouth daily. 09/04/20  Yes [provider]  metFORMIN (GLUCOPHAGE) 1000 MG tablet Take 1,000 mg by mouth 2 (two) times daily. 06/29/19  Yes [provider]  dicyclomine (BENTYL) 10 MG capsule Take one capsule before meals and at bedtime as needed for abdominal pain, loose stool. Patient not taking: No sig reported 07/19/19   Tiffany Kocher, PA-C  oxyCODONE-acetaminophen (PERCOCET) 5-325 MG tablet Take 1 tablet by mouth  every 4 (four) hours as needed for moderate pain. Patient not taking: No sig reported 11/24/19   Dione Booze, MD  oxyCODONE-acetaminophen (PERCOCET) 5-325 MG tablet Take 1 tablet by mouth every 4 (four) hours as needed for moderate pain. Patient not taking: No sig reported 11/24/19   Dione Booze, MD    Physical Exam: Constitutional: Moderately built and nourished. Vitals:   10/28/20 1122 10/28/20 1405 10/28/20 1407  BP: (!) 177/83 (!) 167/76 (!) 167/76  Pulse: 65 66 65  Resp: 17 19 16   Temp: 98.2 F (36.8 C)    TempSrc: Oral    SpO2: 94% 98% 98%   Eyes: Anicteric no pallor. ENMT: No discharge from the ears eyes nose and mouth. Neck: No mass felt.  No neck rigidity. Respiratory: No rhonchi or crepitations. Cardiovascular: S1-S2 heard. Abdomen: Soft nontender bowel sounds present. Musculoskeletal: Left foot has an ulcer with good pulses. Skin: Left foot ulcer measuring around approximately 9 x 3 cm in the medial aspect of the foot. Neurologic: Alert awake oriented time place and person.  Moves all extremities. Psychiatric: Appears normal.  Normal affect.   Labs on Admission: I have personally reviewed following labs and imaging studies  CBC: Recent Labs  Lab 10/28/20 1302  WBC 15.1*  NEUTROABS 12.0*  HGB 15.1*  HCT 47.0*  MCV 94.9  PLT 179   Basic Metabolic Panel: Recent Labs  Lab 10/28/20 1302  NA 139  K 4.3  CL 106  CO2 26  GLUCOSE 94  BUN 24*  CREATININE 0.56  CALCIUM 10.1   GFR: CrCl cannot be calculated (Unknown ideal weight.). Liver Function Tests: No results for input(s): AST, ALT, ALKPHOS, BILITOT, PROT, ALBUMIN in the last 168 hours. No results for input(s): LIPASE, AMYLASE in the last 168 hours. No results for input(s): AMMONIA in the last 168 hours. Coagulation Profile: No results for input(s): INR, PROTIME in the last 168 hours. Cardiac Enzymes: No results for input(s): CKTOTAL, CKMB, CKMBINDEX, TROPONINI in the last 168 hours. BNP (last 3  results) No results for input(s): PROBNP in the last 8760 hours. HbA1C: No results for input(s): HGBA1C in the last 72 hours. CBG: No results for input(s): GLUCAP in the last 168 hours. Lipid Profile: No results for input(s): CHOL, HDL, LDLCALC, TRIG, CHOLHDL, LDLDIRECT in the last 72 hours. Thyroid Function Tests: No results for input(s): TSH, T4TOTAL, FREET4, T3FREE, THYROIDAB in the last 72 hours. Anemia Panel: No results for input(s): VITAMINB12, FOLATE, FERRITIN, TIBC, IRON, RETICCTPCT in the last 72 hours. Urine analysis:    Component Value Date/Time   COLORURINE YELLOW 02/11/2019 0332   APPEARANCEUR CLEAR 02/11/2019 0332   LABSPEC 1.035 (H) 02/11/2019 0332   PHURINE 5.0 02/11/2019 0332   GLUCOSEU >=500 (A) 02/11/2019 0332   HGBUR SMALL (A) 02/11/2019 02/13/2019  BILIRUBINUR NEGATIVE 02/11/2019 0332   KETONESUR NEGATIVE 02/11/2019 0332   PROTEINUR NEGATIVE 02/11/2019 0332   NITRITE NEGATIVE 02/11/2019 0332   LEUKOCYTESUR MODERATE (A) 02/11/2019 0332   Sepsis Labs: @LABRCNTIP (procalcitonin:4,lacticidven:4) )No results found for this or any previous visit (from the past 240 hour(s)).   Radiological Exams on Admission: DG Foot Complete Left  Result Date: 10/28/2020 CLINICAL DATA:  Right foot wound. EXAM: RIGHT FOOT COMPLETE - 3+ VIEW COMPARISON:  None. FINDINGS: There is no evidence of fracture or dislocation. There is no evidence of arthropathy. Mild posterior calcaneal spurring is noted. No lytic destruction is noted. Soft tissues are unremarkable. IMPRESSION: No acute abnormality is noted. Electronically Signed   By: 12/29/2020 M.D.   On: 10/28/2020 13:52      Assessment/Plan Principal Problem:   Cellulitis of foot Active Problems:   Diabetes mellitus type II, non insulin dependent (HCC)   Essential hypertension   Cellulitis    Left foot diabetic foot ulceration with cellulitis we will keep patient on empiric antibiotics and closely monitor.  May consult  orthopedics in the morning.  Will consult wound team. Diabetes mellitus type 2 patient states her last hemoglobin A1c around 9.  We will closely monitor CBG with sliding scale coverage may need long-acting insulin.  Holding oral hypoglycemics while inpatient.  Check hemoglobin A1c. Hypertension uncontrolled on lisinopril.  We will closely monitor blood pressure trends.  As needed IV hydralazine. Tobacco abuse advised about quitting.  COVID test is pending.   DVT prophylaxis: Heparin. Code Status: Full code. Family Communication: Discussed with patient. Disposition Plan: Home. Consults called: Wound team. Admission status: Observation.   12/29/2020 MD [facility] Pager [phone].  If 7PM-7AM, please contact night-coverage [email or web address] Password [email or web address]  10/28/2020, 3:40 PM

## 2020-10-28 NOTE — ED Triage Notes (Signed)
Patient is a diabetic and 3 days ago she has a blister on foot. States the skin has begun to come off and the pain is getting worse

## 2020-10-29 ENCOUNTER — Other Ambulatory Visit: Payer: Self-pay

## 2020-10-29 ENCOUNTER — Observation Stay (HOSPITAL_COMMUNITY): Payer: Self-pay

## 2020-10-29 DIAGNOSIS — L97509 Non-pressure chronic ulcer of other part of unspecified foot with unspecified severity: Secondary | ICD-10-CM

## 2020-10-29 DIAGNOSIS — E13621 Other specified diabetes mellitus with foot ulcer: Secondary | ICD-10-CM

## 2020-10-29 LAB — BASIC METABOLIC PANEL
Anion gap: 5 (ref 5–15)
BUN: 20 mg/dL (ref 8–23)
CO2: 25 mmol/L (ref 22–32)
Calcium: 9.4 mg/dL (ref 8.9–10.3)
Chloride: 109 mmol/L (ref 98–111)
Creatinine, Ser: 0.43 mg/dL — ABNORMAL LOW (ref 0.44–1.00)
GFR, Estimated: >60 mL/min (ref >60)
Glucose, Bld: 91 mg/dL (ref 70–99)
Potassium: 4.2 mmol/L (ref 3.5–5.1)
Sodium: 139 mmol/L (ref 135–145)

## 2020-10-29 LAB — GLUCOSE, CAPILLARY
Glucose-Capillary: 76 mg/dL (ref 70–99)
Glucose-Capillary: 103 mg/dL — ABNORMAL HIGH (ref 70–99)
Glucose-Capillary: 105 mg/dL — ABNORMAL HIGH (ref 70–99)
Glucose-Capillary: 100 mg/dL — ABNORMAL HIGH (ref 70–99)

## 2020-10-29 LAB — CBC
HCT: 40.7 % (ref 36.0–46.0)
Hemoglobin: 13 g/dL (ref 12.0–15.0)
MCH: 30.5 pg (ref 26.0–34.0)
MCHC: 31.9 g/dL (ref 30.0–36.0)
MCV: 95.5 fL (ref 80.0–100.0)
Platelets: 169 10*3/uL (ref 150–400)
RBC: 4.26 MIL/uL (ref 3.87–5.11)
RDW: 13.5 % (ref 11.5–15.5)
WBC: 9.7 10*3/uL (ref 4.0–10.5)
nRBC: 0 % (ref 0.0–0.2)

## 2020-10-29 MED ORDER — AMLODIPINE BESYLATE 5 MG PO TABS
5.0000 mg | ORAL_TABLET | Freq: Every day | ORAL | Status: DC
  Administered 2020-10-29: 5 mg via ORAL
  Filled 2020-10-29 (×2): qty 1

## 2020-10-29 MED ORDER — POLYETHYLENE GLYCOL 3350 17 G PO PACK
17.0000 g | PACK | Freq: Every day | ORAL | Status: DC
  Administered 2020-10-29 – 2020-10-30 (×2): 17 g via ORAL
  Filled 2020-10-29 (×2): qty 1

## 2020-10-29 MED ORDER — COLLAGENASE 250 UNIT/GM EX OINT
TOPICAL_OINTMENT | Freq: Every day | CUTANEOUS | Status: DC
  Filled 2020-10-29: qty 30

## 2020-10-29 NOTE — Progress Notes (Signed)
PROGRESS NOTE    Maria Leblanc  YHC:[id number] DOB: April 24, 1956 DOA: 10/28/2020 PCP: [facility]   Brief Narrative:   Maria Leblanc is a 65 y.o. female with history of diabetes mellitus type 2, hypertension, ongoing tobacco abuse presents to the ER because of wound on the left foot which happened over the last 3 days.  Patient was admitted with a left diabetic foot ulceration with cellulitis.  She has been started empirically on vancomycin and cefepime.  Assessment & Plan:   Principal Problem:   Cellulitis of foot Active Problems:   Diabetes mellitus type II, non insulin dependent (HCC)   Essential hypertension   Cellulitis   Left diabetic foot ulceration with cellulitis -Appreciate wound care and general surgery evaluation -Continue on vancomycin and cefepime empirically -ABI ordered and pending  Type 2 diabetes -Likely with associated neuropathy -Appears uncontrolled with last hemoglobin A1c around 9% -Currently well controlled -Holding oral hypoglycemic agents -Covering with SSI  Hypertension-poorly controlled -Currently on lisinopril 40 mg daily -Plan to add amlodipine 5 mg daily  History of tobacco abuse -Counseled on cessation  DVT prophylaxis: Heparin Code Status: Full  Family Communication: None at bedside Disposition Plan:  Status is: Observation  The patient will require care spanning > 2 midnights and should be moved to inpatient because: IV treatments appropriate due to intensity of illness or inability to take PO  Dispo: The patient is from: Home              Anticipated d/c is to: Home              Patient currently is not medically stable to d/c.   Difficult to place patient No   Consultants:  General surgery  Procedures:  See below  Antimicrobials:  Anti-infectives (From admission, onward)    Start     Dose/Rate Route Frequency Ordered Stop   10/29/20 0400  vancomycin (VANCOREADY) IVPB 750 mg/150 mL        750 mg 150 mL/hr  over 60 Minutes Intravenous Every 12 hours 10/28/20 1627     10/28/20 2200  ceFEPIme (MAXIPIME) 2 g in sodium chloride 0.9 % 100 mL IVPB        2 g 200 mL/hr over 30 Minutes Intravenous Every 8 hours 10/28/20 1627     10/28/20 1630  vancomycin (VANCOREADY) IVPB 1500 mg/300 mL        1,500 mg 150 mL/hr over 120 Minutes Intravenous  Once 10/28/20 1621 10/28/20 2035   10/28/20 1545  vancomycin (VANCOCIN) IVPB 1000 mg/200 mL premix  Status:  Discontinued        1,000 mg 200 mL/hr over 60 Minutes Intravenous  Once 10/28/20 1540 10/28/20 1621   10/28/20 1545  ceFEPIme (MAXIPIME) 2 g in sodium chloride 0.9 % 100 mL IVPB        2 g 200 mL/hr over 30 Minutes Intravenous  Once 10/28/20 1540 10/28/20 1814   10/28/20 1415  Ampicillin-Sulbactam (UNASYN) 3 g in sodium chloride 0.9 % 100 mL IVPB        3 g 200 mL/hr over 30 Minutes Intravenous  Once 10/28/20 1413 10/28/20 1534       Subjective: Patient seen and evaluated today with no new acute complaints or concerns. No acute concerns or events noted overnight.  Objective: Vitals:   10/28/20 1758 10/28/20 2059 10/29/20 0211 10/29/20 0629  BP: 120/71 (!) 167/88 (!) 181/87 (!) 176/86  Pulse: 64 67 67 68  Resp: 18 (!) 22  18 18  Temp:  97.7 F (36.5 C) 98.7 F (37.1 C) 98 F (36.7 C)  TempSrc:   Oral   SpO2: 95% 97% 91% 93%  Weight:      Height:        Intake/Output Summary (Last 24 hours) at 10/29/2020 1022 Last data filed at 10/29/2020 0700 Gross per 24 hour  Intake 2683.12 ml  Output --  Net 2683.12 ml   Filed Weights   10/28/20 1546  Weight: 74.8 kg    Examination:  General exam: Appears calm and comfortable  Respiratory system: Clear to auscultation. Respiratory effort normal. Cardiovascular system: S1 & S2 heard, RRR.  Gastrointestinal system: Abdomen is soft Central nervous system: Alert and awake Extremities: No edema Skin: Left foot wound as noted in chart.  Erythematous with no discharge. Psychiatry: Flat  affect.    Data Reviewed: I have personally reviewed following labs and imaging studies  CBC: Recent Labs  Lab 10/28/20 1302 10/29/20 0424  WBC 15.1* 9.7  NEUTROABS 12.0*  --   HGB 15.1* 13.0  HCT 47.0* 40.7  MCV 94.9 95.5  PLT 179 169   Basic Metabolic Panel: Recent Labs  Lab 10/28/20 1302 10/29/20 0424  NA 139 139  K 4.3 4.2  CL 106 109  CO2 26 25  GLUCOSE 94 91  BUN 24* 20  CREATININE 0.56 0.43*  CALCIUM 10.1 9.4   GFR: Estimated Creatinine Clearance: 71.9 mL/min (A) (by C-G formula based on SCr of 0.43 mg/dL (L)). Liver Function Tests: No results for input(s): AST, ALT, ALKPHOS, BILITOT, PROT, ALBUMIN in the last 168 hours. No results for input(s): LIPASE, AMYLASE in the last 168 hours. No results for input(s): AMMONIA in the last 168 hours. Coagulation Profile: No results for input(s): INR, PROTIME in the last 168 hours. Cardiac Enzymes: No results for input(s): CKTOTAL, CKMB, CKMBINDEX, TROPONINI in the last 168 hours. BNP (last 3 results) No results for input(s): PROBNP in the last 8760 hours. HbA1C: No results for input(s): HGBA1C in the last 72 hours. CBG: Recent Labs  Lab 10/28/20 1814 10/28/20 2102 10/29/20 0731  GLUCAP 99 129* 76   Lipid Profile: No results for input(s): CHOL, HDL, LDLCALC, TRIG, CHOLHDL, LDLDIRECT in the last 72 hours. Thyroid Function Tests: No results for input(s): TSH, T4TOTAL, FREET4, T3FREE, THYROIDAB in the last 72 hours. Anemia Panel: No results for input(s): VITAMINB12, FOLATE, FERRITIN, TIBC, IRON, RETICCTPCT in the last 72 hours. Sepsis Labs: Recent Labs  Lab 10/28/20 1302 10/28/20 1450  LATICACIDVEN 1.3 1.0    Recent Results (from the past 240 hour(s))  Resp Panel by RT-PCR (Flu A&B, Covid) Nasopharyngeal Swab     Status: None   Collection Time: 10/28/20  3:14 PM   Specimen: Nasopharyngeal Swab; Nasopharyngeal(NP) swabs in vial transport medium  Result Value Ref Range Status   SARS Coronavirus 2 by RT  PCR NEGATIVE NEGATIVE Final    Comment: (NOTE) SARS-CoV-2 target nucleic acids are NOT DETECTED.  The SARS-CoV-2 RNA is generally detectable in upper respiratory specimens during the acute phase of infection. The lowest concentration of SARS-CoV-2 viral copies this assay can detect is 138 copies/mL. A negative result does not preclude SARS-Cov-2 infection and should not be used as the sole basis for treatment or other patient management decisions. A negative result may occur with  improper specimen collection/handling, submission of specimen other than nasopharyngeal swab, presence of viral mutation(s) within the areas targeted by this assay, and inadequate number of viral copies(<138 copies/mL). A negative  result must be combined with clinical observations, patient history, and epidemiological information. The expected result is Negative.  Fact Sheet for Patients:  BloggerCourse.com  Fact Sheet for Healthcare Providers:  SeriousBroker.it  This test is no t yet approved or cleared by the [place] FDA and  has been authorized for detection and/or diagnosis of SARS-CoV-2 by FDA under an Emergency Use Authorization (EUA). This EUA will remain  in effect (meaning this test can be used) for the duration of the COVID-19 declaration under Section 564(b)(1) of the Act, 21 U.S.C.section 360bbb-3(b)(1), unless the authorization is terminated  or revoked sooner.       Influenza A by PCR NEGATIVE NEGATIVE Final   Influenza B by PCR NEGATIVE NEGATIVE Final    Comment: (NOTE) The Xpert Xpress SARS-CoV-2/FLU/RSV plus assay is intended as an aid in the diagnosis of influenza from Nasopharyngeal swab specimens and should not be used as a sole basis for treatment. Nasal washings and aspirates are unacceptable for Xpert Xpress SARS-CoV-2/FLU/RSV testing.  Fact Sheet for Patients: BloggerCourse.com  Fact Sheet for  Healthcare Providers: SeriousBroker.it  This test is not yet approved or cleared by the [place] FDA and has been authorized for detection and/or diagnosis of SARS-CoV-2 by FDA under an Emergency Use Authorization (EUA). This EUA will remain in effect (meaning this test can be used) for the duration of the COVID-19 declaration under Section 564(b)(1) of the Act, 21 U.S.C. section 360bbb-3(b)(1), unless the authorization is terminated or revoked.  Performed at [facility], [address] [place], [place] [address]          Radiology Studies: DG Foot Complete Left  Result Date: 10/28/2020 CLINICAL DATA:  Right foot wound. EXAM: RIGHT FOOT COMPLETE - 3+ VIEW COMPARISON:  None. FINDINGS: There is no evidence of fracture or dislocation. There is no evidence of arthropathy. Mild posterior calcaneal spurring is noted. No lytic destruction is noted. Soft tissues are unremarkable. IMPRESSION: No acute abnormality is noted. Electronically Signed   By: Lupita Raider M.D.   On: 10/28/2020 13:52        Scheduled Meds:  busPIRone  5 mg Oral Daily   heparin  5,000 Units Subcutaneous Q8H   insulin aspart  0-9 Units Subcutaneous TID WC   lisinopril  40 mg Oral Daily   Continuous Infusions:  sodium chloride 75 mL/hr at 10/28/20 1617   ceFEPime (MAXIPIME) IV 2 g (10/29/20 0719)   vancomycin 750 mg (10/29/20 0552)     LOS: 0 days    Time spent: 35 minutes    Nastassja Witkop D Sherryll Burger, DO [facility]  If 7PM-7AM, please contact night-coverage [email or web address] 10/29/2020, 10:22 AM

## 2020-10-29 NOTE — Consult Note (Addendum)
WOC Nurse wound consult note Consultation was completed by review of records, images and assistance from the bedside nurse/clinical staff.   Reason for Consult: left foot ulcer Reviewed notes, patient self reports blister that ruptured with increasing pain in the presence of DM and elevated BS. Xray negative  Wound type: Neuropathic ulcerations left great toe and left second toe  Pressure Injury POA: NA Measurement: see nursing flow sheets Wound bed: Great toe; 90% solft brown eschar/10% light pink circumferential wound bed edges. Some skin changes around the toe nail of the left great toe as well. Left second toe; 100% pink but with darkening of the distal lateral aspect of the 2nd toe Drainage (amount, consistency, odor) unable to assess from images, MD notes report none Periwound: erythema of the great toe and 2nd toe Moisture noted in the web spaces of the toes including 3rd and 4th toes Dressing procedure/placement/frequency:  Will order conservative topical care for now, appears she may need debridement but until vascular status can be determined would not add enzymatic debridement.  ABIs ordered.  Add topical silvadene for now to serve as antimicrobial and dry dressing until possible surgical consultation.   Consider orthopedic or podiatry consultation, she will definitely need follow up and care for these toes long term in the presence of uncontrolled DM.  Will need offloading as well to provide any opportunity for healing    Re consult if needed, will not follow at this time. Thanks  Waleska Buttery [employer], RN,CWOCN, CNS, CWON-AP [phone] noted general surgery saw this patient; conversation via [place] on POC will update orders based on outcome of this conversation.
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
## 2020-10-30 DIAGNOSIS — L03119 Cellulitis of unspecified part of limb: Secondary | ICD-10-CM

## 2020-10-30 LAB — GLUCOSE, CAPILLARY
Glucose-Capillary: 129 mg/dL — ABNORMAL HIGH (ref 70–99)
Glucose-Capillary: 102 mg/dL — ABNORMAL HIGH (ref 70–99)
Glucose-Capillary: 172 mg/dL — ABNORMAL HIGH (ref 70–99)

## 2020-10-30 MED ORDER — AMOXICILLIN-POT CLAVULANATE 875-125 MG PO TABS: 1.0000 | ORAL_TABLET | Freq: Two times a day (BID) | ORAL | 0 refills | Status: AC

## 2020-10-30 MED ORDER — AMLODIPINE BESYLATE 5 MG PO TABS
10.0000 mg | ORAL_TABLET | Freq: Every day | ORAL | Status: DC
  Administered 2020-10-30: 10 mg via ORAL
  Filled 2020-10-30: qty 2

## 2020-10-30 MED ORDER — SILVER SULFADIAZINE 1 % EX CREA: 1.0000 "application " | TOPICAL_CREAM | Freq: Two times a day (BID) | CUTANEOUS | 0 refills | Status: DC

## 2020-10-30 MED ORDER — OXYCODONE HCL 5 MG PO TABS
5.0000 mg | ORAL_TABLET | Freq: Once | ORAL | Status: AC
  Administered 2020-10-30: 5 mg via ORAL
  Filled 2020-10-30: qty 1

## 2020-10-30 MED ORDER — SILVER SULFADIAZINE 1 % EX CREA
1.0000 "application " | TOPICAL_CREAM | Freq: Two times a day (BID) | CUTANEOUS | Status: DC
  Administered 2020-10-30 (×2): 1 via TOPICAL
  Filled 2020-10-30: qty 85

## 2020-10-30 MED ORDER — AMLODIPINE BESYLATE 10 MG PO TABS: 10.0000 mg | ORAL_TABLET | Freq: Every day | ORAL | 1 refills | Status: AC

## 2020-10-30 NOTE — Progress Notes (Signed)
[facility] Progress Note     Subjective: Maria Leblanc is a 65 year old female with a past medical history of type II diabetes and hypertension here with a left foot ulcer. She says she feels well this morning and that her condition has not changed much since yesterday. She says her foot appears more swollen and less red on exam, and the aching pain is getting progressively worse.  Overnight, the pain did lead to blood pressure elevation, but the blood pressure has normalized this morning. She also had an "awful" episode of nausea and vomiting. She said the appearance was white and foamy. The nurses report that this may have been due to consumption of a sandwich she hate 1.5 hours prior because she had not eaten much. She was given an unspecified medication for nausea and 1 dose of oxycodone for pain. She was given a heat pack as well because her ankle felt tight and was encouraged to elevate her leg. She says that the rest overnight has helped her feel a lot better.  She is concerned about the pain she will experience during her upcoming wound cleaning but knows it is necessary.  Objective: Vital signs in last 24 hours: Temp:  [97.8 F (36.6 C)-98.6 F (37 C)] 98.3 F (36.8 C) (07/06 0529) Pulse Rate:  [57-70] 57 (07/06 0529) Resp:  [18-20] 19 (07/06 0529) BP: (149-189)/(70-83) 187/82 (07/06 0529) SpO2:  [91 %-100 %] 100 % (07/06 0529) Last BM Date: 10/27/20  Intake/Output from previous day: 07/05 0701 - 07/06 0700 In: 2984.2 [P.O.:680; I.V.:1604.1; IV Piggyback:700.1] Out: -  Intake/Output this shift: No intake/output data recorded.  General appearance: alert, cooperative, and no distress Head: Normocephalic, without obvious abnormality, atraumatic Resp: no difficulty breathing Chest wall: no tenderness Cardio: regular rate and rhythm Skin: Left toe ulceration and redness of big toe and second toe Incision/Wound: Wound appearance is unchanged   Lab Results:   Recent Labs    10/28/20 1302 10/29/20 0424  WBC 15.1* 9.7  HGB 15.1* 13.0  HCT 47.0* 40.7  PLT 179 169   BMET Recent Labs    10/28/20 1302 10/29/20 0424  NA 139 139  K 4.3 4.2  CL 106 109  CO2 26 25  GLUCOSE 94 91  BUN 24* 20  CREATININE 0.56 0.43*  CALCIUM 10.1 9.4   Blood Sugar Her blood sugar was also elevated to 129 at 1:50am this morning, but it was 102 at 7:15am.  PT/INR No results for input(s): LABPROT, INR in the last 72 hours.  Studies/Results: US ARTERIAL ABI (SCREENING LOWER EXTREMITY)  Result Date: 10/29/2020 CLINICAL DATA:  65 year old female with foot ulcer, history of diabetes. EXAM: NONINVASIVE PHYSIOLOGIC VASCULAR STUDY OF BILATERAL LOWER EXTREMITIES TECHNIQUE: Evaluation of both lower extremities were performed at rest, including calculation of ankle-brachial indices with single level Doppler, pressure and pulse volume recording. COMPARISON:  None. FINDINGS: Right ABI:  1.17 Left ABI:  1.27 Right Lower Extremity:  Normal arterial waveforms at the ankle. Left Lower Extremity:  Normal arterial waveforms at the ankle. IMPRESSION: Normal ankle-brachial indices. Marliss Coots, MD [facility] Electronically Signed   By: Marliss Coots MD   On: 10/29/2020 12:38   DG Foot Complete Left  Result Date: 10/28/2020 CLINICAL DATA:  Right foot wound. EXAM: RIGHT FOOT COMPLETE - 3+ VIEW COMPARISON:  None. FINDINGS: There is no evidence of fracture or dislocation. There is no evidence of arthropathy. Mild posterior calcaneal spurring is noted. No lytic  destruction is noted. Soft tissues are unremarkable. IMPRESSION: No acute abnormality is noted. Electronically Signed   By: Lupita Raider M.D.   On: 10/28/2020 13:52    Anti-infectives: Anti-infectives (From admission, onward)    Start     Dose/Rate Route Frequency Ordered Stop   10/29/20 0400  vancomycin (VANCOREADY) IVPB 750 mg/150 mL        750 mg 150 mL/hr over  60 Minutes Intravenous Every 12 hours 10/28/20 1627     10/28/20 2200  ceFEPIme (MAXIPIME) 2 g in sodium chloride 0.9 % 100 mL IVPB        2 g 200 mL/hr over 30 Minutes Intravenous Every 8 hours 10/28/20 1627     10/28/20 1630  vancomycin (VANCOREADY) IVPB 1500 mg/300 mL        1,500 mg 150 mL/hr over 120 Minutes Intravenous  Once 10/28/20 1621 10/28/20 2035   10/28/20 1545  vancomycin (VANCOCIN) IVPB 1000 mg/200 mL premix  Status:  Discontinued        1,000 mg 200 mL/hr over 60 Minutes Intravenous  Once 10/28/20 1540 10/28/20 1621   10/28/20 1545  ceFEPIme (MAXIPIME) 2 g in sodium chloride 0.9 % 100 mL IVPB        2 g 200 mL/hr over 30 Minutes Intravenous  Once 10/28/20 1540 10/28/20 1814   10/28/20 1415  Ampicillin-Sulbactam (UNASYN) 3 g in sodium chloride 0.9 % 100 mL IVPB        3 g 200 mL/hr over 30 Minutes Intravenous  Once 10/28/20 1413 10/28/20 1534       Assessment/Plan: s/p  Ms. Badger is a 65 year old female with a past medical history of type II diabetes and hypertension who has been hospitalized for a left foot ulcer.  LOS: 1 day   The left foot seems stable, and no surgery is required at this time. Medicine for pain should be continued PRN, but if the pain continues to worsen, further evaluation may be necessary.   Antibiotics should be continued. Dr. Lovell Sheehan, MD has ordered Silvadene cream to prevent infection and application should begin today. She should continue elevating her left leg when possible. Blood sugar and blood pressure should continue being closely monitored.   Altamease Oiler 10/30/2020

## 2020-10-30 NOTE — TOC Transition Note (Signed)
Transition of Care [facility]) - CM/SW Discharge Note   Patient Details  Name: Maria Leblanc MRN: [id number] Date of Birth: February 06, 1956  [facility]) CM/SW Contact:  Elliot Gault, LCSW Phone Number: 10/30/2020, 11:25 AM   Clinical Narrative:     Pt stable for dc today per MD. Sherron Monday with pt about dc medications as pt does not have insurance. Per pt, she receives care at [facility]. She states she obtains her medications at [facility]. Reviewed new medications and costs with patient who reported that she will be able to afford them.   Pt did not identify any other TOC needs for dc.  Expected Discharge Plan: Home/Self Care Barriers to Discharge: Barriers Resolved   Patient Goals and CMS Choice        Expected Discharge Plan and Services Expected Discharge Plan: Home/Self Care       Living arrangements for the past 2 months: Single Family Home Expected Discharge Date: 10/30/20                                    Prior Living Arrangements/Services Living arrangements for the past 2 months: Single Family Home Lives with:: Self Patient language and need for interpreter reviewed:: Yes Do you feel safe going back to the place where you live?: Yes      Need for Family Participation in Patient Care: No (Comment) Care giver support system in place?: No (comment)   Criminal Activity/Legal Involvement Pertinent to Current Situation/Hospitalization: No - Comment as needed  Activities of Daily Living Home Assistive Devices/Equipment: None ADL Screening (condition at time of admission) Patient's cognitive ability adequate to safely complete daily activities?: Yes Is the patient deaf or have difficulty hearing?: No Does the patient have difficulty seeing, even when wearing glasses/contacts?: No Does the patient have difficulty concentrating, remembering, or making decisions?: No Patient able to express need for assistance with ADLs?: Yes Does the patient have  difficulty dressing or bathing?: No Independently performs ADLs?: Yes (appropriate for developmental age) Does the patient have difficulty walking or climbing stairs?: No Weakness of Legs: None Weakness of Arms/Hands: None  Permission Sought/Granted                  Emotional Assessment   Attitude/Demeanor/Rapport: Engaged Affect (typically observed): Pleasant Orientation: : Oriented to Self, Oriented to Place, Oriented to  Time, Oriented to Situation Alcohol / Substance Use: Not Applicable Psych Involvement: No (comment)  Admission diagnosis:  Cellulitis [L03.90] Cellulitis of foot [L03.119] Wound infection [T14.8XXA, L08.9] Cellulitis of left foot [L03.116] Patient Active Problem List   Diagnosis Date Noted   Foot ulcer due to secondary DM (HCC)    Cellulitis of foot 10/28/2020   Essential hypertension 10/28/2020   Cellulitis 10/28/2020   LLQ pain 07/19/2019   N&V (nausea and vomiting) 07/19/2019   Diarrhea 07/19/2019   Perirectal abscess    Calculus of gallbladder with acute cholecystitis without obstruction    Sepsis (HCC) 03/27/2017   RUQ pain 03/27/2017   Hyponatremia 03/27/2017   Thrombocytopenia (HCC) 03/27/2017   Diabetes mellitus type II, non insulin dependent (HCC) 03/27/2017   Adrenal nodule (HCC) 03/27/2017   PCP:  Medicine, [facility] Pharmacy:   [facility] [address] [place] [address] [place] [address] Phone: [phone] Fax: [phone]     Social Determinants of Health (SDOH) Interventions    Readmission  Risk Interventions No flowsheet data found.   Final next level of care: Home/Self Care Barriers to Discharge: Barriers Resolved   Patient Goals and CMS Choice        Discharge Placement                       Discharge Plan and Services                                     Social Determinants of Health (SDOH) Interventions     Readmission Risk  Interventions No flowsheet data found.

## 2020-10-30 NOTE — Progress Notes (Signed)
Discussed with patient and Dr. Sherryll Burger impending discharge.  She should keep the wound clean with soap and water daily then apply the Silvadene cream.  We will continue oral Augmentin for 1 week.  I will see her in my office next week for wound care.

## 2020-10-30 NOTE — Plan of Care (Signed)

## 2020-10-30 NOTE — Discharge Summary (Signed)
Physician Discharge Summary  Maria ChurchSarah J Leblanc WUJ:[id number] DOB: [birth year] DOA: 10/28/2020  PCP: [facility]  Admit date: 10/28/2020  Discharge date: 10/30/2020  Admitted From:Home  Disposition:  Home  Recommendations for Outpatient Follow-up:  Follow up with PCP in 1-2 weeks Follow-up with Dr. Lovell SheehanJenkins with general surgery to reevaluate wound on 7/14 Continue daily washing of wound with soap and water and apply Silvadene cream as prescribed Continue Augmentin for 7 more days as prescribed to complete course of treatment for cellulitis Continue other home medications as prior  Home Health: None  Equipment/Devices: None  Discharge Condition:Stable  CODE STATUS: Full  Diet recommendation: Heart Healthy/carb modified  Brief/Interim Summary:  Maria ChurchSarah J Leblanc is a 65 y.o. female with history of diabetes mellitus type 2, hypertension, ongoing tobacco abuse presents to the ER because of wound on the left foot which happened over the last 3 days.  Patient was admitted with a left diabetic foot ulceration with cellulitis.  She had been started empirically on vancomycin and cefepime.  Blood cultures have demonstrated no growth in the last 2 days and her infection appears to be improving.  Recommendations at this time are to continue with Silvadene cream daily with close monitoring per general surgery.  No need for debridement noted.  Plans are for follow-up in the outpatient setting in 1 week and to continue on Augmentin as prescribed.  She is stable for discharge.   Discharge Diagnoses:  Principal Problem:   Cellulitis of foot Active Problems:   Diabetes mellitus type II, non insulin dependent (HCC)   Essential hypertension   Cellulitis   Foot ulcer due to secondary DM (HCC)  Principal discharge diagnosis: Left foot diabetic wound with cellulitis.  Discharge Instructions  Discharge Instructions     Diet - low sodium heart healthy   Complete by: As directed    Increase  activity slowly   Complete by: As directed       Allergies as of 10/30/2020       Reactions   Propofol Other (See Comments)   Patient reports "it causes me to fight them when i'm waking up"        Medication List     STOP taking these medications    oxyCODONE-acetaminophen 5-325 MG tablet Commonly known as: Percocet       TAKE these medications    acetaminophen 325 MG tablet Commonly known as: TYLENOL Take 650 mg by mouth every 6 (six) hours as needed.   albuterol 108 (90 Base) MCG/ACT inhaler Commonly known as: VENTOLIN HFA Inhale 1 puff into the lungs 4 (four) times daily as needed.   amLODipine 10 MG tablet Commonly known as: NORVASC Take 1 tablet (10 mg total) by mouth daily. Start taking on: October 31, 2020   amoxicillin-clavulanate 875-125 MG tablet Commonly known as: Augmentin Take 1 tablet by mouth 2 (two) times daily for 7 days.   busPIRone 5 MG tablet Commonly known as: BUSPAR Take 5 mg by mouth daily.   dapagliflozin propanediol 10 MG Tabs tablet Commonly known as: FARXIGA Take 10 mg by mouth daily.   dicyclomine 10 MG capsule Commonly known as: BENTYL Take one capsule before meals and at bedtime as needed for abdominal pain, loose stool.   glipiZIDE 5 MG 24 hr tablet Commonly known as: GLUCOTROL XL Take 5 mg by mouth daily.   ibuprofen 200 MG tablet Commonly known as: ADVIL Take 200 mg by mouth every 6 (six) hours as needed.   lisinopril 40  MG tablet Commonly known as: ZESTRIL Take 40 mg by mouth daily.   metFORMIN 1000 MG tablet Commonly known as: GLUCOPHAGE Take 1,000 mg by mouth 2 (two) times daily.   silver sulfADIAZINE 1 % cream Commonly known as: SILVADENE Apply 1 application topically 2 (two) times daily.        Follow-up Information     Franky Macho, MD. Schedule an appointment as soon as possible for a visit on 11/07/2020.   Specialty: General Surgery Contact information: [address] [place]  [address] [phone]         Medicine, [facility]. Schedule an appointment as soon as possible for a visit in 1 week(s).   Specialty: Internal Medicine Contact information: [address] [place] [address] [phone]                Allergies  Allergen Reactions   Propofol Other (See Comments)    Patient reports "it causes me to fight them when i'm waking up"    Consultations: General surgery   Procedures/Studies: US ARTERIAL ABI (SCREENING LOWER EXTREMITY)  Result Date: 10/29/2020 CLINICAL DATA:  65 year old female with foot ulcer, history of diabetes. EXAM: NONINVASIVE PHYSIOLOGIC VASCULAR STUDY OF BILATERAL LOWER EXTREMITIES TECHNIQUE: Evaluation of both lower extremities were performed at rest, including calculation of ankle-brachial indices with single level Doppler, pressure and pulse volume recording. COMPARISON:  None. FINDINGS: Right ABI:  1.17 Left ABI:  1.27 Right Lower Extremity:  Normal arterial waveforms at the ankle. Left Lower Extremity:  Normal arterial waveforms at the ankle. IMPRESSION: Normal ankle-brachial indices. Marliss Coots, MD [facility] Electronically Signed   By: Marliss Coots MD   On: 10/29/2020 12:38   DG Foot Complete Left  Result Date: 10/28/2020 CLINICAL DATA:  Right foot wound. EXAM: RIGHT FOOT COMPLETE - 3+ VIEW COMPARISON:  None. FINDINGS: There is no evidence of fracture or dislocation. There is no evidence of arthropathy. Mild posterior calcaneal spurring is noted. No lytic destruction is noted. Soft tissues are unremarkable. IMPRESSION: No acute abnormality is noted. Electronically Signed   By: Lupita Raider M.D.   On: 10/28/2020 13:52     Discharge Exam: Vitals:   10/30/20 0154 10/30/20 0529  BP: (!) 189/83 (!) 187/82  Pulse: 61 (!) 57  Resp: 20 19  Temp: 98.6 F (37 C) 98.3 F (36.8 C)  SpO2: 95% 100%   Vitals:   10/29/20 1044 10/29/20 1428 10/30/20 0154 10/30/20  0529  BP: (!) 149/70 (!) 160/78 (!) 189/83 (!) 187/82  Pulse: 70 65 61 (!) 57  Resp: 20 18 20 19   Temp: 98.6 F (37 C) 97.8 F (36.6 C) 98.6 F (37 C) 98.3 F (36.8 C)  TempSrc:  Oral Oral Oral  SpO2: 92% 91% 95% 100%  Weight:      Height:        General: Pt is alert, awake, not in acute distress Cardiovascular: RRR, S1/S2 +, no rubs, no gallops Respiratory: CTA bilaterally, no wheezing, no rhonchi Abdominal: Soft, NT, ND, bowel sounds + Extremities: no edema, no cyanosis, left foot wound clean dry and intact with no drainage.    The results of significant diagnostics from this hospitalization (including imaging, microbiology, ancillary and laboratory) are listed below for reference.     Microbiology: Recent Results (from the past 240 hour(s))  Culture, blood (routine x 2)     Status: None (Preliminary result)   Collection Time: 10/28/20  2:50 PM   Specimen:  BLOOD  Result Value Ref Range Status   Specimen Description BLOOD  Final   Special Requests NONE  Final   Culture   Final    NO GROWTH 2 DAYS Performed at [facility], [address], [place], [place] [address]    Report Status PENDING  Incomplete  Culture, blood (routine x 2)     Status: None (Preliminary result)   Collection Time: 10/28/20  2:50 PM   Specimen: BLOOD  Result Value Ref Range Status   Specimen Description BLOOD  Final   Special Requests NONE  Final   Culture   Final    NO GROWTH 2 DAYS Performed at [facility], [address], [place], [place] [address]    Report Status PENDING  Incomplete  Resp Panel by RT-PCR (Flu A&B, Covid) Nasopharyngeal Swab     Status: None   Collection Time: 10/28/20  3:14 PM   Specimen: Nasopharyngeal Swab; Nasopharyngeal(NP) swabs in vial transport medium  Result Value Ref Range Status   SARS Coronavirus 2 by RT PCR NEGATIVE NEGATIVE Final    Comment: (NOTE) SARS-CoV-2 target nucleic acids are NOT DETECTED.  The SARS-CoV-2 RNA is generally detectable in upper  respiratory specimens during the acute phase of infection. The lowest concentration of SARS-CoV-2 viral copies this assay can detect is 138 copies/mL. A negative result does not preclude SARS-Cov-2 infection and should not be used as the sole basis for treatment or other patient management decisions. A negative result may occur with  improper specimen collection/handling, submission of specimen other than nasopharyngeal swab, presence of viral mutation(s) within the areas targeted by this assay, and inadequate number of viral copies(<138 copies/mL). A negative result must be combined with clinical observations, patient history, and epidemiological information. The expected result is Negative.  Fact Sheet for Patients:  BloggerCourse.com  Fact Sheet for Healthcare Providers:  SeriousBroker.it  This test is no t yet approved or cleared by the [place] FDA and  has been authorized for detection and/or diagnosis of SARS-CoV-2 by FDA under an Emergency Use Authorization (EUA). This EUA will remain  in effect (meaning this test can be used) for the duration of the COVID-19 declaration under Section 564(b)(1) of the Act, 21 U.S.C.section 360bbb-3(b)(1), unless the authorization is terminated  or revoked sooner.       Influenza A by PCR NEGATIVE NEGATIVE Final   Influenza B by PCR NEGATIVE NEGATIVE Final    Comment: (NOTE) The Xpert Xpress SARS-CoV-2/FLU/RSV plus assay is intended as an aid in the diagnosis of influenza from Nasopharyngeal swab specimens and should not be used as a sole basis for treatment. Nasal washings and aspirates are unacceptable for Xpert Xpress SARS-CoV-2/FLU/RSV testing.  Fact Sheet for Patients: BloggerCourse.com  Fact Sheet for Healthcare Providers: SeriousBroker.it  This test is not yet approved or cleared by the [place] FDA and has been  authorized for detection and/or diagnosis of SARS-CoV-2 by FDA under an Emergency Use Authorization (EUA). This EUA will remain in effect (meaning this test can be used) for the duration of the COVID-19 declaration under Section 564(b)(1) of the Act, 21 U.S.C. section 360bbb-3(b)(1), unless the authorization is terminated or revoked.  Performed at [facility], [address] [place], [place] [address]      Labs: BNP (last 3 results) No results for input(s): BNP in the last 8760 hours. Basic Metabolic Panel: Recent Labs  Lab 10/28/20 1302 10/29/20 0424  NA 139 139  K 4.3 4.2  CL 106 109  CO2  26 25  GLUCOSE 94 91  BUN 24* 20  CREATININE 0.56 0.43*  CALCIUM 10.1 9.4   Liver Function Tests: No results for input(s): AST, ALT, ALKPHOS, BILITOT, PROT, ALBUMIN in the last 168 hours. No results for input(s): LIPASE, AMYLASE in the last 168 hours. No results for input(s): AMMONIA in the last 168 hours. CBC: Recent Labs  Lab 10/28/20 1302 10/29/20 0424  WBC 15.1* 9.7  NEUTROABS 12.0*  --   HGB 15.1* 13.0  HCT 47.0* 40.7  MCV 94.9 95.5  PLT 179 169   Cardiac Enzymes: No results for input(s): CKTOTAL, CKMB, CKMBINDEX, TROPONINI in the last 168 hours. BNP: Invalid input(s): POCBNP CBG: Recent Labs  Lab 10/29/20 1109 10/29/20 1625 10/29/20 2138 10/30/20 0150 10/30/20 0715  GLUCAP 103* 105* 100* 129* 102*   D-Dimer No results for input(s): DDIMER in the last 72 hours. Hgb A1c No results for input(s): HGBA1C in the last 72 hours. Lipid Profile No results for input(s): CHOL, HDL, LDLCALC, TRIG, CHOLHDL, LDLDIRECT in the last 72 hours. Thyroid function studies No results for input(s): TSH, T4TOTAL, T3FREE, THYROIDAB in the last 72 hours.  Invalid input(s): FREET3 Anemia work up No results for input(s): VITAMINB12, FOLATE, FERRITIN, TIBC, IRON, RETICCTPCT in the last 72 hours. Urinalysis    Component Value Date/Time   COLORURINE YELLOW 02/11/2019 0332    APPEARANCEUR CLEAR 02/11/2019 0332   LABSPEC 1.035 (H) 02/11/2019 0332   PHURINE 5.0 02/11/2019 0332   GLUCOSEU >=500 (A) 02/11/2019 0332   HGBUR SMALL (A) 02/11/2019 0332   BILIRUBINUR NEGATIVE 02/11/2019 0332   KETONESUR NEGATIVE 02/11/2019 0332   PROTEINUR NEGATIVE 02/11/2019 0332   NITRITE NEGATIVE 02/11/2019 0332   LEUKOCYTESUR MODERATE (A) 02/11/2019 0332   Sepsis Labs Invalid input(s): PROCALCITONIN,  WBC,  LACTICIDVEN Microbiology Recent Results (from the past 240 hour(s))  Culture, blood (routine x 2)     Status: None (Preliminary result)   Collection Time: 10/28/20  2:50 PM   Specimen: BLOOD  Result Value Ref Range Status   Specimen Description BLOOD  Final   Special Requests NONE  Final   Culture   Final    NO GROWTH 2 DAYS Performed at [facility], [address], [place], [place] [address]    Report Status PENDING  Incomplete  Culture, blood (routine x 2)     Status: None (Preliminary result)   Collection Time: 10/28/20  2:50 PM   Specimen: BLOOD  Result Value Ref Range Status   Specimen Description BLOOD  Final   Special Requests NONE  Final   Culture   Final    NO GROWTH 2 DAYS Performed at [facility], [address], [place], [place] [address]    Report Status PENDING  Incomplete  Resp Panel by RT-PCR (Flu A&B, Covid) Nasopharyngeal Swab     Status: None   Collection Time: 10/28/20  3:14 PM   Specimen: Nasopharyngeal Swab; Nasopharyngeal(NP) swabs in vial transport medium  Result Value Ref Range Status   SARS Coronavirus 2 by RT PCR NEGATIVE NEGATIVE Final    Comment: (NOTE) SARS-CoV-2 target nucleic acids are NOT DETECTED.  The SARS-CoV-2 RNA is generally detectable in upper respiratory specimens during the acute phase of infection. The lowest concentration of SARS-CoV-2 viral copies this assay can detect is 138 copies/mL. A negative result does not preclude SARS-Cov-2 infection and should not be used as the sole basis for treatment or other patient  management decisions. A negative result may occur with  improper specimen collection/handling,  submission of specimen other than nasopharyngeal swab, presence of viral mutation(s) within the areas targeted by this assay, and inadequate number of viral copies(<138 copies/mL). A negative result must be combined with clinical observations, patient history, and epidemiological information. The expected result is Negative.  Fact Sheet for Patients:  BloggerCourse.com  Fact Sheet for Healthcare Providers:  SeriousBroker.it  This test is no t yet approved or cleared by the [place] FDA and  has been authorized for detection and/or diagnosis of SARS-CoV-2 by FDA under an Emergency Use Authorization (EUA). This EUA will remain  in effect (meaning this test can be used) for the duration of the COVID-19 declaration under Section 564(b)(1) of the Act, 21 U.S.C.section 360bbb-3(b)(1), unless the authorization is terminated  or revoked sooner.       Influenza A by PCR NEGATIVE NEGATIVE Final   Influenza B by PCR NEGATIVE NEGATIVE Final    Comment: (NOTE) The Xpert Xpress SARS-CoV-2/FLU/RSV plus assay is intended as an aid in the diagnosis of influenza from Nasopharyngeal swab specimens and should not be used as a sole basis for treatment. Nasal washings and aspirates are unacceptable for Xpert Xpress SARS-CoV-2/FLU/RSV testing.  Fact Sheet for Patients: BloggerCourse.com  Fact Sheet for Healthcare Providers: SeriousBroker.it  This test is not yet approved or cleared by the [place] FDA and has been authorized for detection and/or diagnosis of SARS-CoV-2 by FDA under an Emergency Use Authorization (EUA). This EUA will remain in effect (meaning this test can be used) for the duration of the COVID-19 declaration under Section 564(b)(1) of the Act, 21 U.S.C. section 360bbb-3(b)(1),  unless the authorization is terminated or revoked.  Performed at [facility], [address] [place], [place] [address]      Time coordinating discharge: 35 minutes  SIGNED:   Erick Blinks, DO [facility] 10/30/2020, 10:34 AM  If 7PM-7AM, please contact night-coverage [email or web address]

## 2020-10-30 NOTE — Discharge Instructions (Signed)
Clean foot with soap and water daily, then apply Silvadene cream.

## 2020-10-30 NOTE — Progress Notes (Signed)
Patient states understanding of discharge instructions, d/ced from unit

## 2020-11-01 ENCOUNTER — Telehealth: Payer: Self-pay | Admitting: Family Medicine

## 2020-11-01 NOTE — Telephone Encounter (Signed)
Pt called stating that her foot is swelling up again and very painful. She states that she did go to work last night and her job keep her on her feet most of the night.   My recommendations were as follows: 1. She could go to the ER for evaluation and pain management.  2. She needs to keep her leg propped up as much as possible and can rotate Tylenol and Ibuprofen q 4-6 hours for pain.   If she needs a work note we will be happy to write that for her.

## 2020-11-02 LAB — CULTURE, BLOOD (ROUTINE X 2)
Culture: NO GROWTH
Culture: NO GROWTH

## 2020-11-07 ENCOUNTER — Ambulatory Visit (INDEPENDENT_AMBULATORY_CARE_PROVIDER_SITE_OTHER): Payer: Self-pay | Admitting: General Surgery

## 2020-11-07 ENCOUNTER — Encounter: Payer: Self-pay | Admitting: General Surgery

## 2020-11-07 ENCOUNTER — Other Ambulatory Visit: Payer: Self-pay

## 2020-11-07 VITALS — BP 166/92 | HR 72 | Temp 98.3°F | Resp 14 | Ht 65.0 in | Wt 171.0 lb

## 2020-11-07 DIAGNOSIS — L03032 Cellulitis of left toe: Secondary | ICD-10-CM

## 2020-11-07 DIAGNOSIS — L03031 Cellulitis of right toe: Secondary | ICD-10-CM

## 2020-11-07 NOTE — Progress Notes (Signed)
Subjective:     Maria Leblanc  Here for hospital follow-up.  Patient treated for cellulitis of left great toe.  She has been applying peroxide and Silvadene/Neosporin ointment to the left great toe wound.  She states it is sore but she is feeling better. Objective:    BP (!) 166/92   Pulse 72   Temp 98.3 F (36.8 C) (Other (Comment))   Resp 14   Ht 5\' 5"  (1.651 m)   Wt 171 lb (77.6 kg)   SpO2 95%   BMI 28.46 kg/m   General:  alert, cooperative, and no distress  Left great toe with healed eschar noted along the medial aspect of the left great toe.  Also has evidence of an eschar on the distal tuft of the left fourth toe.  Palpable dorsalis pedis and posterior tibial pulses noted.     Assessment:    Left great toe and fourth toe cellulitis, stable    Plan:  Would stop peroxide spray to the foot.  Would continue Neosporin ointment to the wound.  Avoiding trauma to the wounds.  Follow-up in my office in 2 weeks.

## 2020-11-21 ENCOUNTER — Other Ambulatory Visit: Payer: Self-pay

## 2020-11-21 ENCOUNTER — Ambulatory Visit (INDEPENDENT_AMBULATORY_CARE_PROVIDER_SITE_OTHER): Payer: Self-pay | Admitting: General Surgery

## 2020-11-21 ENCOUNTER — Encounter: Payer: Self-pay | Admitting: General Surgery

## 2020-11-21 VITALS — BP 182/100 | HR 76 | Temp 97.6°F | Resp 16 | Ht 65.0 in | Wt 172.0 lb

## 2020-11-21 DIAGNOSIS — L03032 Cellulitis of left toe: Secondary | ICD-10-CM

## 2020-11-21 NOTE — Progress Notes (Signed)
Subjective:     Maria Leblanc  Here for follow-up wound check.  Patient is back at work.  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  She is trying to pad her feet.  She is cleaning her left foot with soap and water daily.  She is having some burning sensation in her feet.  She states her A1c is down to 8.  Her glucoses are much better. Objective:    BP (!) 182/100   Pulse 76   Temp 97.6 F (36.4 C) (Oral)   Resp 16   Ht 5\' 5"  (1.651 m)   Wt 172 lb (78 kg)   SpO2 92%   BMI 28.62 kg/m   General:  alert, cooperative, and no distress   Media Information         Document Information  Photos    11/21/2020 10:23  Attached To:  Office Visit on 11/21/20 with 11/23/20, MD   Source Information  Franky Macho, MD  [facility]  I am worried about the development of wounds in the fourth and fifth toe.  The soft eschar of the left great toe is receding.  She is able to rub off some of the eschar.     Assessment:    Left diabetic foot cellulitis with some resolution of the left great toe, but new developing wounds in the fourth and fifth toes.    Plan:   Continue control of your diabetes.  Use soap and water with a wash rag to get the eschar removed.  Continue protecting toes is much as possible and avoid pressure to the toes.  Follow-up here in 3 weeks.

## 2020-12-12 ENCOUNTER — Ambulatory Visit: Payer: Self-pay | Admitting: General Surgery

## 2021-01-16 ENCOUNTER — Ambulatory Visit: Payer: Self-pay | Admitting: General Surgery

## 2021-01-21 ENCOUNTER — Ambulatory Visit: Payer: Self-pay | Admitting: General Surgery

## 2021-04-16 ENCOUNTER — Encounter (HOSPITAL_COMMUNITY): Payer: Self-pay

## 2021-04-16 ENCOUNTER — Inpatient Hospital Stay (HOSPITAL_COMMUNITY)
Admission: EM | Admit: 2021-04-16 | Discharge: 2021-04-29 | DRG: 638 | Disposition: A | Discharge status: Home Health | Payer: Self-pay | Attending: Internal Medicine | Admitting: Internal Medicine

## 2021-04-16 ENCOUNTER — Emergency Department (HOSPITAL_COMMUNITY): Payer: Self-pay

## 2021-04-16 ENCOUNTER — Other Ambulatory Visit: Payer: Self-pay

## 2021-04-16 DIAGNOSIS — E1169 Type 2 diabetes mellitus with other specified complication: Principal | ICD-10-CM | POA: Diagnosis present

## 2021-04-16 DIAGNOSIS — L089 Local infection of the skin and subcutaneous tissue, unspecified: Secondary | ICD-10-CM

## 2021-04-16 DIAGNOSIS — S90822A Blister (nonthermal), left foot, initial encounter: Secondary | ICD-10-CM | POA: Diagnosis present

## 2021-04-16 DIAGNOSIS — Z7984 Long term (current) use of oral hypoglycemic drugs: Secondary | ICD-10-CM

## 2021-04-16 DIAGNOSIS — Z28310 Unvaccinated for covid-19: Secondary | ICD-10-CM

## 2021-04-16 DIAGNOSIS — L97529 Non-pressure chronic ulcer of other part of left foot with unspecified severity: Secondary | ICD-10-CM | POA: Diagnosis present

## 2021-04-16 DIAGNOSIS — L03116 Cellulitis of left lower limb: Secondary | ICD-10-CM | POA: Diagnosis present

## 2021-04-16 DIAGNOSIS — Z8249 Family history of ischemic heart disease and other diseases of the circulatory system: Secondary | ICD-10-CM

## 2021-04-16 DIAGNOSIS — L97519 Non-pressure chronic ulcer of other part of right foot with unspecified severity: Secondary | ICD-10-CM | POA: Diagnosis present

## 2021-04-16 DIAGNOSIS — E119 Type 2 diabetes mellitus without complications: Secondary | ICD-10-CM

## 2021-04-16 DIAGNOSIS — E11628 Type 2 diabetes mellitus with other skin complications: Secondary | ICD-10-CM | POA: Diagnosis present

## 2021-04-16 DIAGNOSIS — E11621 Type 2 diabetes mellitus with foot ulcer: Secondary | ICD-10-CM | POA: Diagnosis present

## 2021-04-16 DIAGNOSIS — M86179 Other acute osteomyelitis, unspecified ankle and foot: Secondary | ICD-10-CM

## 2021-04-16 DIAGNOSIS — S90821A Blister (nonthermal), right foot, initial encounter: Secondary | ICD-10-CM | POA: Diagnosis present

## 2021-04-16 DIAGNOSIS — S90422A Blister (nonthermal), left great toe, initial encounter: Secondary | ICD-10-CM | POA: Diagnosis present

## 2021-04-16 DIAGNOSIS — L03119 Cellulitis of unspecified part of limb: Secondary | ICD-10-CM

## 2021-04-16 DIAGNOSIS — Z833 Family history of diabetes mellitus: Secondary | ICD-10-CM

## 2021-04-16 DIAGNOSIS — L03032 Cellulitis of left toe: Secondary | ICD-10-CM | POA: Diagnosis present

## 2021-04-16 DIAGNOSIS — F17210 Nicotine dependence, cigarettes, uncomplicated: Secondary | ICD-10-CM | POA: Diagnosis present

## 2021-04-16 DIAGNOSIS — E039 Hypothyroidism, unspecified: Secondary | ICD-10-CM | POA: Diagnosis present

## 2021-04-16 DIAGNOSIS — M86172 Other acute osteomyelitis, left ankle and foot: Secondary | ICD-10-CM | POA: Diagnosis present

## 2021-04-16 DIAGNOSIS — Z79899 Other long term (current) drug therapy: Secondary | ICD-10-CM

## 2021-04-16 DIAGNOSIS — L03115 Cellulitis of right lower limb: Secondary | ICD-10-CM | POA: Diagnosis present

## 2021-04-16 DIAGNOSIS — I10 Essential (primary) hypertension: Secondary | ICD-10-CM | POA: Diagnosis present

## 2021-04-16 DIAGNOSIS — L03031 Cellulitis of right toe: Secondary | ICD-10-CM | POA: Diagnosis present

## 2021-04-16 HISTORY — DX: Type 2 diabetes mellitus without complications: E11.9

## 2021-04-16 LAB — COMPREHENSIVE METABOLIC PANEL
ALT: 16 U/L (ref 0–44)
AST: 17 U/L (ref 15–41)
Albumin: 3.8 g/dL (ref 3.5–5.0)
Alkaline Phosphatase: 106 U/L (ref 38–126)
Anion gap: 7 (ref 5–15)
BUN: 12 mg/dL (ref 8–23)
CO2: 27 mmol/L (ref 22–32)
Calcium: 10.3 mg/dL (ref 8.9–10.3)
Chloride: 105 mmol/L (ref 98–111)
Creatinine, Ser: 0.44 mg/dL (ref 0.44–1.00)
GFR, Estimated: >60 mL/min (ref >60)
Glucose, Bld: 171 mg/dL — ABNORMAL HIGH (ref 70–99)
Potassium: 3.6 mmol/L (ref 3.5–5.1)
Sodium: 139 mmol/L (ref 135–145)
Total Bilirubin: 0.1 mg/dL — ABNORMAL LOW (ref 0.3–1.2)
Total Protein: 7.8 g/dL (ref 6.5–8.1)

## 2021-04-16 LAB — CBC WITH DIFFERENTIAL/PLATELET
Abs Immature Granulocytes: 0.04 10*3/uL (ref 0.00–0.07)
Basophils Absolute: 0 10*3/uL (ref 0.0–0.1)
Basophils Relative: 0 %
Eosinophils Absolute: 0.4 10*3/uL (ref 0.0–0.5)
Eosinophils Relative: 3 %
HCT: 44.8 % (ref 36.0–46.0)
Hemoglobin: 14.8 g/dL (ref 12.0–15.0)
Immature Granulocytes: 0 %
Lymphocytes Relative: 14 %
Lymphs Abs: 1.8 10*3/uL (ref 0.7–4.0)
MCH: 31.4 pg (ref 26.0–34.0)
MCHC: 33 g/dL (ref 30.0–36.0)
MCV: 95.1 fL (ref 80.0–100.0)
Monocytes Absolute: 0.6 10*3/uL (ref 0.1–1.0)
Monocytes Relative: 5 %
Neutro Abs: 9.4 10*3/uL — ABNORMAL HIGH (ref 1.7–7.7)
Neutrophils Relative %: 78 %
Platelets: 250 10*3/uL (ref 150–400)
RBC: 4.71 MIL/uL (ref 3.87–5.11)
RDW: 13.1 % (ref 11.5–15.5)
WBC: 12.3 10*3/uL — ABNORMAL HIGH (ref 4.0–10.5)
nRBC: 0 % (ref 0.0–0.2)

## 2021-04-16 LAB — LACTIC ACID, PLASMA: Lactic Acid, Venous: 1.6 mmol/L (ref 0.5–1.9)

## 2021-04-16 LAB — APTT: aPTT: 25 seconds (ref 24–36)

## 2021-04-16 LAB — PROTIME-INR
INR: 1.1 (ref 0.8–1.2)
Prothrombin Time: 14.3 seconds (ref 11.4–15.2)

## 2021-04-16 MED ORDER — PIPERACILLIN-TAZOBACTAM 3.375 G IVPB 30 MIN
3.3750 g | Freq: Once | INTRAVENOUS | Status: AC
  Administered 2021-04-17: 3.375 g via INTRAVENOUS
  Filled 2021-04-16: qty 50

## 2021-04-16 MED ORDER — LACTATED RINGERS IV SOLN: INTRAVENOUS | Status: AC

## 2021-04-16 MED ORDER — VANCOMYCIN HCL IN DEXTROSE 1-5 GM/200ML-% IV SOLN
1000.0000 mg | Freq: Once | INTRAVENOUS | Status: AC
  Administered 2021-04-17: 1000 mg via INTRAVENOUS
  Filled 2021-04-16: qty 200

## 2021-04-16 NOTE — ED Triage Notes (Signed)
Pt arrived via POV c/o bilateral infections in feet. Pt reports being started on antibiotics by her PCP yesterday, but was advised to seek medical attention in ER if infection worsened. Pt ambulatory.

## 2021-04-16 NOTE — ED Provider Notes (Signed)
°  Provider Note MRN:  [id number]  Arrival date & time: 04/16/21    ED Course and Medical Decision Making  Assumed care from Dr. Lynelle Doctor at shift change.  History of diabetes, wounds of bilateral feet with blistering and obvious infection, failing outpatient antibiotics, will request admission.  Procedures  Final Clinical Impressions(s) / ED Diagnoses     ICD-10-CM   1. Cellulitis of lower extremity, unspecified laterality  L03.119     2. Foot infection  L08.9 DG Foot Complete Right    DG Foot Complete Right      ED Discharge Orders     None       Discharge Instructions   None     Elmer Sow. Pilar Plate, MD [facility] [email or web address]    Sabas Sous, MD 04/17/21 [phone]

## 2021-04-16 NOTE — ED Provider Notes (Signed)
[facility] EMERGENCY DEPARTMENT Provider Note   CSN: [id number] Arrival date & time: 04/16/21  2054     History Chief Complaint  Patient presents with   Wound Infection    Maria Leblanc is a 65 y.o. female.  HPI  Patient presents to the ED for evaluation of foot infection.  Patient states she noticed over the last week that she developed blisters on her toes.  She started developing redness swelling and drainage.  She went to her PCP office yesterday and they started her on antibiotics but told her there is a good chance she would need to come to the hospital.  Today she noticed increasing redness and drainage so she came to the ED for evaluation.  She has not had any fevers.  She states she is in a mild headache.  She denies any nausea vomiting or diarrhea.  Patient denies any new shoes.  She denies wearing wet socks.  She denies being out in the cold or injuring her feet on a heater.  Past Medical History:  Diagnosis Date   Ankle fracture 02/18/2016   left   Borderline diabetes    Complication of anesthesia    "fighting with anesthesia when waking up"   Diabetes mellitus without complication (HCC)    Diverticulosis    Headache    History of kidney stones    Hypothyroid    no longer needs med    Patient Active Problem List   Diagnosis Date Noted   Foot ulcer due to secondary DM (HCC)    Cellulitis of foot 10/28/2020   Essential hypertension 10/28/2020   Cellulitis 10/28/2020   LLQ pain 07/19/2019   N&V (nausea and vomiting) 07/19/2019   Diarrhea 07/19/2019   Perirectal abscess    Calculus of gallbladder with acute cholecystitis without obstruction    Sepsis (HCC) 03/27/2017   RUQ pain 03/27/2017   Hyponatremia 03/27/2017   Thrombocytopenia (HCC) 03/27/2017   Diabetes mellitus type II, non insulin dependent (HCC) 03/27/2017   Adrenal nodule (HCC) 03/27/2017    Past Surgical History:  Procedure Laterality Date   CHOLECYSTECTOMY N/A 03/29/2017   Procedure:  LAPAROSCOPIC CHOLECYSTECTOMY;  Surgeon: Lucretia Roers, MD;  Location: [facility];  Service: General;  Laterality: N/A;   CYST REMOVAL NECK     x3   DIAGNOSTIC LAPAROSCOPY     as teenager   INCISION AND DRAINAGE PERIRECTAL ABSCESS  03/29/2017   Procedure: INCISION  AND DRAINAGE PERIRECTAL ABSCESS;  Surgeon: Lucretia Roers, MD;  Location: [facility];  Service: General;;   ORIF ANKLE FRACTURE Left 02/28/2016   Procedure: OPEN REDUCTION INTERNAL FIXATION (ORIF) ANKLE FRACTURE, LEFT;  Surgeon: Sheral Apley, MD;  Location: [facility];  Service: Orthopedics;  Laterality: Left;   TONSILLECTOMY     as child     OB History   No obstetric history on file.     Family History  Problem Relation Age of Onset   Diabetes Mother    Other Mother    Dementia Mother    Cancer Father    Heart attack Father    Diabetes Father    Other Brother     Social History   Tobacco Use   Smoking status: Every Day    Packs/day: 0.50    Years: 20.00    Pack years: 10.00    Types: Cigarettes   Smokeless tobacco: Never  Vaping Use   Vaping Use: Never used  Substance Use Topics   Alcohol  use: Not Currently    Comment: rare use of liquor 3 times yearly; denied 07/19/19   Drug use: No    Home Medications Prior to Admission medications   Medication Sig Start Date End Date Taking? Authorizing Provider  acetaminophen (TYLENOL) 325 MG tablet Take 650 mg by mouth every 6 (six) hours as needed.    [provider]  albuterol (VENTOLIN HFA) 108 (90 Base) MCG/ACT inhaler Inhale 1 puff into the lungs 4 (four) times daily as needed. 08/02/20   [provider]  amLODipine (NORVASC) 10 MG tablet Take 1 tablet (10 mg total) by mouth daily. 10/31/20 11/30/20  Sherryll Burger, Pratik D, DO  busPIRone (BUSPAR) 5 MG tablet Take 5 mg by mouth daily.    [provider]  dapagliflozin propanediol (FARXIGA) 10 MG TABS tablet Take 10 mg by mouth daily.    [provider]  dicyclomine  (BENTYL) 10 MG capsule Take one capsule before meals and at bedtime as needed for abdominal pain, loose stool. 07/19/19   Tiffany Kocher, PA-C  glipiZIDE (GLUCOTROL XL) 5 MG 24 hr tablet Take 5 mg by mouth daily. 08/29/20   [provider]  ibuprofen (ADVIL) 200 MG tablet Take 200 mg by mouth every 6 (six) hours as needed.    [provider]  lisinopril (ZESTRIL) 40 MG tablet Take 40 mg by mouth daily. 09/04/20   [provider]  metFORMIN (GLUCOPHAGE) 1000 MG tablet Take 1,000 mg by mouth 2 (two) times daily. 06/29/19   [provider]  silver sulfADIAZINE (SILVADENE) 1 % cream Apply 1 application topically 2 (two) times daily. 10/30/20   Sherryll Burger, Pratik D, DO    Allergies    Propofol  Review of Systems   Review of Systems  All other systems reviewed and are negative.  Physical Exam Updated Vital Signs BP 134/74    Pulse 67    Temp 97.7 F (36.5 C) (Oral)    Resp 18    Ht 1.676 m ( )    Wt 77.1 kg    SpO2 100%    BMI 27.44 kg/m   Physical Exam Vitals and nursing note reviewed.  Constitutional:      General: She is not in acute distress.    Appearance: She is well-developed.  HENT:     Head: Normocephalic and atraumatic.     Right Ear: External ear normal.     Left Ear: External ear normal.  Eyes:     General: No scleral icterus.       Right eye: No discharge.        Left eye: No discharge.     Conjunctiva/sclera: Conjunctivae normal.  Neck:     Trachea: No tracheal deviation.  Cardiovascular:     Rate and Rhythm: Normal rate and regular rhythm.  Pulmonary:     Effort: Pulmonary effort is normal. No respiratory distress.     Breath sounds: Normal breath sounds. No stridor. No wheezing or rales.  Abdominal:     General: Bowel sounds are normal. There is no distension.     Palpations: Abdomen is soft.     Tenderness: There is no abdominal tenderness. There is no guarding or rebound.  Musculoskeletal:        General: Swelling and tenderness  present. No deformity.     Cervical back: Neck supple.     Comments: Serous containing blisters noted on the plantar aspect of the toes of her bilateral feet, left great toe with significant  amount of serous drainage and redness, no lymphangitic streaking, some mucopurulent drainage noted between the toes of the right foot  Skin:    General: Skin is warm and dry.     Findings: No rash.  Neurological:     General: No focal deficit present.     Mental Status: She is alert.     Cranial Nerves: No cranial nerve deficit (no facial droop, extraocular movements intact, no slurred speech).     Sensory: No sensory deficit.     Motor: No abnormal muscle tone or seizure activity.     Coordination: Coordination normal.  Psychiatric:        Mood and Affect: Mood normal.    ED Results / Procedures / Treatments   Labs (all labs ordered are listed, but only abnormal results are displayed) Labs Reviewed  CBC WITH DIFFERENTIAL/PLATELET - Abnormal; Notable for the following components:      Result Value   WBC 12.3 (*)    Neutro Abs 9.4 (*)    All other components within normal limits  CULTURE, BLOOD (ROUTINE X 2)  CULTURE, BLOOD (ROUTINE X 2)  PROTIME-INR  APTT  LACTIC ACID, PLASMA  LACTIC ACID, PLASMA  COMPREHENSIVE METABOLIC PANEL    EKG None  Radiology DG Chest Port 1 View  Result Date: 04/16/2021 CLINICAL DATA:  Questionable sepsis. EXAM: PORTABLE CHEST 1 VIEW COMPARISON:  Chest x-ray 11/23/2019 FINDINGS: The heart size and mediastinal contours are within normal limits. Both lungs are clear. The visualized skeletal structures are unremarkable. IMPRESSION: No active disease. Electronically Signed   By: Darliss Cheney M.D.   On: 04/16/2021 22:53   DG Foot Complete Left  Result Date: 04/16/2021 CLINICAL DATA:  Bilateral foot infection. EXAM: LEFT FOOT - COMPLETE 3+ VIEW COMPARISON:  None. FINDINGS: There is no evidence of acute fracture or dislocation. A radiopaque fixation plate and  screws are seen along the visualized portion of the left lateral malleolus. There is a large plantar calcaneal spur. Moderate severity degenerative changes are noted along the dorsal aspect of the proximal to mid left foot. Soft tissues are unremarkable. IMPRESSION: 1. Degenerative changes without evidence of an acute osseous abnormality. 2. Prior ORIF of the left lateral malleolus. 3. Large plantar calcaneal spur. Electronically Signed   By: Aram Candela M.D.   On: 04/16/2021 23:24   DG Foot Complete Right  Result Date: 04/16/2021 CLINICAL DATA:  Bilateral foot infection. EXAM: RIGHT FOOT COMPLETE - 3+ VIEW COMPARISON:  None. FINDINGS: There is no evidence of acute fracture or dislocation. A chronic appearing deformity is seen involving the distal aspect of the proximal phalanx of the fourth right toe. There is a moderate-sized plantar calcaneal spur. Moderate severity degenerative changes are seen along the dorsal aspect of the mid right foot. Mild soft tissue swelling is seen along the dorsal aspect of the mid to distal right foot. IMPRESSION: Chronic and degenerative changes, as described above, without evidence of acute osseous abnormality. Electronically Signed   By: Aram Candela M.D.   On: 04/16/2021 23:23    Procedures Procedures   Medications Ordered in ED Medications  lactated ringers infusion ( Intravenous New Bag/Given 04/16/21 2302)  piperacillin-tazobactam (ZOSYN) IVPB 3.375 g (has no administration in time range)  vancomycin (VANCOCIN) IVPB 1000 mg/200 mL premix (has no administration in time range)    ED Course  I have reviewed the triage vital signs and the nursing notes.  Pertinent labs & imaging results that were available during my care of  the patient were reviewed by me and considered in my medical decision making (see chart for details).    MDM Rules/Calculators/A&P                         Patient presented to the ED for evaluation of bilateral foot infection.   Patient has history of diabetes.  She has evidence blistered lesions on her toes.  Hemodynamically stable without signs of sepsis.  Patient however has failed outpatient management.  IV antibiotics have been ordered.  With her worsening infection and her history of diabetes we will consult the medical service for admission and further treatment.    Final Clinical Impression(s) / ED Diagnoses Final diagnoses:  Cellulitis of lower extremity, unspecified laterality     Linwood Dibbles, MD 04/16/21 [phone]

## 2021-04-17 ENCOUNTER — Inpatient Hospital Stay (HOSPITAL_COMMUNITY): Payer: Self-pay

## 2021-04-17 DIAGNOSIS — L97529 Non-pressure chronic ulcer of other part of left foot with unspecified severity: Secondary | ICD-10-CM

## 2021-04-17 DIAGNOSIS — E11621 Type 2 diabetes mellitus with foot ulcer: Secondary | ICD-10-CM | POA: Diagnosis present

## 2021-04-17 DIAGNOSIS — I10 Essential (primary) hypertension: Secondary | ICD-10-CM

## 2021-04-17 DIAGNOSIS — E119 Type 2 diabetes mellitus without complications: Secondary | ICD-10-CM

## 2021-04-17 LAB — COMPREHENSIVE METABOLIC PANEL
ALT: 15 U/L (ref 0–44)
AST: 14 U/L — ABNORMAL LOW (ref 15–41)
Albumin: 3.5 g/dL (ref 3.5–5.0)
Alkaline Phosphatase: 90 U/L (ref 38–126)
Anion gap: 8 (ref 5–15)
BUN: 12 mg/dL (ref 8–23)
CO2: 25 mmol/L (ref 22–32)
Calcium: 9.8 mg/dL (ref 8.9–10.3)
Chloride: 106 mmol/L (ref 98–111)
Creatinine, Ser: 0.37 mg/dL — ABNORMAL LOW (ref 0.44–1.00)
GFR, Estimated: >60 mL/min (ref >60)
Glucose, Bld: 122 mg/dL — ABNORMAL HIGH (ref 70–99)
Potassium: 4 mmol/L (ref 3.5–5.1)
Sodium: 139 mmol/L (ref 135–145)
Total Bilirubin: 0.5 mg/dL (ref 0.3–1.2)
Total Protein: 7.1 g/dL (ref 6.5–8.1)

## 2021-04-17 LAB — C-REACTIVE PROTEIN: CRP: 1.5 mg/dL — ABNORMAL HIGH (ref <1.0)

## 2021-04-17 LAB — CBC WITH DIFFERENTIAL/PLATELET
Abs Immature Granulocytes: 0.05 10*3/uL (ref 0.00–0.07)
Basophils Absolute: 0 10*3/uL (ref 0.0–0.1)
Basophils Relative: 0 %
Eosinophils Absolute: 0.2 10*3/uL (ref 0.0–0.5)
Eosinophils Relative: 2 %
HCT: 42.5 % (ref 36.0–46.0)
Hemoglobin: 13.9 g/dL (ref 12.0–15.0)
Immature Granulocytes: 0 %
Lymphocytes Relative: 11 %
Lymphs Abs: 1.4 10*3/uL (ref 0.7–4.0)
MCH: 31 pg (ref 26.0–34.0)
MCHC: 32.7 g/dL (ref 30.0–36.0)
MCV: 94.9 fL (ref 80.0–100.0)
Monocytes Absolute: 0.6 10*3/uL (ref 0.1–1.0)
Monocytes Relative: 5 %
Neutro Abs: 10.8 10*3/uL — ABNORMAL HIGH (ref 1.7–7.7)
Neutrophils Relative %: 82 %
Platelets: 219 10*3/uL (ref 150–400)
RBC: 4.48 MIL/uL (ref 3.87–5.11)
RDW: 13 % (ref 11.5–15.5)
WBC: 13.1 10*3/uL — ABNORMAL HIGH (ref 4.0–10.5)
nRBC: 0 % (ref 0.0–0.2)

## 2021-04-17 LAB — GLUCOSE, CAPILLARY
Glucose-Capillary: 107 mg/dL — ABNORMAL HIGH (ref 70–99)
Glucose-Capillary: 120 mg/dL — ABNORMAL HIGH (ref 70–99)
Glucose-Capillary: 138 mg/dL — ABNORMAL HIGH (ref 70–99)

## 2021-04-17 LAB — TSH: TSH: 1.776 u[IU]/mL (ref 0.350–4.500)

## 2021-04-17 LAB — HEMOGLOBIN A1C
Hgb A1c MFr Bld: 7.1 % — ABNORMAL HIGH (ref 4.8–5.6)
Mean Plasma Glucose: 157.07 mg/dL

## 2021-04-17 LAB — MAGNESIUM: Magnesium: 1.9 mg/dL (ref 1.7–2.4)

## 2021-04-17 LAB — CBG MONITORING, ED: Glucose-Capillary: 104 mg/dL — ABNORMAL HIGH (ref 70–99)

## 2021-04-17 LAB — SEDIMENTATION RATE: Sed Rate: 40 mm/hr — ABNORMAL HIGH (ref 0–22)

## 2021-04-17 MED ORDER — FENTANYL CITRATE PF 50 MCG/ML IJ SOSY
50.0000 ug | PREFILLED_SYRINGE | Freq: Once | INTRAMUSCULAR | Status: AC
  Administered 2021-04-17: 50 ug via INTRAVENOUS
  Filled 2021-04-17: qty 1

## 2021-04-17 MED ORDER — PIPERACILLIN-TAZOBACTAM 3.375 G IVPB
3.3750 g | Freq: Three times a day (TID) | INTRAVENOUS | Status: DC
  Administered 2021-04-17 – 2021-04-21 (×13): 3.375 g via INTRAVENOUS
  Filled 2021-04-17 (×14): qty 50

## 2021-04-17 MED ORDER — GADOBUTROL 1 MMOL/ML IV SOLN
7.0000 mL | Freq: Once | INTRAVENOUS | Status: AC | PRN
  Administered 2021-04-17: 08:00:00 7 mL via INTRAVENOUS

## 2021-04-17 MED ORDER — ACETAMINOPHEN 650 MG RE SUPP: 650.0000 mg | Freq: Four times a day (QID) | RECTAL | Status: DC | PRN

## 2021-04-17 MED ORDER — LISINOPRIL 10 MG PO TABS
40.0000 mg | ORAL_TABLET | Freq: Every day | ORAL | Status: DC
  Administered 2021-04-17 – 2021-04-29 (×13): 40 mg via ORAL
  Filled 2021-04-17 (×13): qty 4

## 2021-04-17 MED ORDER — PIPERACILLIN-TAZOBACTAM 3.375 G IVPB 30 MIN: 3.3750 g | Freq: Three times a day (TID) | INTRAVENOUS | Status: DC

## 2021-04-17 MED ORDER — ONDANSETRON HCL 4 MG PO TABS
4.0000 mg | ORAL_TABLET | Freq: Four times a day (QID) | ORAL | Status: DC | PRN
  Administered 2021-04-23: 14:00:00 4 mg via ORAL
  Filled 2021-04-17: qty 1

## 2021-04-17 MED ORDER — ONDANSETRON HCL 4 MG/2ML IJ SOLN
4.0000 mg | Freq: Four times a day (QID) | INTRAMUSCULAR | Status: DC | PRN
  Administered 2021-04-23: 16:00:00 4 mg via INTRAVENOUS
  Filled 2021-04-17: qty 2

## 2021-04-17 MED ORDER — ACETAMINOPHEN 325 MG PO TABS
650.0000 mg | ORAL_TABLET | Freq: Four times a day (QID) | ORAL | Status: DC | PRN
  Administered 2021-04-17 – 2021-04-18 (×2): 650 mg via ORAL
  Filled 2021-04-17 (×3): qty 2

## 2021-04-17 MED ORDER — VANCOMYCIN HCL 750 MG/150ML IV SOLN
750.0000 mg | Freq: Two times a day (BID) | INTRAVENOUS | Status: DC
  Administered 2021-04-17 – 2021-04-20 (×8): 750 mg via INTRAVENOUS
  Filled 2021-04-17 (×11): qty 150

## 2021-04-17 MED ORDER — OXYCODONE HCL 5 MG PO TABS
5.0000 mg | ORAL_TABLET | ORAL | Status: DC | PRN
  Administered 2021-04-18 – 2021-04-19 (×2): 5 mg via ORAL
  Filled 2021-04-17 (×2): qty 1

## 2021-04-17 MED ORDER — FENTANYL CITRATE PF 50 MCG/ML IJ SOSY
12.5000 ug | PREFILLED_SYRINGE | INTRAMUSCULAR | Status: DC | PRN
  Administered 2021-04-23: 50 ug via INTRAVENOUS
  Filled 2021-04-17: qty 1

## 2021-04-17 MED ORDER — INSULIN ASPART 100 UNIT/ML IJ SOLN
0.0000 [IU] | Freq: Three times a day (TID) | INTRAMUSCULAR | Status: DC
  Administered 2021-04-19 (×2): 2 [IU] via SUBCUTANEOUS
  Administered 2021-04-20: 09:00:00 3 [IU] via SUBCUTANEOUS
  Administered 2021-04-20 – 2021-04-21 (×2): 2 [IU] via SUBCUTANEOUS
  Administered 2021-04-21 – 2021-04-22 (×2): 3 [IU] via SUBCUTANEOUS
  Administered 2021-04-22: 08:00:00 2 [IU] via SUBCUTANEOUS
  Administered 2021-04-22: 12:00:00 3 [IU] via SUBCUTANEOUS
  Administered 2021-04-23: 09:00:00 2 [IU] via SUBCUTANEOUS
  Administered 2021-04-23: 12:00:00 5 [IU] via SUBCUTANEOUS
  Administered 2021-04-24: 17:00:00 8 [IU] via SUBCUTANEOUS
  Administered 2021-04-24 – 2021-04-25 (×3): 3 [IU] via SUBCUTANEOUS
  Administered 2021-04-25: 12:00:00 5 [IU] via SUBCUTANEOUS
  Administered 2021-04-26 (×3): 3 [IU] via SUBCUTANEOUS
  Administered 2021-04-27: 2 [IU] via SUBCUTANEOUS
  Administered 2021-04-27 – 2021-04-28 (×3): 3 [IU] via SUBCUTANEOUS
  Administered 2021-04-28: 8 [IU] via SUBCUTANEOUS
  Administered 2021-04-28: 3 [IU] via SUBCUTANEOUS
  Administered 2021-04-29: 8 [IU] via SUBCUTANEOUS
  Administered 2021-04-29 (×2): 3 [IU] via SUBCUTANEOUS

## 2021-04-17 MED ORDER — HEPARIN SODIUM (PORCINE) 5000 UNIT/ML IJ SOLN
5000.0000 [IU] | Freq: Three times a day (TID) | INTRAMUSCULAR | Status: DC
  Administered 2021-04-17 – 2021-04-29 (×22): 5000 [IU] via SUBCUTANEOUS
  Filled 2021-04-17 (×33): qty 1

## 2021-04-17 MED ORDER — INSULIN ASPART 100 UNIT/ML IJ SOLN
0.0000 [IU] | Freq: Every day | INTRAMUSCULAR | Status: DC
  Administered 2021-04-23 – 2021-04-25 (×3): 2 [IU] via SUBCUTANEOUS
  Administered 2021-04-27: 4 [IU] via SUBCUTANEOUS
  Administered 2021-04-28: 2 [IU] via SUBCUTANEOUS

## 2021-04-17 MED ORDER — BUSPIRONE HCL 5 MG PO TABS
5.0000 mg | ORAL_TABLET | Freq: Every day | ORAL | Status: DC
  Filled 2021-04-17 (×2): qty 1

## 2021-04-17 NOTE — Progress Notes (Signed)
Pharmacy Antibiotic Note  Maria Leblanc is a 65 y.o. female admitted on 04/16/2021 with cellulitis.  Pharmacy has been consulted for vancomycin dosing.  Plan: Vancomycin 1000mg  given in ED then 750mg  IV Q12H. Goal AUC 400-550.  Expected AUC 450.  SCr used 0.8 (actual 0.44).  Height: 5\' 6"  (167.6 cm) Weight: 77.1 kg (170 lb) IBW/kg (Calculated) : 59.3  Temp (24hrs), Avg:97.7 F (36.5 C), Min:97.7 F (36.5 C), Max:97.7 F (36.5 C)  Recent Labs  Lab 04/16/21 2257 04/17/21 0403  WBC 12.3* 13.1*  CREATININE 0.44  --   LATICACIDVEN 1.6  --     Estimated Creatinine Clearance: 74.5 mL/min (by C-G formula based on SCr of 0.44 mg/dL).    Allergies  Allergen Reactions   Propofol Other (See Comments)    Patient reports "it causes me to fight them when i'm waking up"    Thank you for allowing pharmacy to be a part of this patients care.  , PharmD, BCPS  04/17/2021 4:23 AM

## 2021-04-17 NOTE — Progress Notes (Signed)
Patient seen and examined.  Admitted after midnight night secondary to diabetic foot ulcer; patient reports symptom has been present for the last 3-4 days and worsening; started first affecting her right foot and is now extending to her left as well. Positive blisters appreciated. Patient reported extensive hours on her feet due to her job; currently experiencing difficulties bearing weight.  Hemodynamically stable otherwise; please refer to H&P written by [name] for further info/details on admission.  Plan: -continue current IV antibiotics -follow MRI results -follow general surgery recommendations -elevated inflammatory markers appreciated; currently not septic.  Vassie Loll MD [phone]

## 2021-04-17 NOTE — H&P (Signed)
TRH H&P    Patient Demographics:    Maria Leblanc, is a 65 y.o. female  MRN: [id number]  DOB - [birth year]  Admit Date - 04/16/2021  Referring MD/NP/PA: Pilar Plate  Outpatient Primary MD for the patient is Golden Pop, FNP  Patient coming from: Home  Chief complaint-blisters on feet   HPI:    [name]  is a 65 y.o. female, with history of diabetes mellitus type 2, hypothyroid, MR presents to ED with a chief complaint of blisters on feet.  Patient reports that started few days ago.  She is not exactly sure when.  She did present to her PCP on Tuesday.  She reports she was given a shot of some medication that she is not sure of in the office, and then started on doxycycline.  She was told that if her pain became worse come in the ER.  Her left toe then swelled up and so she came into the ER today.  Patient reports that the blisters initially started on her right foot in her third and fourth toes.  They radiated to her right forefoot.  She could not walk because it was so painful.  She did take days off work.  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  On the same day of presentation her blisters noted to her left toe.  She reports her left toes become swollen and purplish.  It drains clear fluid.  Is been so painful that she has not been able to weight-bear on it.  She has had no fevers.  She had no purulent drainage.  Patient is not sure her glucose runs as she does not check it.  She reports compliance with her oral hypoglycemics, but she does not take insulin.  She reports her last hemoglobin A1c was 7.  Patient is a current smoker.  She smokes less than a pack a day and has no intention of quitting.  She does not  drink, use illicit drugs.  She is not vaccinated for COVID.  Patient did have ABI done in July.  She is not sure why.  She does not think she has a history of peripheral  vascular disease.  In the ED Patient is afebrile, heart rate is normal, respiratory rate is normal, blood pressure 174/80, satting 93% Patient leukocytosis 12.3, hemoglobin stable Chemistry panel was unremarkable Blood cultures pending Left foot x-ray shows no acute osseous abnormality, large plantar calcaneal spur Right foot x-ray shows chronic and degenerative changes as described above without evidence of acute osseous abnormality ABI in July shows normal  ankle brachial indices Patient was given Zosyn, vancomycin, LR 150 MLS per hour, fentanyl    Review of systems:    In addition to the HPI above,  No Fever-chills, No Headache, No changes with Vision or hearing, No problems swallowing food or Liquids, No Chest pain, Cough or Shortness of Breath, No Abdominal pain, No Nausea or Vomiting, bowel movements are regular, No Blood in stool or Urine, No dysuria,  No new weakness, tingling, numbness in any extremity, No recent weight gain or loss, No polyuria, polydypsia or polyphagia, No significant Mental Stressors.  All other systems reviewed and are negative.    Past History of the following :    Past Medical History:  Diagnosis Date   Ankle fracture 02/18/2016   left   Borderline diabetes    Complication of anesthesia    "fighting with anesthesia when waking up"   Diabetes mellitus without complication (HCC)    Diverticulosis    Headache    History of kidney stones    Hypothyroid    no longer needs med      Past Surgical History:  Procedure Laterality Date   CHOLECYSTECTOMY N/A 03/29/2017   Procedure: LAPAROSCOPIC CHOLECYSTECTOMY;  Surgeon: Lucretia Roers, MD;  Location: [facility];  Service: General;  Laterality: N/A;   CYST REMOVAL NECK     x3   DIAGNOSTIC LAPAROSCOPY     as teenager   INCISION AND DRAINAGE PERIRECTAL ABSCESS  03/29/2017   Procedure: INCISION  AND DRAINAGE PERIRECTAL ABSCESS;  Surgeon: Lucretia Roers, MD;  Location: [facility];  Service:  General;;   ORIF ANKLE FRACTURE Left 02/28/2016   Procedure: OPEN REDUCTION INTERNAL FIXATION (ORIF) ANKLE FRACTURE, LEFT;  Surgeon: Sheral Apley, MD;  Location: [facility];  Service: Orthopedics;  Laterality: Left;   TONSILLECTOMY     as child      Social History:      Social History   Tobacco Use   Smoking status: Every Day    Packs/day: 0.50    Years: 20.00    Pack years: 10.00    Types: Cigarettes   Smokeless tobacco: Never  Substance Use Topics   Alcohol use: Not Currently    Comment: rare use of liquor 3 times yearly; denied 07/19/19       Family History :     Family History  Problem Relation Age of Onset   Diabetes Mother    Other Mother    Dementia Mother    Cancer Father    Heart attack Father    Diabetes Father    Other Brother       Home Medications:   Prior to Admission medications   Medication Sig Start Date End Date Taking? Authorizing Provider  acetaminophen (TYLENOL) 325 MG tablet Take 650 mg by mouth every 6 (six) hours as needed.    [provider]  albuterol (VENTOLIN HFA) 108 (90 Base) MCG/ACT inhaler Inhale 1 puff into the lungs 4 (four) times daily as needed. 08/02/20   [provider]  amLODipine (NORVASC) 10 MG tablet Take 1 tablet (10 mg total) by mouth daily. 10/31/20 11/30/20  Sherryll Burger, Pratik D, DO  busPIRone (BUSPAR) 5 MG tablet Take 5 mg by mouth daily.    [provider]  dapagliflozin propanediol (FARXIGA) 10 MG TABS tablet Take 10 mg by mouth daily.    [provider]  dicyclomine (BENTYL) 10 MG capsule Take one capsule before meals and at bedtime as needed for abdominal pain, loose stool. 07/19/19   Tiffany Kocher, PA-C  glipiZIDE (GLUCOTROL XL) 5 MG 24 hr tablet Take 5 mg by mouth daily. 08/29/20   [provider]  ibuprofen (ADVIL) 200 MG tablet Take 200 mg by mouth every 6 (six) hours as needed.    [provider]  lisinopril (ZESTRIL) 40 MG tablet Take 40 mg by mouth  daily. 09/04/20   [provider]  metFORMIN (GLUCOPHAGE) 1000 MG tablet Take 1,000 mg by mouth 2 (two) times daily. 06/29/19   [provider]  silver sulfADIAZINE (SILVADENE) 1 % cream Apply 1 application topically 2 (two) times daily. 10/30/20   Maurilio Lovely D, DO     Allergies:     Allergies  Allergen Reactions   Propofol Other (See Comments)    Patient reports "it causes me to fight them when i'm waking up"     Physical Exam:   Vitals  Blood pressure (!) 153/79, pulse 66, temperature 97.7 F (36.5 C), temperature source Oral, resp. rate 16, height 5\' 6"  (1.676 m), weight 77.1 kg, SpO2 98 %.   1.  General: Patient sitting up in bed on telephone,  no acute distress   2. Psychiatric: Alert and oriented x 3, mood and behavior normal for situation, pleasant and cooperative with exam   3. Neurologic: Speech and language are normal, face is symmetric, moves all 4 extremities voluntarily, at baseline without acute deficits on limited exam   4. HEENMT:  Head is atraumatic, normocephalic, pupils reactive to light, neck is supple, trachea is midline, mucous membranes are moist   5. Respiratory : Lungs are clear to auscultation bilaterally without wheezing, rhonchi, rales, no cyanosis, no increase in work of breathing or accessory muscle use   6. Cardiovascular : Heart rate normal, rhythm is regular, no murmurs, rubs or gallops, no peripheral edema, peripheral pulses palpated   7. Gastrointestinal:  Abdomen is soft, nondistended, nontender to palpation bowel sounds active, no masses or organomegaly palpated   8. Skin:  Skin is erythematous, blistered, weeping at great left toe, erythematous and right toes as well   9.Musculoskeletal:   no asymmetry in tone, no peripheral edema, peripheral pulses palpated, no tenderness to palpation in the extremities     Data Review:    CBC Recent Labs  Lab 04/16/21 2257 04/17/21 0403  WBC 12.3* 13.1*  HGB 14.8 13.9   HCT 44.8 42.5  PLT 250 219  MCV 95.1 94.9  MCH 31.4 31.0  MCHC 33.0 32.7  RDW 13.1 13.0  LYMPHSABS 1.8 1.4  MONOABS 0.6 0.6  EOSABS 0.4 0.2  BASOSABS 0.0 0.0   ------------------------------------------------------------------------------------------------------------------  Results for orders placed or performed during the hospital encounter of 04/16/21 (from the past 48 hour(s))  Sedimentation rate     Status: Abnormal   Collection Time: 04/16/21 10:56 PM  Result Value Ref Range   Sed Rate 40 (H) 0 - 22 mm/hr    Comment: Performed at [facility], [address] [place], [place]  Lactic acid, plasma     Status: None   Collection Time: 04/16/21 10:57 PM  Result Value Ref Range   Lactic Acid, Venous 1.6 0.5 - 1.9 mmol/L    Comment: Performed at [facility], [address], [place], [place]  Comprehensive metabolic panel     Status: Abnormal   Collection Time: 04/16/21 10:57 PM  Result Value Ref Range   Sodium 139 135 - 145 mmol/L   Potassium 3.6 3.5 - 5.1 mmol/L   Chloride 105 98 - 111 mmol/L   CO2 27 22 - 32 mmol/L   Glucose, Bld 171 (H) 70 - 99 mg/dL    Comment: Glucose reference range applies only to samples taken after fasting for at least 8 hours.   BUN 12 8 - 23 mg/dL   Creatinine, Ser 04/18/21 0.44 - 1.00 mg/dL   Calcium 3.41 8.9 - 93.7 mg/dL  Total Protein 7.8 6.5 - 8.1 g/dL   Albumin 3.8 3.5 - 5.0 g/dL   AST 17 15 - 41 U/L   ALT 16 0 - 44 U/L   Alkaline Phosphatase 106 38 - 126 U/L   Total Bilirubin 0.1 (L) 0.3 - 1.2 mg/dL   GFR, Estimated >40 >98 mL/min    Comment: (NOTE) Calculated using the CKD-EPI Creatinine Equation (2021)    Anion gap 7 5 - 15    Comment: Performed at [facility], [address], [place], [place] [address]  CBC WITH DIFFERENTIAL     Status: Abnormal   Collection Time: 04/16/21 10:57 PM  Result Value Ref Range   WBC 12.3 (H) 4.0 - 10.5 K/uL   RBC 4.71 3.87 - 5.11 MIL/uL   Hemoglobin 14.8 12.0 - 15.0 g/dL   HCT  78.2 95.6 - 21.3 %   MCV 95.1 80.0 - 100.0 fL   MCH 31.4 26.0 - 34.0 pg   MCHC 33.0 30.0 - 36.0 g/dL   RDW 08.6 57.8 - 46.9 %   Platelets 250 150 - 400 K/uL   nRBC 0.0 0.0 - 0.2 %   Neutrophils Relative % 78 %   Neutro Abs 9.4 (H) 1.7 - 7.7 K/uL   Lymphocytes Relative 14 %   Lymphs Abs 1.8 0.7 - 4.0 K/uL   Monocytes Relative 5 %   Monocytes Absolute 0.6 0.1 - 1.0 K/uL   Eosinophils Relative 3 %   Eosinophils Absolute 0.4 0.0 - 0.5 K/uL   Basophils Relative 0 %   Basophils Absolute 0.0 0.0 - 0.1 K/uL   Immature Granulocytes 0 %   Abs Immature Granulocytes 0.04 0.00 - 0.07 K/uL    Comment: Performed at [facility], [address], [place], [place] [address]  Protime-INR     Status: None   Collection Time: 04/16/21 10:57 PM  Result Value Ref Range   Prothrombin Time 14.3 11.4 - 15.2 seconds   INR 1.1 0.8 - 1.2    Comment: (NOTE) INR goal varies based on device and disease states. Performed at [facility], [address], [place], [place] [address]   APTT     Status: None   Collection Time: 04/16/21 10:57 PM  Result Value Ref Range   aPTT 25 24 - 36 seconds    Comment: Performed at [facility], [address], [place], [place] [address]  Blood Culture (routine x 2)     Status: None (Preliminary result)   Collection Time: 04/16/21 10:57 PM   Specimen: Right Antecubital; Blood  Result Value Ref Range   Specimen Description RIGHT ANTECUBITAL    Special Requests      BOTTLES DRAWN AEROBIC AND ANAEROBIC Blood Culture results may not be optimal due to an excessive volume of blood received in culture bottles Performed at [facility], [address], [place], [place] [address]    Culture PENDING    Report Status PENDING   Blood Culture (routine x 2)     Status: None (Preliminary result)   Collection Time: 04/16/21 10:57 PM   Specimen: Left Antecubital; Blood  Result Value Ref Range   Specimen Description LEFT ANTECUBITAL    Special Requests      BOTTLES DRAWN AEROBIC AND  ANAEROBIC Blood Culture results may not be optimal due to an excessive volume of blood received in culture bottles Performed at [facility], [address] [place], [place] [address]    Culture PENDING    Report Status PENDING  Comprehensive metabolic panel     Status: Abnormal   Collection Time: 04/17/21  4:03 AM  Result Value Ref Range   Sodium 139 135 - 145 mmol/L   Potassium 4.0 3.5 - 5.1 mmol/L   Chloride 106 98 - 111 mmol/L   CO2 25 22 - 32 mmol/L   Glucose, Bld 122 (H) 70 - 99 mg/dL    Comment: Glucose reference range applies only to samples taken after fasting for at least 8 hours.   BUN 12 8 - 23 mg/dL   Creatinine, Ser 8.29 (L) 0.44 - 1.00 mg/dL   Calcium 9.8 8.9 - 56.2 mg/dL   Total Protein 7.1 6.5 - 8.1 g/dL   Albumin 3.5 3.5 - 5.0 g/dL   AST 14 (L) 15 - 41 U/L   ALT 15 0 - 44 U/L   Alkaline Phosphatase 90 38 - 126 U/L   Total Bilirubin 0.5 0.3 - 1.2 mg/dL   GFR, Estimated >13 >08 mL/min    Comment: (NOTE) Calculated using the CKD-EPI Creatinine Equation (2021)    Anion gap 8 5 - 15    Comment: Performed at [facility], [address], [place], [place] [address]  Magnesium     Status: None   Collection Time: 04/17/21  4:03 AM  Result Value Ref Range   Magnesium 1.9 1.7 - 2.4 mg/dL    Comment: Performed at [facility], [address], [place], [place] [address]  CBC WITH DIFFERENTIAL     Status: Abnormal   Collection Time: 04/17/21  4:03 AM  Result Value Ref Range   WBC 13.1 (H) 4.0 - 10.5 K/uL   RBC 4.48 3.87 - 5.11 MIL/uL   Hemoglobin 13.9 12.0 - 15.0 g/dL   HCT 52.8 41.3 - 24.4 %   MCV 94.9 80.0 - 100.0 fL   MCH 31.0 26.0 - 34.0 pg   MCHC 32.7 30.0 - 36.0 g/dL   RDW 01.0 27.2 - 53.6 %   Platelets 219 150 - 400 K/uL   nRBC 0.0 0.0 - 0.2 %   Neutrophils Relative % 82 %   Neutro Abs 10.8 (H) 1.7 - 7.7 K/uL   Lymphocytes Relative 11 %   Lymphs Abs 1.4 0.7 - 4.0 K/uL   Monocytes Relative 5 %   Monocytes Absolute 0.6 0.1 - 1.0 K/uL   Eosinophils Relative  2 %   Eosinophils Absolute 0.2 0.0 - 0.5 K/uL   Basophils Relative 0 %   Basophils Absolute 0.0 0.0 - 0.1 K/uL   Immature Granulocytes 0 %   Abs Immature Granulocytes 0.05 0.00 - 0.07 K/uL    Comment: Performed at [name], [address], [place], [place] [address]    Chemistries  Recent Labs  Lab 04/16/21 2257 04/17/21 0403  NA 139 139  K 3.6 4.0  CL 105 106  CO2 27 25  GLUCOSE 171* 122*  BUN 12 12  CREATININE 0.44 0.37*  CALCIUM 10.3 9.8  MG  --  1.9  AST 17 14*  ALT 16 15  ALKPHOS 106 90  BILITOT 0.1* 0.5   ------------------------------------------------------------------------------------------------------------------  ------------------------------------------------------------------------------------------------------------------ GFR: Estimated Creatinine Clearance: 74.5 mL/min (A) (by C-G formula based on SCr of 0.37 mg/dL (L)). Liver Function Tests: Recent Labs  Lab 04/16/21 2257 04/17/21 0403  AST 17 14*  ALT 16 15  ALKPHOS 106 90  BILITOT 0.1* 0.5  PROT 7.8 7.1  ALBUMIN 3.8 3.5   No results for input(s): LIPASE, AMYLASE in the last 168 hours. No results  for input(s): AMMONIA in the last 168 hours. Coagulation Profile: Recent Labs  Lab 04/16/21 2257  INR 1.1   Cardiac Enzymes: No results for input(s): CKTOTAL, CKMB, CKMBINDEX, TROPONINI in the last 168 hours. BNP (last 3 results) No results for input(s): PROBNP in the last 8760 hours. HbA1C: No results for input(s): HGBA1C in the last 72 hours. CBG: No results for input(s): GLUCAP in the last 168 hours. Lipid Profile: No results for input(s): CHOL, HDL, LDLCALC, TRIG, CHOLHDL, LDLDIRECT in the last 72 hours. Thyroid Function Tests: No results for input(s): TSH, T4TOTAL, FREET4, T3FREE, THYROIDAB in the last 72 hours. Anemia Panel: No results for input(s): VITAMINB12, FOLATE, FERRITIN, TIBC, IRON, RETICCTPCT in the last 72  hours.  --------------------------------------------------------------------------------------------------------------- Urine analysis:    Component Value Date/Time   COLORURINE YELLOW 02/11/2019 0332   APPEARANCEUR CLEAR 02/11/2019 0332   LABSPEC 1.035 (H) 02/11/2019 0332   PHURINE 5.0 02/11/2019 0332   GLUCOSEU >=500 (A) 02/11/2019 0332   HGBUR SMALL (A) 02/11/2019 0332   BILIRUBINUR NEGATIVE 02/11/2019 0332   KETONESUR NEGATIVE 02/11/2019 0332   PROTEINUR NEGATIVE 02/11/2019 0332   NITRITE NEGATIVE 02/11/2019 0332   LEUKOCYTESUR MODERATE (A) 02/11/2019 0332      Imaging Results:    DG Chest Port 1 View  Result Date: 04/16/2021 CLINICAL DATA:  Questionable sepsis. EXAM: PORTABLE CHEST 1 VIEW COMPARISON:  Chest x-ray 11/23/2019 FINDINGS: The heart size and mediastinal contours are within normal limits. Both lungs are clear. The visualized skeletal structures are unremarkable. IMPRESSION: No active disease. Electronically Signed   By: Darliss Cheney M.D.   On: 04/16/2021 22:53   DG Foot Complete Left  Result Date: 04/16/2021 CLINICAL DATA:  Bilateral foot infection. EXAM: LEFT FOOT - COMPLETE 3+ VIEW COMPARISON:  None. FINDINGS: There is no evidence of acute fracture or dislocation. A radiopaque fixation plate and screws are seen along the visualized portion of the left lateral malleolus. There is a large plantar calcaneal spur. Moderate severity degenerative changes are noted along the dorsal aspect of the proximal to mid left foot. Soft tissues are unremarkable. IMPRESSION: 1. Degenerative changes without evidence of an acute osseous abnormality. 2. Prior ORIF of the left lateral malleolus. 3. Large plantar calcaneal spur. Electronically Signed   By: Aram Candela M.D.   On: 04/16/2021 23:24   DG Foot Complete Right  Result Date: 04/16/2021 CLINICAL DATA:  Bilateral foot infection. EXAM: RIGHT FOOT COMPLETE - 3+ VIEW COMPARISON:  None. FINDINGS: There is no evidence of acute  fracture or dislocation. A chronic appearing deformity is seen involving the distal aspect of the proximal phalanx of the fourth right toe. There is a moderate-sized plantar calcaneal spur. Moderate severity degenerative changes are seen along the dorsal aspect of the mid right foot. Mild soft tissue swelling is seen along the dorsal aspect of the mid to distal right foot. IMPRESSION: Chronic and degenerative changes, as described above, without evidence of acute osseous abnormality. Electronically Signed   By: Aram Candela M.D.   On: 04/16/2021 23:23       Assessment & Plan:    Principal Problem:   Diabetic foot ulcer (HCC) Active Problems:   Diabetes mellitus type II, non insulin dependent (HCC)   Essential hypertension   Diabetic foot ulcer Continue vancomycin and Zosyn Inflammatory markers elevated, high suspicion for osteomyelitis MRI in the a.m. of the left foot Consult general surgery No ABI ordered as patient just had them done in July see HPI Currently n.p.o. in case of  surgery Diabetes mellitus type 2 Check hemoglobin A1c N.p.o. Sliding scale coverage Continue to monitor Hypertension Continue ACE Tobacco use disorder Advised on importance of cessation especially in the setting of likely peripheral vascular disease Patient is precontemplative Patient declines nicotine patch at this time    DVT Prophylaxis-   Heparin - SCDs   AM Labs Ordered, also please review Full Orders  Family Communication: No family at bedside  Code Status: Full  Admission status: Inpatient :The appropriate admission status for this patient is INPATIENT. Inpatient status is judged to be reasonable and necessary in order to provide the required intensity of service to ensure the patient's safety. The patient's presenting symptoms, physical exam findings, and initial radiographic and laboratory data in the context of their chronic comorbidities is felt to place them at high risk for further  clinical deterioration. Furthermore, it is not anticipated that the patient will be medically stable for discharge from the hospital within 2 midnights of admission. The following factors support the admission status of inpatient.     The patient's presenting symptoms include swollen foot. The worrisome physical exam findings include blisters and erythema bilateral feet. The initial radiographic and laboratory data are worrisome because of leukocytosis. The chronic co-morbidities include diabetes mellitus type 2.       * I certify that at the point of admission it is my clinical judgment that the patient will require inpatient hospital care spanning beyond 2 midnights from the point of admission due to high intensity of service, high risk for further deterioration and high frequency of surveillance required.*  Disposition: Anticipated Discharge date 72 discharge to home  Time spent in minutes : 65   Gustavo Meditz B Zierle-Ghosh DO

## 2021-04-17 NOTE — ED Notes (Signed)
Patient to MRI at this time.

## 2021-04-18 DIAGNOSIS — L03119 Cellulitis of unspecified part of limb: Secondary | ICD-10-CM

## 2021-04-18 LAB — GLUCOSE, CAPILLARY
Glucose-Capillary: 96 mg/dL (ref 70–99)
Glucose-Capillary: 122 mg/dL — ABNORMAL HIGH (ref 70–99)
Glucose-Capillary: 122 mg/dL — ABNORMAL HIGH (ref 70–99)
Glucose-Capillary: 169 mg/dL — ABNORMAL HIGH (ref 70–99)

## 2021-04-18 MED ORDER — DOCUSATE SODIUM 100 MG PO CAPS
100.0000 mg | ORAL_CAPSULE | Freq: Two times a day (BID) | ORAL | Status: DC
  Administered 2021-04-18 – 2021-04-29 (×22): 100 mg via ORAL
  Filled 2021-04-18 (×22): qty 1

## 2021-04-18 MED ORDER — LORAZEPAM 0.5 MG PO TABS
0.5000 mg | ORAL_TABLET | Freq: Two times a day (BID) | ORAL | Status: DC | PRN
  Administered 2021-04-23: 16:00:00 0.5 mg via ORAL
  Filled 2021-04-18: qty 1

## 2021-04-18 MED ORDER — POLYETHYLENE GLYCOL 3350 17 G PO PACK
17.0000 g | PACK | Freq: Every day | ORAL | Status: DC | PRN
  Administered 2021-04-24 – 2021-04-25 (×2): 17 g via ORAL
  Filled 2021-04-18 (×3): qty 1

## 2021-04-18 MED ORDER — MUPIROCIN CALCIUM 2 % EX CREA
TOPICAL_CREAM | Freq: Two times a day (BID) | CUTANEOUS | Status: DC
  Administered 2021-04-20 – 2021-04-21 (×2): 1 via TOPICAL
  Filled 2021-04-18: qty 15

## 2021-04-18 NOTE — Progress Notes (Signed)
PROGRESS NOTE    Maria Leblanc  WGN:[id number] DOB: 05/25/1955 DOA: 04/16/2021 PCP: Golden Pop, FNP    Chief Complaint  Patient presents with   Wound Infection    Brief Narrative:  As per H&P written byDr. Carren Rang on 04/17/21  Maria Leblanc  is a 65 y.o. female, with history of diabetes mellitus type 2, hypothyroid, MR presents to ED with a chief complaint of blisters on feet.  Patient reports that started few days ago.  She is not exactly sure when.  She did present to her PCP on Tuesday.  She reports she was given a shot of some medication that she is not sure of in the office, and then started on doxycycline.  She was told that if her pain became worse come in the ER.  Her left toe then swelled up and so she came into the ER today.  Patient reports that the blisters initially started on her right foot in her third and fourth toes.  They radiated to her right forefoot.  She could not walk because it was so painful.  She did take days off work.  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  On the same day of presentation her blisters noted to her left toe.  She reports her left toes become swollen and purplish.  It drains clear fluid.  Is been so painful that she has not been able to weight-bear on it.  She has had no fevers.  She had no purulent drainage.  Patient is not sure her glucose runs as she does not check it.  She reports compliance with her oral hypoglycemics, but she does not take insulin.  She reports her last hemoglobin A1c was 7.  Patient is a current smoker.  She smokes less than a pack a day and has no intention of quitting.  She does not  drink, use illicit drugs.  She is not vaccinated for COVID.  Patient did have ABI done in July.  She is not sure why.  She does not think she has a history of peripheral vascular disease.   In the ED Patient is afebrile, heart rate is normal, respiratory rate is normal, blood pressure 174/80, satting  93% Patient leukocytosis 12.3, hemoglobin stable Chemistry panel was unremarkable Blood cultures pending Left foot x-ray shows no acute osseous abnormality, large plantar calcaneal spur Right foot x-ray shows chronic and degenerative changes as described above without evidence of acute osseous abnormality ABI in July shows normal  ankle brachial indices Patient was given Zosyn, vancomycin, LR 150 MLS per hour, fentanyl   Assessment & Plan: 1-bilateral foot cellulitis/diabetic ulcer/osteitis -Patient has been seen by general surgery with recommendations for no surgical intervention at this time. -Continue current IV antibiotics and local wound care. 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 -Patient has been educated about importance of checking her feet, using appropriate shoes and keeping skin moisturized. -Follow CBG trend/fluctuation.  3-Essential hypertension -Continue current antihypertensive regimen -Blood pressure stable/well-controlled -Heart healthy diet has been encouraged.  4-history of tobacco abuse -Nicotine patch has been declined -Cessation counseling provided.   DVT prophylaxis: Heparin Code Status: Full code. Family Communication: No family at bedside. Disposition:   Status is: Inpatient   Consultants:  General surgery  Procedures:  See below for x-ray reports.  Briefly left foot MRI demonstrating left great toe osteitis/early osteomyelitis.  Antimicrobials:  Vancomycin and Zosyn.   Subjective: Afebrile, no chest pain, no nausea, no vomiting.  Still complaining of bilateral foot pain with ongoing blister and serosanguineous discharges.  Objective: Vitals:   04/17/21 1956 04/18/21 0500 04/18/21 0513 04/18/21 1432  BP: (!)  124/93  (!) 155/72 (!) 148/84  Pulse: 63  63 66  Resp: 20  20 18   Temp: 98.1 F (36.7 C)  98.2 F (36.8 C) 98.4 F (36.9 C)  TempSrc: Oral   Oral  SpO2: 95%  94% 91%  Weight:  78.4 kg    Height:        Intake/Output Summary (Last 24 hours) at 04/18/2021 1610 Last data filed at 04/18/2021 0400 Gross per 24 hour  Intake 660.86 ml  Output 800 ml  Net -139.14 ml   Filed Weights   04/16/21 2127 04/18/21 0500  Weight: 77.1 kg 78.4 kg    Examination:  General exam: Appears calm and in no major distress; reports no chest pain, no nausea, no vomiting.  Still complaining of pain in her foot bilaterally. Respiratory system: Clear to auscultation. Respiratory effort normal.  No using accessory muscles. Cardiovascular system: S1 & S2 heard, RRR.  No rubs, no gallops, no JVD.  Trace lower extremity edema appreciated bilaterally. Gastrointestinal system: Abdomen is nondistended, soft and nontender. No organomegaly or masses felt. Normal bowel sounds heard. Central nervous system: Alert and oriented. No focal neurological deficits. Extremities/skin: No appreciated clubbing; patient with bilateral blisters/diabetic ulcer affecting both of her fluid some of them open with serosanguineous discharges.  Please refer to epic media for pictures of her lesions.  There is no rashes or petechiae appreciated.  Positive onychomycosis plan.  Lesions/wounds present at time of admission. Psychiatry: Judgement and insight appear normal. Mood & affect appropriate.     Data Reviewed: I have personally reviewed following labs and imaging studies  CBC: Recent Labs  Lab 04/16/21 2257 04/17/21 0403  WBC 12.3* 13.1*  NEUTROABS 9.4* 10.8*  HGB 14.8 13.9  HCT 44.8 42.5  MCV 95.1 94.9  PLT 250 219    Basic Metabolic Panel: Recent Labs  Lab 04/16/21 2257 04/17/21 0403  NA 139 139  K 3.6 4.0  CL 105 106  CO2 27 25  GLUCOSE 171* 122*  BUN 12 12  CREATININE 0.44 0.37*  CALCIUM 10.3 9.8  MG  --   1.9    GFR: Estimated Creatinine Clearance: 75 mL/min (A) (by C-G formula based on SCr of 0.37 mg/dL (L)).  Liver Function Tests: Recent Labs  Lab 04/16/21 2257 04/17/21 0403  AST 17 14*  ALT 16 15  ALKPHOS 106 90  BILITOT 0.1* 0.5  PROT 7.8 7.1  ALBUMIN 3.8 3.5    CBG: Recent Labs  Lab 04/17/21 1122 04/17/21 1644 04/17/21 1957 04/18/21 0734 04/18/21 1119  GLUCAP 107* 120* 138* 96 122*     Recent Results (from the past 240 hour(s))  Blood Culture (routine x 2)     Status: None (Preliminary result)   Collection Time: 04/16/21 10:57 PM   Specimen: Right Antecubital; Blood  Result Value Ref Range Status   Specimen Description RIGHT ANTECUBITAL  Final   Special Requests   Final    BOTTLES DRAWN AEROBIC AND ANAEROBIC Blood Culture results may not be optimal due to an excessive volume of blood received in culture bottles   Culture   Final  NO GROWTH 2 DAYS Performed at [facility], [address] [place], [place] [address]    Report Status PENDING  Incomplete  Blood Culture (routine x 2)     Status: None (Preliminary result)   Collection Time: 04/16/21 10:57 PM   Specimen: Left Antecubital; Blood  Result Value Ref Range Status   Specimen Description LEFT ANTECUBITAL  Final   Special Requests   Final    BOTTLES DRAWN AEROBIC AND ANAEROBIC Blood Culture results may not be optimal due to an excessive volume of blood received in culture bottles   Culture   Final    NO GROWTH 2 DAYS Performed at [facility], [address], [place], [place] [address]    Report Status PENDING  Incomplete    Radiology Studies: MR FOOT LEFT W WO CONTRAST  Result Date: 04/17/2021 CLINICAL DATA:  Foot swelling, nondiabetic, osteomyelitis suspected EXAM: MRI OF THE LEFT FOREFOOT WITHOUT AND WITH CONTRAST TECHNIQUE: Multiplanar, multisequence MR imaging of the left forefoot was performed both before and after administration of intravenous contrast. CONTRAST:  51mL GADAVIST GADOBUTROL 1  MMOL/ML IV SOLN COMPARISON:  X-ray 04/16/2021 FINDINGS: Technical Note: Despite efforts by the technologist and patient, motion artifact is present on today's exam and could not be eliminated. There is also poor fat saturation affecting the toes near the edge of the field of view. This reduces exam sensitivity and specificity. Bones/Joint/Cartilage No acute fracture. No malalignment. No focal erosion or site of bone destruction. Faint marrow edema within the distal phalanx of the left great toe. Preservation of the fatty T1 marrow signal. Mild degenerative changes of the forefoot, most notably involving the tarsometatarsal joints and first metatarsal-phalangeal joint. No effusion. Ligaments Intact Lisfranc ligament. Collateral ligaments of the forefoot are intact. Muscles and Tendons Fatty atrophy of the abductor digiti minimi muscle. Otherwise preserved muscle bulk. Multifocal areas of nodular thickening of the plantar fascia likely representing fibromatosis. Intact flexor and extensor tendons. No tenosynovitis. Soft tissues Focal soft tissue swelling with edema and enhancement along the medial aspect of the great toe with prominent overlying cutaneous blister. No organized fluid collection within the soft tissues. There are multiple additional smaller cutaneous blisters predominantly along the plantar surface of the forefoot. IMPRESSION: 1. Cellulitis of the left great toe with prominent overlying cutaneous blister. Faint marrow edema within the distal phalanx of the left great toe without focal erosion, which may represent reactive osteitis versus early acute osteomyelitis. 2. Multiple additional smaller cutaneous blisters predominantly along the plantar surface of the forefoot. 3. 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 4. 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 Electronically Signed   By: Duanne Guess D.O.   On:  04/17/2021 08:53   DG Chest Port 1 View  Result Date: 04/16/2021 CLINICAL DATA:  Questionable sepsis. EXAM: PORTABLE CHEST 1 VIEW COMPARISON:  Chest x-ray 11/23/2019 FINDINGS: The heart size and mediastinal contours are within normal limits. Both lungs are clear. The visualized skeletal structures are unremarkable. IMPRESSION: No active disease. Electronically Signed   By: Darliss Cheney M.D.   On: 04/16/2021 22:53   DG Foot Complete Left  Result Date: 04/16/2021 CLINICAL DATA:  Bilateral foot infection. EXAM: LEFT FOOT - COMPLETE 3+ VIEW COMPARISON:  None. FINDINGS: There is no evidence of acute fracture or dislocation. A radiopaque fixation plate and screws are seen along the visualized portion of the left lateral malleolus. There is a large plantar calcaneal spur. Moderate severity  degenerative changes are noted along the dorsal aspect of the proximal to mid left foot. Soft tissues are unremarkable. IMPRESSION: 1. Degenerative changes without evidence of an acute osseous abnormality. 2. Prior ORIF of the left lateral malleolus. 3. Large plantar calcaneal spur. Electronically Signed   By: Aram Candela M.D.   On: 04/16/2021 23:24   DG Foot Complete Right  Result Date: 04/16/2021 CLINICAL DATA:  Bilateral foot infection. EXAM: RIGHT FOOT COMPLETE - 3+ VIEW COMPARISON:  None. FINDINGS: There is no evidence of acute fracture or dislocation. A chronic appearing deformity is seen involving the distal aspect of the proximal phalanx of the fourth right toe. There is a moderate-sized plantar calcaneal spur. Moderate severity degenerative changes are seen along the dorsal aspect of the mid right foot. Mild soft tissue swelling is seen along the dorsal aspect of the mid to distal right foot. IMPRESSION: Chronic and degenerative changes, as described above, without evidence of acute osseous abnormality. Electronically Signed   By: Aram Candela M.D.   On: 04/16/2021 23:23    Scheduled Meds:   busPIRone  5 mg Oral Daily   heparin  5,000 Units Subcutaneous Q8H   insulin aspart  0-15 Units Subcutaneous TID WC   insulin aspart  0-5 Units Subcutaneous QHS   lisinopril  40 mg Oral Daily   mupirocin cream   Topical BID   Continuous Infusions:  piperacillin-tazobactam (ZOSYN)  IV 3.375 g (04/18/21 1423)   vancomycin 750 mg (04/18/21 1053)     LOS: 1 day    Vassie Loll, MD [facility]   To contact the attending provider between 7A-7P or the covering provider during after hours 7P-7A, please log into the web site [email or web address] and access using universal [place] password for that web site. If you do not have the password, please call the hospital operator.  04/18/2021, 4:10 PM

## 2021-04-18 NOTE — Progress Notes (Signed)
°  Transition of Care [facility]) Screening Note   Patient Details  Name: Maria Leblanc Date of Birth: [birth year]   Transition of Care [facility]) CM/SW Contact:    Leitha Bleak, RN Phone Number: 04/18/2021, 2:39 PM    Transition of Care Department [facility]) has reviewed patient and no TOC needs have been identified at this time. We will continue to monitor patient advancement through interdisciplinary progression rounds. If new patient transition needs arise, please place a TOC consult.

## 2021-04-18 NOTE — Consult Note (Signed)
Reason for Consult: Bilateral toe cellulitis, diabetes mellitus Referring Physician: Dr. Heidi Dach is an 65 y.o. female.  HPI: Patient is a 65 year old white female well-known to me with a history of cellulitis of multiple toes on both feet who states she was doing well until recently when she started having swelling and pain in both feet.  I last saw her in my office in July 2022.  It was occurring at various toes.  She presented to the emergency room with worsening pain and yellowish drainage.  She was told by her primary care physician to go to the emergency room if her condition worsens.  Patient was noted to have blisters on the left great toe.  She had some cyanosis of the multiple toes.  Past Medical History:  Diagnosis Date   Ankle fracture 02/18/2016   left   Borderline diabetes    Complication of anesthesia    "fighting with anesthesia when waking up"   Diabetes mellitus without complication (HCC)    Diverticulosis    Headache    History of kidney stones    Hypothyroid    no longer needs med    Past Surgical History:  Procedure Laterality Date   CHOLECYSTECTOMY N/A 03/29/2017   Procedure: LAPAROSCOPIC CHOLECYSTECTOMY;  Surgeon: Lucretia Roers, MD;  Location: [facility];  Service: General;  Laterality: N/A;   CYST REMOVAL NECK     x3   DIAGNOSTIC LAPAROSCOPY     as teenager   INCISION AND DRAINAGE PERIRECTAL ABSCESS  03/29/2017   Procedure: INCISION  AND DRAINAGE PERIRECTAL ABSCESS;  Surgeon: Lucretia Roers, MD;  Location: [facility];  Service: General;;   ORIF ANKLE FRACTURE Left 02/28/2016   Procedure: OPEN REDUCTION INTERNAL FIXATION (ORIF) ANKLE FRACTURE, LEFT;  Surgeon: Sheral Apley, MD;  Location: [facility];  Service: Orthopedics;  Laterality: Left;   TONSILLECTOMY     as child    Family History  Problem Relation Age of Onset   Diabetes Mother    Other Mother    Dementia Mother    Cancer Father    Heart attack Father    Diabetes  Father    Other Brother     Social History:  reports that she has been smoking cigarettes. She has a 10.00 pack-year smoking history. She has never used smokeless tobacco. She reports that she does not currently use alcohol. She reports that she does not use drugs.  Allergies:  Allergies  Allergen Reactions   Propofol Other (See Comments)    Patient reports "it causes me to fight them when i'm waking up"    Medications: I have reviewed the patient's current medications. Prior to Admission:  Medications Prior to Admission  Medication Sig Dispense Refill Last Dose   acetaminophen (TYLENOL) 325 MG tablet Take 650 mg by mouth every 6 (six) hours as needed.   unk   amLODipine (NORVASC) 10 MG tablet Take 1 tablet (10 mg total) by mouth daily. 30 tablet 1 04/16/2021   doxycycline (VIBRA-TABS) 100 MG tablet Take 100 mg by mouth 2 (two) times daily.   04/16/2021   empagliflozin (JARDIANCE) 10 MG TABS tablet Take by mouth daily.   04/16/2021   glipiZIDE (GLUCOTROL XL) 5 MG 24 hr tablet Take 5 mg by mouth daily.   04/16/2021   ibuprofen (ADVIL) 200 MG tablet Take 200 mg by mouth every 6 (six) hours as needed.   unk   lisinopril (ZESTRIL) 40 MG tablet Take 40  mg by mouth daily.   04/16/2021   metFORMIN (GLUCOPHAGE) 1000 MG tablet Take 1,000 mg by mouth 2 (two) times daily.   04/16/2021   dicyclomine (BENTYL) 10 MG capsule Take one capsule before meals and at bedtime as needed for abdominal pain, loose stool. (Patient not taking: Reported on 04/17/2021) 90 capsule 0 Not Taking   silver sulfADIAZINE (SILVADENE) 1 % cream Apply 1 application topically 2 (two) times daily. (Patient not taking: Reported on 04/17/2021) 50 g 0 Not Taking    Results for orders placed or performed during the hospital encounter of 04/16/21 (from the past 48 hour(s))  C-reactive protein     Status: Abnormal   Collection Time: 04/16/21 10:56 PM  Result Value Ref Range   CRP 1.5 (H) <1.0 mg/dL    Comment: Performed at [facility], [address], [place], [place] [address]  Sedimentation rate     Status: Abnormal   Collection Time: 04/16/21 10:56 PM  Result Value Ref Range   Sed Rate 40 (H) 0 - 22 mm/hr    Comment: Performed at [facility], [address], [place], [place] [address]  Lactic acid, plasma     Status: None   Collection Time: 04/16/21 10:57 PM  Result Value Ref Range   Lactic Acid, Venous 1.6 0.5 - 1.9 mmol/L    Comment: Performed at [facility], [address] [place], [place] [address]  Comprehensive metabolic panel     Status: Abnormal   Collection Time: 04/16/21 10:57 PM  Result Value Ref Range   Sodium 139 135 - 145 mmol/L   Potassium 3.6 3.5 - 5.1 mmol/L   Chloride 105 98 - 111 mmol/L   CO2 27 22 - 32 mmol/L   Glucose, Bld 171 (H) 70 - 99 mg/dL    Comment: Glucose reference range applies only to samples taken after fasting for at least 8 hours.   BUN 12 8 - 23 mg/dL   Creatinine, Ser 0.44 0.44 - 1.00 mg/dL   Calcium 10.3 8.9 - 10.3 mg/dL   Total Protein 7.8 6.5 - 8.1 g/dL   Albumin 3.8 3.5 - 5.0 g/dL   AST 17 15 - 41 U/L   ALT 16 0 - 44 U/L   Alkaline Phosphatase 106 38 - 126 U/L   Total Bilirubin 0.1 (L) 0.3 - 1.2 mg/dL   GFR, Estimated >60 >60 mL/min    Comment: (NOTE) Calculated using the CKD-EPI Creatinine Equation (2021)    Anion gap 7 5 - 15    Comment: Performed at [facility], [address], [place],  [address]  CBC WITH DIFFERENTIAL     Status: Abnormal   Collection Time: 04/16/21 10:57 PM  Result Value Ref Range   WBC 12.3 (H) 4.0 - 10.5 K/uL   RBC 4.71 3.87 - 5.11 MIL/uL   Hemoglobin 14.8 12.0 - 15.0 g/dL   HCT 44.8 36.0 - 46.0 %   MCV 95.1 80.0 - 100.0 fL   MCH 31.4 26.0 - 34.0 pg   MCHC 33.0 30.0 - 36.0 g/dL   RDW 13.1 11.5 - 15.5 %   Platelets 250 150 - 400 K/uL   nRBC 0.0 0.0 - 0.2 %   Neutrophils Relative % 78 %   Neutro Abs 9.4 (H) 1.7 - 7.7 K/uL   Lymphocytes Relative 14 %   Lymphs Abs 1.8 0.7 - 4.0 K/uL   Monocytes Relative 5 %    Monocytes Absolute 0.6 0.1 - 1.0 K/uL  Eosinophils Relative 3 %   Eosinophils Absolute 0.4 0.0 - 0.5 K/uL   Basophils Relative 0 %   Basophils Absolute 0.0 0.0 - 0.1 K/uL   Immature Granulocytes 0 %   Abs Immature Granulocytes 0.04 0.00 - 0.07 K/uL    Comment: Performed at [facility], [address], [place], [place] [address]  Protime-INR     Status: None   Collection Time: 04/16/21 10:57 PM  Result Value Ref Range   Prothrombin Time 14.3 11.4 - 15.2 seconds   INR 1.1 0.8 - 1.2    Comment: (NOTE) INR goal varies based on device and disease states. Performed at [facility], [address] [place], [place] [address]   APTT     Status: None   Collection Time: 04/16/21 10:57 PM  Result Value Ref Range   aPTT 25 24 - 36 seconds    Comment: Performed at [facility], [address], [place], [place] [address]  Blood Culture (routine x 2)     Status: None (Preliminary result)   Collection Time: 04/16/21 10:57 PM   Specimen: Right Antecubital; Blood  Result Value Ref Range   Specimen Description RIGHT ANTECUBITAL    Special Requests      BOTTLES DRAWN AEROBIC AND ANAEROBIC Blood Culture results may not be optimal due to an excessive volume of blood received in culture bottles   Culture      NO GROWTH 2 DAYS Performed at [facility], [address], [place], [place] [address]    Report Status PENDING   Blood Culture (routine x 2)     Status: None (Preliminary result)   Collection Time: 04/16/21 10:57 PM   Specimen: Left Antecubital; Blood  Result Value Ref Range   Specimen Description LEFT ANTECUBITAL    Special Requests      BOTTLES DRAWN AEROBIC AND ANAEROBIC Blood Culture results may not be optimal due to an excessive volume of blood received in culture bottles   Culture      NO GROWTH 2 DAYS Performed at [facility], [address] [place], [place] [address]    Report Status PENDING   Hemoglobin A1c     Status: Abnormal   Collection Time: 04/17/21  4:03 AM  Result Value  Ref Range   Hgb A1c MFr Bld 7.1 (H) 4.8 - 5.6 %    Comment: (NOTE) Pre diabetes:          5.7%-6.4%  Diabetes:              >6.4%  Glycemic control for   <7.0% adults with diabetes    Mean Plasma Glucose 157.07 mg/dL    Comment: Performed at [facility], [place] [address] [place],  [address]  Comprehensive metabolic panel     Status: Abnormal   Collection Time: 04/17/21  4:03 AM  Result Value Ref Range   Sodium 139 135 - 145 mmol/L   Potassium 4.0 3.5 - 5.1 mmol/L   Chloride 106 98 - 111 mmol/L   CO2 25 22 - 32 mmol/L   Glucose, Bld 122 (H) 70 - 99 mg/dL    Comment: Glucose reference range applies only to samples taken after fasting for at least 8 hours.   BUN 12 8 - 23 mg/dL   Creatinine, Ser 0.37 (L) 0.44 - 1.00 mg/dL   Calcium 9.8 8.9 - 10.3 mg/dL   Total Protein 7.1 6.5 - 8.1 g/dL   Albumin 3.5 3.5 - 5.0 g/dL   AST 14 (  L) 15 - 41 U/L   ALT 15 0 - 44 U/L   Alkaline Phosphatase 90 38 - 126 U/L   Total Bilirubin 0.5 0.3 - 1.2 mg/dL   GFR, Estimated >48 >25 mL/min    Comment: (NOTE) Calculated using the CKD-EPI Creatinine Equation (2021)    Anion gap 8 5 - 15    Comment: Performed at [facility], [address] [place], [place] [address]  Magnesium     Status: None   Collection Time: 04/17/21  4:03 AM  Result Value Ref Range   Magnesium 1.9 1.7 - 2.4 mg/dL    Comment: Performed at [facility], [address], [place], [place] [address]  CBC WITH DIFFERENTIAL     Status: Abnormal   Collection Time: 04/17/21  4:03 AM  Result Value Ref Range   WBC 13.1 (H) 4.0 - 10.5 K/uL   RBC 4.48 3.87 - 5.11 MIL/uL   Hemoglobin 13.9 12.0 - 15.0 g/dL   HCT 16.9 45.0 - 38.8 %   MCV 94.9 80.0 - 100.0 fL   MCH 31.0 26.0 - 34.0 pg   MCHC 32.7 30.0 - 36.0 g/dL   RDW 82.8 00.3 - 49.1 %   Platelets 219 150 - 400 K/uL   nRBC 0.0 0.0 - 0.2 %   Neutrophils Relative % 82 %   Neutro Abs 10.8 (H) 1.7 - 7.7 K/uL   Lymphocytes Relative 11 %   Lymphs Abs 1.4 0.7 - 4.0 K/uL    Monocytes Relative 5 %   Monocytes Absolute 0.6 0.1 - 1.0 K/uL   Eosinophils Relative 2 %   Eosinophils Absolute 0.2 0.0 - 0.5 K/uL   Basophils Relative 0 %   Basophils Absolute 0.0 0.0 - 0.1 K/uL   Immature Granulocytes 0 %   Abs Immature Granulocytes 0.05 0.00 - 0.07 K/uL    Comment: Performed at [facility], [address] [place], [place] [address]  TSH     Status: None   Collection Time: 04/17/21  6:11 AM  Result Value Ref Range   TSH 1.776 0.350 - 4.500 uIU/mL    Comment: Performed by a 3rd Generation assay with a functional sensitivity of <=0.01 uIU/mL. Performed at [facility], [address], [place], [place] [address]   CBG monitoring, ED     Status: Abnormal   Collection Time: 04/17/21  7:48 AM  Result Value Ref Range   Glucose-Capillary 104 (H) 70 - 99 mg/dL    Comment: Glucose reference range applies only to samples taken after fasting for at least 8 hours.  Glucose, capillary     Status: Abnormal   Collection Time: 04/17/21 11:22 AM  Result Value Ref Range   Glucose-Capillary 107 (H) 70 - 99 mg/dL    Comment: Glucose reference range applies only to samples taken after fasting for at least 8 hours.  Glucose, capillary     Status: Abnormal   Collection Time: 04/17/21  4:44 PM  Result Value Ref Range   Glucose-Capillary 120 (H) 70 - 99 mg/dL    Comment: Glucose reference range applies only to samples taken after fasting for at least 8 hours.  Glucose, capillary     Status: Abnormal   Collection Time: 04/17/21  7:57 PM  Result Value Ref Range   Glucose-Capillary 138 (H) 70 - 99 mg/dL    Comment: Glucose reference range applies only to samples taken after fasting for at least 8 hours.  Glucose, capillary     Status: None  Collection Time: 04/18/21  7:34 AM  Result Value Ref Range   Glucose-Capillary 96 70 - 99 mg/dL    Comment: Glucose reference range applies only to samples taken after fasting for at least 8 hours.  Glucose, capillary     Status: Abnormal    Collection Time: 04/18/21 11:19 AM  Result Value Ref Range   Glucose-Capillary 122 (H) 70 - 99 mg/dL    Comment: Glucose reference range applies only to samples taken after fasting for at least 8 hours.    MR FOOT LEFT W WO CONTRAST  Result Date: 04/17/2021 CLINICAL DATA:  Foot swelling, nondiabetic, osteomyelitis suspected EXAM: MRI OF THE LEFT FOREFOOT WITHOUT AND WITH CONTRAST TECHNIQUE: Multiplanar, multisequence MR imaging of the left forefoot was performed both before and after administration of intravenous contrast. CONTRAST:  38mL GADAVIST GADOBUTROL 1 MMOL/ML IV SOLN COMPARISON:  X-ray 04/16/2021 FINDINGS: Technical Note: Despite efforts by the technologist and patient, motion artifact is present on today's exam and could not be eliminated. There is also poor fat saturation affecting the toes near the edge of the field of view. This reduces exam sensitivity and specificity. Bones/Joint/Cartilage No acute fracture. No malalignment. No focal erosion or site of bone destruction. Faint marrow edema within the distal phalanx of the left great toe. Preservation of the fatty T1 marrow signal. Mild degenerative changes of the forefoot, most notably involving the tarsometatarsal joints and first metatarsal-phalangeal joint. No effusion. Ligaments Intact Lisfranc ligament. Collateral ligaments of the forefoot are intact. Muscles and Tendons Fatty atrophy of the abductor digiti minimi muscle. Otherwise preserved muscle bulk. Multifocal areas of nodular thickening of the plantar fascia likely representing fibromatosis. Intact flexor and extensor tendons. No tenosynovitis. Soft tissues Focal soft tissue swelling with edema and enhancement along the medial aspect of the great toe with prominent overlying cutaneous blister. No organized fluid collection within the soft tissues. There are multiple additional smaller cutaneous blisters predominantly along the plantar surface of the forefoot. IMPRESSION: 1.  Cellulitis of the left great toe with prominent overlying cutaneous blister. Faint marrow edema within the distal phalanx of the left great toe without focal erosion, which may represent reactive osteitis versus early acute osteomyelitis. 2. Multiple additional smaller cutaneous blisters predominantly along the plantar surface of the forefoot. 3. 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 4. 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 Electronically Signed   By: Davina Poke D.O.   On: 04/17/2021 08:53   DG Chest Port 1 View  Result Date: 04/16/2021 CLINICAL DATA:  Questionable sepsis. EXAM: PORTABLE CHEST 1 VIEW COMPARISON:  Chest x-ray 11/23/2019 FINDINGS: The heart size and mediastinal contours are within normal limits. Both lungs are clear. The visualized skeletal structures are unremarkable. IMPRESSION: No active disease. Electronically Signed   By: Ronney Asters M.D.   On: 04/16/2021 22:53   DG Foot Complete Left  Result Date: 04/16/2021 CLINICAL DATA:  Bilateral foot infection. EXAM: LEFT FOOT - COMPLETE 3+ VIEW COMPARISON:  None. FINDINGS: There is no evidence of acute fracture or dislocation. A radiopaque fixation plate and screws are seen along the visualized portion of the left lateral malleolus. There is a large plantar calcaneal spur. Moderate severity degenerative changes are noted along the dorsal aspect of the proximal to mid left foot. Soft tissues are unremarkable. IMPRESSION: 1. Degenerative changes without evidence of an acute osseous abnormality. 2. Prior ORIF of the left lateral malleolus. 3. Large plantar calcaneal spur. Electronically  Signed   By: Virgina Norfolk M.D.   On: 04/16/2021 23:24   DG Foot Complete Right  Result Date: 04/16/2021 CLINICAL DATA:  Bilateral foot infection. EXAM: RIGHT FOOT COMPLETE - 3+ VIEW COMPARISON:  None. FINDINGS: There is no evidence of acute fracture or  dislocation. A chronic appearing deformity is seen involving the distal aspect of the proximal phalanx of the fourth right toe. There is a moderate-sized plantar calcaneal spur. Moderate severity degenerative changes are seen along the dorsal aspect of the mid right foot. Mild soft tissue swelling is seen along the dorsal aspect of the mid to distal right foot. IMPRESSION: Chronic and degenerative changes, as described above, without evidence of acute osseous abnormality. Electronically Signed   By: Virgina Norfolk M.D.   On: 04/16/2021 23:23    ROS:  Pertinent items are noted in HPI.  Blood pressure (!) 155/72, pulse 63, temperature 98.2 F (36.8 C), resp. rate 20, height 5\' 6"  (1.676 m), weight 78.4 kg, SpO2 94 %. Physical Exam: Pleasant white female no acute distress Head is normocephalic, atraumatic Lungs clear to auscultation with good breath sounds bilaterally Heart examination reveals regular rate and rhythm without S3, S4, murmurs Extremity: Bilateral femoral, dorsalis pedis, and posterior tibial pulses palpable.  Multiple toes with superficial ulcerations, erythema, and blisters.  Please see pictures below.   Media Information Document Information  Photos    04/18/2021 12:57  Attached To:  Hospital Encounter on 04/16/21   Source Information  Aviva Signs, MD   [facility]     Media Information Document Information  Photos    04/18/2021 12:57  Attached To:  Hospital Encounter on 04/16/21   Source Information  Aviva Signs, MD   [facility]     Assessment/Plan: Impression: Multiple diabetic foot ulcers bilaterally, bilateral cellulitis. Plan: Agree with IV antibiotics and local wound care.  No need for acute operative intervention at this point.  We will follow with you.  Aviva Signs 04/18/2021, 12:47 PM

## 2021-04-19 LAB — GLUCOSE, CAPILLARY
Glucose-Capillary: 143 mg/dL — ABNORMAL HIGH (ref 70–99)
Glucose-Capillary: 118 mg/dL — ABNORMAL HIGH (ref 70–99)
Glucose-Capillary: 149 mg/dL — ABNORMAL HIGH (ref 70–99)
Glucose-Capillary: 154 mg/dL — ABNORMAL HIGH (ref 70–99)
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
## 2021-04-19 NOTE — Progress Notes (Signed)
PROGRESS NOTE    Maria Leblanc  HBZ:[id number] DOB: Aug 04, 1955 DOA: 04/16/2021 PCP: Golden Pop, FNP    Chief Complaint  Patient presents with   Wound Infection    Brief Narrative:  As per H&P written byDr. Carren Rang on 04/17/21  Maria Leblanc  is a 65 y.o. female, with history of diabetes mellitus type 2, hypothyroid, MR presents to ED with a chief complaint of blisters on feet.  Patient reports that started few days ago.  She is not exactly sure when.  She did present to her PCP on Tuesday.  She reports she was given a shot of some medication that she is not sure of in the office, and then started on doxycycline.  She was told that if her pain became worse come in the ER.  Her left toe then swelled up and so she came into the ER today.  Patient reports that the blisters initially started on her right foot in her third and fourth toes.  They radiated to her right forefoot.  She could not walk because it was so painful.  She did take days off work.  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  On the same day of presentation her blisters noted to her left toe.  She reports her left toes become swollen and purplish.  It drains clear fluid.  Is been so painful that she has not been able to weight-bear on it.  She has had no fevers.  She had no purulent drainage.  Patient is not sure her glucose runs as she does not check it.  She reports compliance with her oral hypoglycemics, but she does not take insulin.  She reports her last hemoglobin A1c was 7.  Patient is a current smoker.  She smokes less than a pack a day and has no intention of quitting.  She does not  drink, use illicit drugs.  She is not vaccinated for COVID.  Patient did have ABI done in July.  She is not sure why.  She does not think she has a history of peripheral vascular disease.   In the ED Patient is afebrile, heart rate is normal, respiratory rate is normal, blood pressure 174/80, satting  93% Patient leukocytosis 12.3, hemoglobin stable Chemistry panel was unremarkable Blood cultures pending Left foot x-ray shows no acute osseous abnormality, large plantar calcaneal spur Right foot x-ray shows chronic and degenerative changes as described above without evidence of acute osseous abnormality ABI in July shows normal  ankle brachial indices Patient was given Zosyn, vancomycin, LR 150 MLS per hour, fentanyl   Assessment & Plan: 1-bilateral foot cellulitis/diabetic ulcer/osteitis -Patient has been seen by general surgery with recommendations for no surgical intervention at this time. -Continue current IV antibiotics and local wound care for another 24 hours; plan is to discharge patient after that on doxycycline and Keflex with further wound care and outpatient follow-up with general surgery.. 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 -Patient has been educated about importance of checking her feet, using appropriate shoes and keeping skin moisturized. -Continue to follow CBG trend/fluctuation.  3-Essential hypertension -Continue current antihypertensive regimen -Blood pressure stable/well-controlled -Heart healthy diet has been encouraged.  4-history of tobacco abuse -Nicotine patch has been declined -  Cessation counseling provided.   DVT prophylaxis: Heparin Code Status: Full code. Family Communication: No family at bedside. Disposition:   Status is: Inpatient   Consultants:  General surgery  Procedures:  See below for x-ray reports.  Briefly left foot MRI demonstrating left great toe osteitis/early osteomyelitis.  Antimicrobials:  Vancomycin and Zosyn.   Subjective: No fever, no chest pain,  no nausea, no vomiting.  Reports pain in her feet are better.  Decrease swelling and drainage appreciated out of her left great toe and open diabetic ulcers affecting fourth and fifth right toes.  Objective: Vitals:   04/18/21 2134 04/19/21 0415 04/19/21 0500 04/19/21 1426  BP: (!) 153/72 (!) 154/83  (!) 157/70  Pulse: (!) 58 64  61  Resp: 18 18  18   Temp: 98.5 F (36.9 C) 97.8 F (36.6 C)  98.6 F (37 C)  TempSrc:    Oral  SpO2: 93% 96%  98%  Weight:   77.1 kg   Height:        Intake/Output Summary (Last 24 hours) at 04/19/2021 1536 Last data filed at 04/19/2021 0500 Gross per 24 hour  Intake 240 ml  Output --  Net 240 ml   Filed Weights   04/16/21 2127 04/18/21 0500 04/19/21 0500  Weight: 77.1 kg 78.4 kg 77.1 kg    Examination: General exam: Alert, awake, oriented x 3; cooperative and in no distress.  Currently afebrile. Respiratory system: Clear to auscultation. Respiratory effort normal.  Good saturation on room air. Cardiovascular system:RRR. No murmurs, rubs, gallops. Gastrointestinal system: Abdomen is nondistended, soft and nontender. No organomegaly or masses felt. Normal bowel sounds heard. Central nervous system: Alert and oriented. No focal neurological deficits. Extremities/skin: No cyanosis or clubbing; bilateral blisters/wounds in her food demonstrating improvement, currently dry without active suppuration and less painful. Psychiatry: Judgement and insight appear normal. Mood & affect appropriate.    Data Reviewed: I have personally reviewed following labs and imaging studies  CBC: Recent Labs  Lab 04/16/21 2257 04/17/21 0403  WBC 12.3* 13.1*  NEUTROABS 9.4* 10.8*  HGB 14.8 13.9  HCT 44.8 42.5  MCV 95.1 94.9  PLT 250 219    Basic Metabolic Panel: Recent Labs  Lab 04/16/21 2257 04/17/21 0403  NA 139 139  K 3.6 4.0  CL 105 106  CO2 27 25  GLUCOSE 171* 122*  BUN 12 12  CREATININE 0.44 0.37*  CALCIUM 10.3 9.8  MG  --  1.9     GFR: Estimated Creatinine Clearance: 74.5 mL/min (A) (by C-G formula based on SCr of 0.37 mg/dL (L)).  Liver Function Tests: Recent Labs  Lab 04/16/21 2257 04/17/21 0403  AST 17 14*  ALT 16 15  ALKPHOS 106 90  BILITOT 0.1* 0.5  PROT 7.8 7.1  ALBUMIN 3.8 3.5    CBG: Recent Labs  Lab 04/18/21 1119 04/18/21 1623 04/18/21 2134 04/19/21 0757 04/19/21 1214  GLUCAP 122* 122* 169* 143* 118*     Recent Results (from the past 240 hour(s))  Blood Culture (routine x 2)     Status: None (Preliminary result)   Collection Time: 04/16/21 10:57 PM   Specimen: Right Antecubital; Blood  Result Value Ref Range Status   Specimen Description RIGHT ANTECUBITAL  Final   Special Requests   Final    BOTTLES DRAWN AEROBIC AND ANAEROBIC Blood Culture results may not be optimal due to an excessive volume of blood received in culture bottles   Culture   Final    NO GROWTH 3  DAYS Performed at [facility], [address] [place], [place] [address]    Report Status PENDING  Incomplete  Blood Culture (routine x 2)     Status: None (Preliminary result)   Collection Time: 04/16/21 10:57 PM   Specimen: Left Antecubital; Blood  Result Value Ref Range Status   Specimen Description LEFT ANTECUBITAL  Final   Special Requests   Final    BOTTLES DRAWN AEROBIC AND ANAEROBIC Blood Culture results may not be optimal due to an excessive volume of blood received in culture bottles   Culture   Final    NO GROWTH 3 DAYS Performed at [facility], [address] [place], [place] [address]    Report Status PENDING  Incomplete    Radiology Studies: No results found.  Scheduled Meds:  docusate sodium  100 mg Oral BID   heparin  5,000 Units Subcutaneous Q8H   insulin aspart  0-15 Units Subcutaneous TID WC   insulin aspart  0-5 Units Subcutaneous QHS   lisinopril  40 mg Oral Daily   mupirocin cream   Topical BID   Continuous Infusions:  piperacillin-tazobactam (ZOSYN)  IV 3.375 g (04/19/21 1420)    vancomycin 750 mg (04/19/21 1037)     LOS: 2 days    Vassie Loll, MD [facility]   To contact the attending provider between 7A-7P or the covering provider during after hours 7P-7A, please log into the web site [email or web address] and access using universal [place] password for that web site. If you do not have the password, please call the hospital operator.  04/19/2021, 3:36 PM

## 2021-04-19 NOTE — Progress Notes (Signed)
Pts wounds cleansed with soap and water and ointment  was applied.

## 2021-04-20 LAB — GLUCOSE, CAPILLARY
Glucose-Capillary: 171 mg/dL — ABNORMAL HIGH (ref 70–99)
Glucose-Capillary: 135 mg/dL — ABNORMAL HIGH (ref 70–99)
Glucose-Capillary: 118 mg/dL — ABNORMAL HIGH (ref 70–99)
Glucose-Capillary: 159 mg/dL — ABNORMAL HIGH (ref 70–99)

## 2021-04-20 MED ORDER — OXYCODONE HCL 5 MG PO TABS
5.0000 mg | ORAL_TABLET | Freq: Four times a day (QID) | ORAL | Status: DC | PRN
  Administered 2021-04-20 – 2021-04-28 (×7): 5 mg via ORAL
  Filled 2021-04-20 (×7): qty 1

## 2021-04-20 NOTE — Progress Notes (Signed)
PROGRESS NOTE    Maria Leblanc  DVV:[id number] DOB: 1955/09/06 DOA: 04/16/2021 PCP: Golden Pop, FNP    Chief Complaint  Patient presents with   Wound Infection    Brief Narrative:  As per H&P written byDr. Carren Rang on 04/17/21  Maria Leblanc  is a 65 y.o. female, with history of diabetes mellitus type 2, hypothyroid, MR presents to ED with a chief complaint of blisters on feet.  Patient reports that started few days ago.  She is not exactly sure when.  She did present to her PCP on Tuesday.  She reports she was given a shot of some medication that she is not sure of in the office, and then started on doxycycline.  She was told that if her pain became worse come in the ER.  Her left toe then swelled up and so she came into the ER today.  Patient reports that the blisters initially started on her right foot in her third and fourth toes.  They radiated to her right forefoot.  She could not walk because it was so painful.  She did take days off work.  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  On the same day of presentation her blisters noted to her left toe.  She reports her left toes become swollen and purplish.  It drains clear fluid.  Is been so painful that she has not been able to weight-bear on it.  She has had no fevers.  She had no purulent drainage.  Patient is not sure her glucose runs as she does not check it.  She reports compliance with her oral hypoglycemics, but she does not take insulin.  She reports her last hemoglobin A1c was 7.  Patient is a current smoker.  She smokes less than a pack a day and has no intention of quitting.  She does not  drink, use illicit drugs.  She is not vaccinated for COVID.  Patient did have ABI done in July.  She is not sure why.  She does not think she has a history of peripheral vascular disease.   In the ED Patient is afebrile, heart rate is normal, respiratory rate is normal, blood pressure 174/80, satting  93% Patient leukocytosis 12.3, hemoglobin stable Chemistry panel was unremarkable Blood cultures pending Left foot x-ray shows no acute osseous abnormality, large plantar calcaneal spur Right foot x-ray shows chronic and degenerative changes as described above without evidence of acute osseous abnormality ABI in July shows normal  ankle brachial indices Patient was given Zosyn, vancomycin, LR 150 MLS per hour, fentanyl   Assessment & Plan: 1-bilateral foot cellulitis/diabetic ulcer/osteitis -Patient has been seen by general surgery with recommendations for no surgical intervention at this time. -After discussing case with general surgery plan will be for another 24 hours of IV antibiotics given worsening appearance in patient's feet skin/open ulcers.   -Past IV antibiotics, plan is to discharge patient after that on doxycycline and Keflex with further wound care and outpatient follow-up with general surgery.. 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 -Patient has been educated about importance of checking her feet, using appropriate shoes and keeping skin moisturized. -Continue to follow CBG trend/fluctuation.  3-Essential hypertension -Continue current antihypertensive regimen -Blood pressure  stable/well-controlled -Heart healthy diet has been encouraged.  4-history of tobacco abuse -Nicotine patch has been declined -Cessation counseling provided.  5-deconditioning/feet pain -Follow physical therapy recommendations.   DVT prophylaxis: Heparin Code Status: Full code. Family Communication: No family at bedside. Disposition:   Status is: Inpatient   Consultants:  General surgery  Procedures:  See below for  x-ray reports.  Briefly left foot MRI demonstrating left great toe osteitis/early osteomyelitis.  Antimicrobials:  Vancomycin and Zosyn.   Subjective: No fever, no chest pain, no nausea, no vomiting.  Slightly worsening appearance in the blisters/ulcers in her feet.  No frank purulent discharge.  Normal WBCs.  Patient has not been able to ambulate yet secondary to ongoing bilateral foot pain.  Objective: Vitals:   04/19/21 2100 04/20/21 0519 04/20/21 0909 04/20/21 1331  BP: (!) 157/73 138/72 114/66 (!) 115/57  Pulse: (!) 59 (!) 59 61 66  Resp: 18 18 18 16   Temp: 98 F (36.7 C) 98.7 F (37.1 C)  98.6 F (37 C)  TempSrc:      SpO2: 96% 90% 96% 93%  Weight:  76.7 kg    Height:        Intake/Output Summary (Last 24 hours) at 04/20/2021 1539 Last data filed at 04/20/2021 1300 Gross per 24 hour  Intake 1320 ml  Output --  Net 1320 ml   Filed Weights   04/18/21 0500 04/19/21 0500 04/20/21 0519  Weight: 78.4 kg 77.1 kg 76.7 kg    Examination: General exam: Alert, awake, oriented x 3; no chest pain, no fever, no nausea or vomiting.  Still having pain in her foot bilaterally. Respiratory system: Clear to auscultation. Respiratory effort normal.  No using accessory muscles.  Good saturation on room air. Cardiovascular system:RRR. No murmurs, rubs, gallops.  No JVD. Gastrointestinal system: Abdomen is nondistended, soft and nontender. No organomegaly or masses felt. Normal bowel sounds heard. Central nervous system: Alert and oriented. No focal neurological deficits. Extremities/skin: No clubbing or edema in her legs; positive swelling and medial subcutaneous cyanosis with bluish discoloration affecting her left great toe; patient also with right fourth and fifth toes mildly cyanotic with distal tip fourth toe scar.  There is no ascending cellulitis.  Mild serosanguineous discharge and an open multiple blisters appreciated on both foot. Psychiatry: Judgement and insight appear normal.  Mood & affect appropriate.   Data Reviewed: I have personally reviewed following labs and imaging studies  CBC: Recent Labs  Lab 04/16/21 2257 04/17/21 0403  WBC 12.3* 13.1*  NEUTROABS 9.4* 10.8*  HGB 14.8 13.9  HCT 44.8 42.5  MCV 95.1 94.9  PLT 250 219    Basic Metabolic Panel: Recent Labs  Lab 04/16/21 2257 04/17/21 0403  NA 139 139  K 3.6 4.0  CL 105 106  CO2 27 25  GLUCOSE 171* 122*  BUN 12 12  CREATININE 0.44 0.37*  CALCIUM 10.3 9.8  MG  --  1.9    GFR: Estimated Creatinine Clearance: 74.4 mL/min (A) (by C-G formula based on SCr of 0.37 mg/dL (L)).  Liver Function Tests: Recent Labs  Lab 04/16/21 2257 04/17/21 0403  AST 17 14*  ALT 16 15  ALKPHOS 106 90  BILITOT 0.1* 0.5  PROT 7.8 7.1  ALBUMIN 3.8 3.5    CBG: Recent Labs  Lab 04/19/21 1214 04/19/21 1703 04/19/21 2058 04/20/21 0712 04/20/21 1106  GLUCAP 118* 149* 154* 171* 135*     Recent Results (from the past 240 hour(s))  Blood Culture (routine x 2)  Status: None (Preliminary result)   Collection Time: 04/16/21 10:57 PM   Specimen: Right Antecubital; Blood  Result Value Ref Range Status   Specimen Description RIGHT ANTECUBITAL  Final   Special Requests   Final    BOTTLES DRAWN AEROBIC AND ANAEROBIC Blood Culture results may not be optimal due to an excessive volume of blood received in culture bottles   Culture   Final    NO GROWTH 3 DAYS Performed at [facility], [address] [place], [place] [address]    Report Status PENDING  Incomplete  Blood Culture (routine x 2)     Status: None (Preliminary result)   Collection Time: 04/16/21 10:57 PM   Specimen: Left Antecubital; Blood  Result Value Ref Range Status   Specimen Description LEFT ANTECUBITAL  Final   Special Requests   Final    BOTTLES DRAWN AEROBIC AND ANAEROBIC Blood Culture results may not be optimal due to an excessive volume of blood received in culture bottles   Culture   Final    NO GROWTH 3 DAYS Performed at  [facility], [address], [place], [place] [address]    Report Status PENDING  Incomplete    Radiology Studies: No results found.  Scheduled Meds:  docusate sodium  100 mg Oral BID   heparin  5,000 Units Subcutaneous Q8H   insulin aspart  0-15 Units Subcutaneous TID WC   insulin aspart  0-5 Units Subcutaneous QHS   lisinopril  40 mg Oral Daily   mupirocin cream   Topical BID   Continuous Infusions:  piperacillin-tazobactam (ZOSYN)  IV 3.375 g (04/20/21 0617)   vancomycin 750 mg (04/20/21 1101)     LOS: 3 days    Vassie Loll, MD [facility]   To contact the attending provider between 7A-7P or the covering provider during after hours 7P-7A, please log into the web site [email or web address] and access using universal [place] password for that web site. If you do not have the password, please call the hospital operator.  04/20/2021, 3:39 PM

## 2021-04-20 NOTE — Progress Notes (Signed)
°  Subjective: Still has some toe pain.  Has not ambulated yet due to foot pain bilaterally.  Objective: Vital signs in last 24 hours: Temp:  [98 F (36.7 C)-98.7 F (37.1 C)] 98.7 F (37.1 C) (12/25 0519) Pulse Rate:  [59-61] 59 (12/25 0519) Resp:  [18] 18 (12/25 0519) BP: (138-157)/(70-73) 138/72 (12/25 0519) SpO2:  [90 %-98 %] 90 % (12/25 0519) Weight:  [76.7 kg] 76.7 kg (12/25 0519) Last BM Date: 04/16/21  Intake/Output from previous day: 12/24 0701 - 12/25 0700 In: 840 [P.O.:240; IV Piggyback:600] Out: -  Intake/Output this shift: No intake/output data recorded.  General appearance: alert, cooperative, and no distress Extremities: Left great toe with some subcutaneous medial cyanosis present.  No frank purulent drainage is noted.  Right fourth and fifth toes mildly cyanotic with distal tip fourth toe eschar.  No ascending cellulitis noted.  Lab Results:  No results for input(s): WBC, HGB, HCT, PLT in the last 72 hours. BMET No results for input(s): NA, K, CL, CO2, GLUCOSE, BUN, CREATININE, CALCIUM in the last 72 hours. PT/INR No results for input(s): LABPROT, INR in the last 72 hours.  Studies/Results: No results found.  Anti-infectives: Anti-infectives (From admission, onward)    Start     Dose/Rate Route Frequency Ordered Stop   04/17/21 1000  vancomycin (VANCOREADY) IVPB 750 mg/150 mL        750 mg 150 mL/hr over 60 Minutes Intravenous Every 12 hours 04/17/21 0424     04/17/21 0600  piperacillin-tazobactam (ZOSYN) IVPB 3.375 g  Status:  Discontinued        3.375 g 100 mL/hr over 30 Minutes Intravenous Every 8 hours 04/17/21 0328 04/17/21 0340   04/17/21 0600  piperacillin-tazobactam (ZOSYN) IVPB 3.375 g        3.375 g 12.5 mL/hr over 240 Minutes Intravenous Every 8 hours 04/17/21 0342     04/16/21 2345  piperacillin-tazobactam (ZOSYN) IVPB 3.375 g        3.375 g 100 mL/hr over 30 Minutes Intravenous  Once 04/16/21 2344 04/17/21 0112   04/16/21 2345   vancomycin (VANCOCIN) IVPB 1000 mg/200 mL premix        1,000 mg 200 mL/hr over 60 Minutes Intravenous  Once 04/16/21 2344 04/17/21 0112       Assessment/Plan: Impression: Diabetic foot ulcers 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  Cellulitis slowly improving.  May end up needing amputation in future of several digits.  We will see how her toes progress.  Discussed with Dr. Gwenlyn Perking.  LOS: 3 days    Franky Macho 04/20/2021

## 2021-04-20 NOTE — Plan of Care (Signed)

## 2021-04-21 LAB — GLUCOSE, CAPILLARY
Glucose-Capillary: 137 mg/dL — ABNORMAL HIGH (ref 70–99)
Glucose-Capillary: 166 mg/dL — ABNORMAL HIGH (ref 70–99)
Glucose-Capillary: 103 mg/dL — ABNORMAL HIGH (ref 70–99)
Glucose-Capillary: 127 mg/dL — ABNORMAL HIGH (ref 70–99)

## 2021-04-21 LAB — CULTURE, BLOOD (ROUTINE X 2)
Culture: NO GROWTH
Culture: NO GROWTH

## 2021-04-21 MED ORDER — CEPHALEXIN 500 MG PO CAPS
500.0000 mg | ORAL_CAPSULE | Freq: Three times a day (TID) | ORAL | Status: DC
Start: 2021-04-21 — End: 2021-04-23
  Administered 2021-04-21 – 2021-04-23 (×8): 500 mg via ORAL
  Filled 2021-04-21 (×8): qty 1

## 2021-04-21 MED ORDER — DOXYCYCLINE HYCLATE 100 MG PO TABS
100.0000 mg | ORAL_TABLET | Freq: Two times a day (BID) | ORAL | Status: DC
  Administered 2021-04-21 – 2021-04-23 (×5): 100 mg via ORAL
  Filled 2021-04-21 (×5): qty 1

## 2021-04-21 NOTE — Progress Notes (Signed)
PROGRESS NOTE    Maria Leblanc  HYI:[id number] DOB: [birth year] DOA: 04/16/2021 PCP: Golden Pop, FNP    Chief Complaint  Patient presents with   Wound Infection    Brief Narrative:  As per H&P written byDr. Carren Rang on 04/17/21  Maria Leblanc  is a 65 y.o. female, with history of diabetes mellitus type 2, hypothyroid, MR presents to ED with a chief complaint of blisters on feet.  Patient reports that started few days ago.  She is not exactly sure when.  She did present to her PCP on Tuesday.  She reports she was given a shot of some medication that she is not sure of in the office, and then started on doxycycline.  She was told that if her pain became worse come in the ER.  Her left toe then swelled up and so she came into the ER today.  Patient reports that the blisters initially started on her right foot in her third and fourth toes.  They radiated to her right forefoot.  She could not walk because it was so painful.  She did take days off work.  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  On the same day of presentation her blisters noted to her left toe.  She reports her left toes become swollen and purplish.  It drains clear fluid.  Is been so painful that she has not been able to weight-bear on it.  She has had no fevers.  She had no purulent drainage.  Patient is not sure her glucose runs as she does not check it.  She reports compliance with her oral hypoglycemics, but she does not take insulin.  She reports her last hemoglobin A1c was 7.  Patient is a current smoker.  She smokes less than a pack a day and has no intention of quitting.  She does not  drink, use illicit drugs.  She is not vaccinated for COVID.  Patient did have ABI done in July.  She is not sure why.  She does not think she has a history of peripheral vascular disease.   In the ED Patient is afebrile, heart rate is normal, respiratory rate is normal, blood pressure 174/80, satting  93% Patient leukocytosis 12.3, hemoglobin stable Chemistry panel was unremarkable Blood cultures pending Left foot x-ray shows no acute osseous abnormality, large plantar calcaneal spur Right foot x-ray shows chronic and degenerative changes as described above without evidence of acute osseous abnormality ABI in July shows normal  ankle brachial indices Patient was given Zosyn, vancomycin, LR 150 MLS per hour, fentanyl   Assessment & Plan: 1-bilateral foot cellulitis/diabetic ulcer/osteitis -Patient has been seen by general surgery with recommendations for no surgical intervention at this time. -Overall continue to improve; no nausea or vomiting.  Will attempt transition to oral antibiotics and follow response.  -Close outpatient follow-up with general surgery will be arranged at time of discharge.. 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 -Patient has been educated about importance of checking her feet, using appropriate shoes and keeping skin moisturized. -Continue to follow CBG trend/fluctuation.  3-Essential hypertension -Continue current antihypertensive regimen -Blood pressure stable/well-controlled -Heart healthy diet has been encouraged.  4-history of tobacco abuse -Nicotine patch has been declined -Cessation  counseling provided.  5-deconditioning/feet pain -Follow physical therapy recommendations.   DVT prophylaxis: Heparin Code Status: Full code. Family Communication: No family at bedside. Disposition:   Status is: Inpatient   Consultants:  General surgery  Procedures:  See below for x-ray reports.  Briefly left foot MRI demonstrating left great toe osteitis/early  osteomyelitis.  Antimicrobials:  Vancomycin and Zosyn.   Subjective: No fever, no chest pain, no nausea or vomiting.  Still having pain in her feet, erythema and cyanosis seen several days ago seem to have mostly resolved; normal WBCs appreciated on her labs.  Objective: Vitals:   04/20/21 0909 04/20/21 1331 04/20/21 2128 04/21/21 0509  BP: 114/66 (!) 115/57 127/85 134/89  Pulse: 61 66 62 69  Resp: 18 16 19 19   Temp:  98.6 F (37 C) 98.2 F (36.8 C) 98.4 F (36.9 C)  TempSrc:   Oral Oral  SpO2: 96% 93% 94% 95%  Weight:    77 kg  Height:    5\' 6"  (1.676 m)    Intake/Output Summary (Last 24 hours) at 04/21/2021 1331 Last data filed at 04/21/2021 0900 Gross per 24 hour  Intake 600 ml  Output --  Net 600 ml   Filed Weights   04/19/21 0500 04/20/21 0519 04/21/21 0509  Weight: 77.1 kg 76.7 kg 77 kg    Examination: General exam: Alert, awake, oriented x 3, reports still ongoing pain in her feet bilaterally.  No fever Respiratory system: Clear to auscultation. Respiratory effort normal.  No using accessory muscle.  Good saturation on room air. Cardiovascular system:RRR. No murmurs, rubs, gallops.  No JVD. Gastrointestinal system: Abdomen is nondistended, soft and nontender. No organomegaly or masses felt. Normal bowel sounds heard. Central nervous system: Alert and oriented. No focal neurological deficits. Extremities/skin: No clubbing.  Having some decrease in the size of patient's feet blisters bilaterally and also there is no appreciated drainage out of open wounds.  She continues to have pain, but expressed to feel better. Psychiatry: Judgement and insight appear normal. Mood & affect appropriate.    Data Reviewed: I have personally reviewed following labs and imaging studies  CBC: Recent Labs  Lab 04/16/21 2257 04/17/21 0403  WBC 12.3* 13.1*  NEUTROABS 9.4* 10.8*  HGB 14.8 13.9  HCT 44.8 42.5  MCV 95.1 94.9  PLT 250 219    Basic Metabolic Panel: Recent Labs   Lab 04/16/21 2257 04/17/21 0403  NA 139 139  K 3.6 4.0  CL 105 106  CO2 27 25  GLUCOSE 171* 122*  BUN 12 12  CREATININE 0.44 0.37*  CALCIUM 10.3 9.8  MG  --  1.9    GFR: Estimated Creatinine Clearance: 74.5 mL/min (A) (by C-G formula based on SCr of 0.37 mg/dL (L)).  Liver Function Tests: Recent Labs  Lab 04/16/21 2257 04/17/21 0403  AST 17 14*  ALT 16 15  ALKPHOS 106 90  BILITOT 0.1* 0.5  PROT 7.8 7.1  ALBUMIN 3.8 3.5    CBG: Recent Labs  Lab 04/20/21 1106 04/20/21 1613 04/20/21 2129 04/21/21 0709 04/21/21 1116  GLUCAP 135* 118* 159* 137* 166*     Recent Results (from the past 240 hour(s))  Blood Culture (routine x 2)     Status: None   Collection Time: 04/16/21 10:57 PM   Specimen: Right Antecubital; Blood  Result Value Ref Range Status   Specimen Description RIGHT ANTECUBITAL  Final   Special Requests   Final    BOTTLES DRAWN AEROBIC AND ANAEROBIC Blood Culture results  may not be optimal due to an excessive volume of blood received in culture bottles   Culture   Final    NO GROWTH 5 DAYS Performed at [facility], [address] [place], [place] [address]    Report Status 04/21/2021 FINAL  Final  Blood Culture (routine x 2)     Status: None   Collection Time: 04/16/21 10:57 PM   Specimen: Left Antecubital; Blood  Result Value Ref Range Status   Specimen Description LEFT ANTECUBITAL  Final   Special Requests   Final    BOTTLES DRAWN AEROBIC AND ANAEROBIC Blood Culture results may not be optimal due to an excessive volume of blood received in culture bottles   Culture   Final    NO GROWTH 5 DAYS Performed at [facility], [address], [place], [place] [address]    Report Status 04/21/2021 FINAL  Final    Radiology Studies: No results found.  Scheduled Meds:  cephALEXin  500 mg Oral Q8H   docusate sodium  100 mg Oral BID   doxycycline  100 mg Oral Q12H   heparin  5,000 Units Subcutaneous Q8H   insulin aspart  0-15 Units Subcutaneous TID  WC   insulin aspart  0-5 Units Subcutaneous QHS   lisinopril  40 mg Oral Daily   mupirocin cream   Topical BID   Continuous Infusions:     LOS: 4 days    Vassie Loll, MD [facility]   To contact the attending provider between 7A-7P or the covering provider during after hours 7P-7A, please log into the web site [email or web address] and access using universal [place] password for that web site. If you do not have the password, please call the hospital operator.  04/21/2021, 1:31 PM

## 2021-04-21 NOTE — Evaluation (Signed)
Physical Therapy Evaluation Patient Details Name: Maria Leblanc MRN: [id number] DOB: [birth year] Today's Date: 04/21/2021  History of Present Illness  Maria Leblanc  is a 65 y.o. female, with history of diabetes mellitus type 2, hypothyroid, MR presents to ED with a chief complaint of blisters on feet.  Patient reports that started few days ago.  She is not exactly sure when.  She did present to her PCP on Tuesday.  She reports she was given a shot of some medication that she is not sure of in the office, and then started on doxycycline.  She was told that if her pain became worse come in the ER.  Her left toe then swelled up and so she came into the ER today.  Patient reports that the blisters initially started on her right foot in her third and fourth toes.  They radiated to her right forefoot.  She could not walk because it was so painful.  She did take days off work.  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  On the same day of presentation her blisters noted to her left toe.  She reports her left toes become swollen and purplish.  It drains clear fluid.  Is been so painful that she has not been able to weight-bear on it.  She has had no fevers.  She had no purulent drainage.  Patient is not sure her glucose runs as she does not check it.  She reports compliance with her oral hypoglycemics, but she does not take insulin.  She reports her last hemoglobin A1c was 7.  Patient is a current smoker.  She smokes less than a pack a day and has no intention of quitting.  She does not  drink, use illicit drugs.  She is not vaccinated for COVID.  Patient did have ABI done in July.  She is not sure why.  She does not think she has a history of peripheral vascular disease.   Clinical Impression   Patient presents with bilateral toe wounds/blisters/sores causing difficulty in walking due to location.  Pt able to perform all mobility in an independent and safe fashion and maintains  self-imposed heel-touch weight-bearing to limit toe contact on ground and does so in a safe and as efficient manner as possible.  Patient able to demonstrate functional independence and does not require PT services at this time and she reports she is able to manage walking easier without use of AD at this time.         Recommendations for follow up therapy are one component of a multi-disciplinary discharge planning process, led by the attending physician.  Recommendations may be updated based on patient status, additional functional criteria and insurance authorization.  Follow Up Recommendations No PT follow up    Assistance Recommended at Discharge Set up Supervision/Assistance  Functional Status Assessment Patient has not had a recent decline in their functional status  Equipment Recommendations  None recommended by PT (pt feels the RW makes her heel touch ambulation more difficult)    Recommendations for Other Services       Precautions / Restrictions        Mobility  Bed Mobility Overal bed mobility: Independent               Patient Response: Cooperative  Transfers Overall transfer level: Independent  Ambulation/Gait Ambulation/Gait assistance: Independent Gait Distance (Feet): 50 Feet Assistive device: None         General Gait Details: pt required to ambulate heel-touch due to bilateral toe wounds/blisters  Stairs Stairs: Yes Stairs assistance: Modified independent (Device/Increase time)   Number of Stairs: 2    Wheelchair Mobility    Modified Rankin (Stroke Patients Only)       Balance Overall balance assessment: Independent                                           Pertinent Vitals/Pain Pain Assessment: 0-10 Pain Score: 5  Pain Location: bilateral toes Pain Descriptors / Indicators: Burning;Sore Pain Intervention(s): Limited activity within patient's tolerance    Home Living  Family/patient expects to be discharged to:: Private residence Living Arrangements: Spouse/significant other Available Help at Discharge: Family Type of Home: House Home Access: Stairs to enter   Secretary/administrator of Steps: 2-3   Home Layout: One level        Prior Function Prior Level of Function : Independent/Modified Independent                     Hand Dominance        Extremity/Trunk Assessment   Upper Extremity Assessment Upper Extremity Assessment: Overall WFL for tasks assessed    Lower Extremity Assessment Lower Extremity Assessment: Overall WFL for tasks assessed    Cervical / Trunk Assessment Cervical / Trunk Assessment: Normal  Communication   Communication: No difficulties  Cognition Arousal/Alertness: Awake/alert Behavior During Therapy: WFL for tasks assessed/performed Overall Cognitive Status: Within Functional Limits for tasks assessed                                          General Comments      Exercises     Assessment/Plan    PT Assessment Patient does not need any further PT services  PT Problem List         PT Treatment Interventions      PT Goals (Current goals can be found in the Care Plan section)  Acute Rehab PT Goals Patient Stated Goal: "Get my feet well" PT Goal Formulation: With patient Time For Goal Achievement: 04/21/21 Potential to Achieve Goals: Good    Frequency     Barriers to discharge        Co-evaluation               AM-PAC PT "6 Clicks" Mobility  Outcome Measure Help needed turning from your back to your side while in a flat bed without using bedrails?: None Help needed moving from lying on your back to sitting on the side of a flat bed without using bedrails?: None Help needed moving to and from a bed to a chair (including a wheelchair)?: None Help needed standing up from a chair using your arms (e.g., wheelchair or bedside chair)?: None Help needed to walk in  hospital room?: None Help needed climbing 3-5 steps with a railing? : None 6 Click Score: 24    End of Session   Activity Tolerance: Patient tolerated treatment well Patient left: in bed Nurse Communication: Mobility status PT Visit Diagnosis: Unsteadiness on feet (R26.81);Difficulty in walking, not elsewhere classified (R26.2)    Time: 4128-7867 PT Time  Calculation (min) (ACUTE ONLY): 20 min   Charges:   PT Evaluation $PT Eval Low Complexity: 1 Low         3:59 PM, 04/21/21 M. Shary Decamp, PT, DPT Physical Therapist- [place] Office Number: [phone]

## 2021-04-21 NOTE — Progress Notes (Addendum)
Overnight, patient's toes and feet were emerged in soapy water, cleaned and dried at the scheduled times. Ointment was applied to affected toes. Patient states she has feeling in her toes, with pain on the bottom of the right foot. The left great toe has a large open sore on the outer aspect, but she says it does not hurt.  Also,  she refused the bedtime scheduled heparin injection; RN educated patient. She accepted the 0600 heparin injection.  PRN Miralax and scheduled colace administered for constipation. No bowel movement this shift.

## 2021-04-21 NOTE — Progress Notes (Signed)
°  Subjective: No acute changes noted.  Is about to the undergo physical therapy to assess her ambulatory status.  Objective: Vital signs in last 24 hours: Temp:  [98.2 F (36.8 C)-98.6 F (37 C)] 98.4 F (36.9 C) (12/26 0509) Pulse Rate:  [62-69] 69 (12/26 0509) Resp:  [16-19] 19 (12/26 0509) BP: (115-134)/(57-89) 134/89 (12/26 0509) SpO2:  [93 %-95 %] 95 % (12/26 0509) Weight:  [77 kg] 77 kg (12/26 0509) Last BM Date: 04/16/21  Intake/Output from previous day: 12/25 0701 - 12/26 0700 In: 720 [P.O.:720] Out: -  Intake/Output this shift: Total I/O In: 360 [P.O.:360] Out: -   General appearance: alert, cooperative, and no distress Extremities: No significant change in either feet or toes since yesterday.  The erythema and cyanosis that was seen several days ago seems to have mostly resolved with resultant small eschars.  Lab Results:  No results for input(s): WBC, HGB, HCT, PLT in the last 72 hours. BMET No results for input(s): NA, K, CL, CO2, GLUCOSE, BUN, CREATININE, CALCIUM in the last 72 hours. PT/INR No results for input(s): LABPROT, INR in the last 72 hours.  Studies/Results: No results found.  Anti-infectives: Anti-infectives (From admission, onward)    Start     Dose/Rate Route Frequency Ordered Stop   04/21/21 1000  doxycycline (VIBRA-TABS) tablet 100 mg        100 mg Oral Every 12 hours 04/21/21 0737     04/21/21 0830  cephALEXin (KEFLEX) capsule 500 mg        500 mg Oral Every 8 hours 04/21/21 0737     04/17/21 1000  vancomycin (VANCOREADY) IVPB 750 mg/150 mL  Status:  Discontinued        750 mg 150 mL/hr over 60 Minutes Intravenous Every 12 hours 04/17/21 0424 04/21/21 0737   04/17/21 0600  piperacillin-tazobactam (ZOSYN) IVPB 3.375 g  Status:  Discontinued        3.375 g 100 mL/hr over 30 Minutes Intravenous Every 8 hours 04/17/21 0328 04/17/21 0340   04/17/21 0600  piperacillin-tazobactam (ZOSYN) IVPB 3.375 g  Status:  Discontinued        3.375  g 12.5 mL/hr over 240 Minutes Intravenous Every 8 hours 04/17/21 0342 04/21/21 0737   04/16/21 2345  piperacillin-tazobactam (ZOSYN) IVPB 3.375 g        3.375 g 100 mL/hr over 30 Minutes Intravenous  Once 04/16/21 2344 04/17/21 0112   04/16/21 2345  vancomycin (VANCOCIN) IVPB 1000 mg/200 mL premix        1,000 mg 200 mL/hr over 60 Minutes Intravenous  Once 04/16/21 2344 04/17/21 0112       Assessment/Plan: Impression: Cellulitis of both feet and toes, improving.  No need for acute surgical invention at this time.  Agree with oral antibiotics upon discharge.  We will see the assessment from physical therapy today.  LOS: 4 days    Maria Leblanc 04/21/2021

## 2021-04-22 LAB — BASIC METABOLIC PANEL
Anion gap: 6 (ref 5–15)
BUN: 20 mg/dL (ref 8–23)
CO2: 27 mmol/L (ref 22–32)
Calcium: 9.7 mg/dL (ref 8.9–10.3)
Chloride: 103 mmol/L (ref 98–111)
Creatinine, Ser: 0.46 mg/dL (ref 0.44–1.00)
GFR, Estimated: >60 mL/min (ref >60)
Glucose, Bld: 166 mg/dL — ABNORMAL HIGH (ref 70–99)
Potassium: 4.2 mmol/L (ref 3.5–5.1)
Sodium: 136 mmol/L (ref 135–145)

## 2021-04-22 LAB — GLUCOSE, CAPILLARY
Glucose-Capillary: 143 mg/dL — ABNORMAL HIGH (ref 70–99)
Glucose-Capillary: 163 mg/dL — ABNORMAL HIGH (ref 70–99)
Glucose-Capillary: 175 mg/dL — ABNORMAL HIGH (ref 70–99)
Glucose-Capillary: 152 mg/dL — ABNORMAL HIGH (ref 70–99)

## 2021-04-22 LAB — CBC
HCT: 44.6 % (ref 36.0–46.0)
Hemoglobin: 14.4 g/dL (ref 12.0–15.0)
MCH: 30.4 pg (ref 26.0–34.0)
MCHC: 32.3 g/dL (ref 30.0–36.0)
MCV: 94.1 fL (ref 80.0–100.0)
Platelets: 221 10*3/uL (ref 150–400)
RBC: 4.74 MIL/uL (ref 3.87–5.11)
RDW: 13.2 % (ref 11.5–15.5)
WBC: 12.6 10*3/uL — ABNORMAL HIGH (ref 4.0–10.5)
nRBC: 0 % (ref 0.0–0.2)

## 2021-04-22 MED ORDER — MUPIROCIN 2 % EX OINT
TOPICAL_OINTMENT | CUTANEOUS | Status: AC
  Filled 2021-04-22: qty 22

## 2021-04-22 NOTE — Progress Notes (Signed)
PROGRESS NOTE    Maria Leblanc  DJM:[id number] DOB: 06/24/1955 DOA: 04/16/2021 PCP: Golden Pop, FNP    Chief Complaint  Patient presents with   Wound Infection    Brief Narrative:  As per H&P written byDr. Carren Rang on 04/17/21  Maria Leblanc  is a 65 y.o. female, with history of diabetes mellitus type 2, hypothyroid, MR presents to ED with a chief complaint of blisters on feet.  Patient reports that started few days ago.  She is not exactly sure when.  She did present to her PCP on Tuesday.  She reports she was given a shot of some medication that she is not sure of in the office, and then started on doxycycline.  She was told that if her pain became worse come in the ER.  Her left toe then swelled up and so she came into the ER today.  Patient reports that the blisters initially started on her right foot in her third and fourth toes.  They radiated to her right forefoot.  She could not walk because it was so painful.  She did take days off work.  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  On the same day of presentation her blisters noted to her left toe.  She reports her left toes become swollen and purplish.  It drains clear fluid.  Is been so painful that she has not been able to weight-bear on it.  She has had no fevers.  She had no purulent drainage.  Patient is not sure her glucose runs as she does not check it.  She reports compliance with her oral hypoglycemics, but she does not take insulin.  She reports her last hemoglobin A1c was 7.  Patient is a current smoker.  She smokes less than a pack a day and has no intention of quitting.  She does not  drink, use illicit drugs.  She is not vaccinated for COVID.  Patient did have ABI done in July.  She is not sure why.  She does not think she has a history of peripheral vascular disease.   In the ED Patient is afebrile, heart rate is normal, respiratory rate is normal, blood pressure 174/80, satting  93% Patient leukocytosis 12.3, hemoglobin stable Chemistry panel was unremarkable Blood cultures pending Left foot x-ray shows no acute osseous abnormality, large plantar calcaneal spur Right foot x-ray shows chronic and degenerative changes as described above without evidence of acute osseous abnormality ABI in July shows normal  ankle brachial indices Patient was given Zosyn, vancomycin, LR 150 MLS per hour, fentanyl   Assessment & Plan: 1-bilateral foot cellulitis/diabetic ulcer/osteitis -Patient has been seen by general surgery with recommendations for no surgical intervention at this time. -Overall continue to improve; no nausea or vomiting.   -Has remained afebrile; tolerating oral antibiotics. -Close outpatient follow-up with general surgery will be arranged at time of discharge.. -Continue as needed analgesics. -Following recommendation by physical therapy patient will be able to go home without any home health services or DME's. -Patient will have a significant other living with her to further assist her and to facilitate transition of care on 04/23/2021.  2-type 2 diabetes mellitus with diabetic foot ulcer -A1c 7.1 -On inpatient continue holding oral hypoglycemic agents -Continue sliding scale insulin -Modified carbohydrate diet. -Patient has been educated about importance of checking her feet, using appropriate shoes and keeping skin moisturized. -Continue to follow CBG trend/fluctuation.  3-Essential hypertension -  Continue current antihypertensive regimen -Blood pressure stable/well-controlled -Heart healthy diet has been encouraged.  4-history of tobacco abuse -Nicotine patch has been declined -Cessation counseling provided.  5-deconditioning/feet pain -Follow physical therapy recommendations.   DVT prophylaxis: Heparin Code Status: Full code. Family Communication: No family at bedside. Disposition:   Status is: Inpatient.  Anticipate discharge on 04/23/2021  in order to provide adequate social support and assistance at home.   Consultants:  General surgery  Procedures:  See below for x-ray reports.  Briefly left foot MRI demonstrating left great toe osteitis/early osteomyelitis.  Antimicrobials:  Vancomycin and Zosyn.   Subjective: No fever, no chest pain, no nausea, no vomiting.  So far has tolerated it well oral antibiotics and reports ongoing pain in her feet (especially while bearing weight).  Objective: Vitals:   04/21/21 0509 04/21/21 1335 04/21/21 2112 04/22/21 0436  BP: 134/89 118/65 (!) 143/68 (!) 153/95  Pulse: 69 69 (!) 56 69  Resp: 19 16 19 19   Temp: 98.4 F (36.9 C) 98 F (36.7 C) 98.1 F (36.7 C) 98.7 F (37.1 C)  TempSrc: Oral Oral Oral   SpO2: 95% 96% 95% 94%  Weight: 77 kg   77.5 kg  Height: 5\' 6"  (1.676 m)   5\' 6"  (1.676 m)    Intake/Output Summary (Last 24 hours) at 04/22/2021 1128 Last data filed at 04/22/2021 0900 Gross per 24 hour  Intake 840 ml  Output --  Net 840 ml   Filed Weights   04/20/21 0519 04/21/21 0509 04/22/21 0436  Weight: 76.7 kg 77 kg 77.5 kg    Examination: General exam: Alert, awake, oriented x 3; in no major distress; still complaining of pain in her feet.  No nausea, no vomiting, no shortness of breath. Respiratory system: Clear to auscultation. Respiratory effort normal.  Good saturation on room air. Cardiovascular system:RRR. No murmurs, rubs, gallops.  No JVD. Gastrointestinal system: Abdomen is nondistended, soft and nontender. No organomegaly or masses felt. Normal bowel sounds heard. Central nervous system: Alert and oriented. No focal neurological deficits. Extremities/skin: Partial cyanosis appreciated on her left great toe and right third fourth and fifth toes.  No active drainage seen.  Multiple other blisters in both soles deflating and caudally not open.  Reports pain while bearing weight.  No petechiae appreciated.  No clubbing. Psychiatry: Judgement and insight  appear normal. Mood & affect appropriate.   Data Reviewed: I have personally reviewed following labs and imaging studies  CBC: Recent Labs  Lab 04/16/21 2257 04/17/21 0403 04/22/21 0517  WBC 12.3* 13.1* 12.6*  NEUTROABS 9.4* 10.8*  --   HGB 14.8 13.9 14.4  HCT 44.8 42.5 44.6  MCV 95.1 94.9 94.1  PLT 250 219 221    Basic Metabolic Panel: Recent Labs  Lab 04/16/21 2257 04/17/21 0403 04/22/21 0517  NA 139 139 136  K 3.6 4.0 4.2  CL 105 106 103  CO2 27 25 27   GLUCOSE 171* 122* 166*  BUN 12 12 20   CREATININE 0.44 0.37* 0.46  CALCIUM 10.3 9.8 9.7  MG  --  1.9  --     GFR: Estimated Creatinine Clearance: 74.7 mL/min (by C-G formula based on SCr of 0.46 mg/dL).  Liver Function Tests: Recent Labs  Lab 04/16/21 2257 04/17/21 0403  AST 17 14*  ALT 16 15  ALKPHOS 106 90  BILITOT 0.1* 0.5  PROT 7.8 7.1  ALBUMIN 3.8 3.5    CBG: Recent Labs  Lab 04/21/21 1116 04/21/21 1600 04/21/21 2113 04/22/21 0734  04/22/21 1123  GLUCAP 166* 103* 127* 143* 163*     Recent Results (from the past 240 hour(s))  Blood Culture (routine x 2)     Status: None   Collection Time: 04/16/21 10:57 PM   Specimen: Right Antecubital; Blood  Result Value Ref Range Status   Specimen Description RIGHT ANTECUBITAL  Final   Special Requests   Final    BOTTLES DRAWN AEROBIC AND ANAEROBIC Blood Culture results may not be optimal due to an excessive volume of blood received in culture bottles   Culture   Final    NO GROWTH 5 DAYS Performed at [facility], [address], [place], [place] [address]    Report Status 04/21/2021 FINAL  Final  Blood Culture (routine x 2)     Status: None   Collection Time: 04/16/21 10:57 PM   Specimen: Left Antecubital; Blood  Result Value Ref Range Status   Specimen Description LEFT ANTECUBITAL  Final   Special Requests   Final    BOTTLES DRAWN AEROBIC AND ANAEROBIC Blood Culture results may not be optimal due to an excessive volume of blood received in  culture bottles   Culture   Final    NO GROWTH 5 DAYS Performed at [facility], [address] [place], [place] [address]    Report Status 04/21/2021 FINAL  Final    Radiology Studies: No results found.  Scheduled Meds:  cephALEXin  500 mg Oral Q8H   docusate sodium  100 mg Oral BID   doxycycline  100 mg Oral Q12H   heparin  5,000 Units Subcutaneous Q8H   insulin aspart  0-15 Units Subcutaneous TID WC   insulin aspart  0-5 Units Subcutaneous QHS   lisinopril  40 mg Oral Daily   mupirocin cream   Topical BID   Continuous Infusions:    LOS: 5 days    Vassie Loll, MD [facility]   To contact the attending provider between 7A-7P or the covering provider during after hours 7P-7A, please log into the web site [email or web address] and access using universal [place] password for that web site. If you do not have the password, please call the hospital operator.  04/22/2021, 11:28 AM

## 2021-04-22 NOTE — Progress Notes (Signed)
°  Subjective: Still with some toe pain.  Objective: Vital signs in last 24 hours: Temp:  [98 F (36.7 C)-98.7 F (37.1 C)] 98.7 F (37.1 C) (12/27 0436) Pulse Rate:  [56-69] 69 (12/27 0436) Resp:  [16-19] 19 (12/27 0436) BP: (118-153)/(65-95) 153/95 (12/27 0436) SpO2:  [94 %-96 %] 94 % (12/27 0436) Weight:  [77.5 kg] 77.5 kg (12/27 0436) Last BM Date: 04/16/21  Intake/Output from previous day: 12/26 0701 - 12/27 0700 In: 840 [P.O.:840] Out: -  Intake/Output this shift: Total I/O In: 360 [P.O.:360] Out: -   General appearance: alert, cooperative, and no distress Extremities: Stable toes with some partial cyanosis of the left great toe and the right third, fourth, and fifth toes.  No need for acute toe amputations at the present time.  Lab Results:  Recent Labs    04/22/21 0517  WBC 12.6*  HGB 14.4  HCT 44.6  PLT 221   BMET Recent Labs    04/22/21 0517  NA 136  K 4.2  CL 103  CO2 27  GLUCOSE 166*  BUN 20  CREATININE 0.46  CALCIUM 9.7   PT/INR No results for input(s): LABPROT, INR in the last 72 hours.  Studies/Results: No results found.  Anti-infectives: Anti-infectives (From admission, onward)    Start     Dose/Rate Route Frequency Ordered Stop   04/21/21 1000  doxycycline (VIBRA-TABS) tablet 100 mg        100 mg Oral Every 12 hours 04/21/21 0737     04/21/21 0830  cephALEXin (KEFLEX) capsule 500 mg        500 mg Oral Every 8 hours 04/21/21 0737     04/17/21 1000  vancomycin (VANCOREADY) IVPB 750 mg/150 mL  Status:  Discontinued        750 mg 150 mL/hr over 60 Minutes Intravenous Every 12 hours 04/17/21 0424 04/21/21 0737   04/17/21 0600  piperacillin-tazobactam (ZOSYN) IVPB 3.375 g  Status:  Discontinued        3.375 g 100 mL/hr over 30 Minutes Intravenous Every 8 hours 04/17/21 0328 04/17/21 0340   04/17/21 0600  piperacillin-tazobactam (ZOSYN) IVPB 3.375 g  Status:  Discontinued        3.375 g 12.5 mL/hr over 240 Minutes Intravenous Every 8  hours 04/17/21 0342 04/21/21 0737   04/16/21 2345  piperacillin-tazobactam (ZOSYN) IVPB 3.375 g        3.375 g 100 mL/hr over 30 Minutes Intravenous  Once 04/16/21 2344 04/17/21 0112   04/16/21 2345  vancomycin (VANCOCIN) IVPB 1000 mg/200 mL premix        1,000 mg 200 mL/hr over 60 Minutes Intravenous  Once 04/16/21 2344 04/17/21 0112       Assessment/Plan: Impression: Diabetic foot cellulitis of both feet.  Patient does have evidence of diabetic toes in both feet.  I told her that ultimately she may need toe amputations.  She would like to see how the toes heal, which is fine with me.  Continue antibiotics as ordered.  Physical therapy is involved and states she is able to ambulate on her own.  LOS: 5 days    Maria Leblanc 04/22/2021

## 2021-04-23 ENCOUNTER — Inpatient Hospital Stay: Payer: Self-pay

## 2021-04-23 DIAGNOSIS — E11628 Type 2 diabetes mellitus with other skin complications: Secondary | ICD-10-CM

## 2021-04-23 DIAGNOSIS — L089 Local infection of the skin and subcutaneous tissue, unspecified: Secondary | ICD-10-CM

## 2021-04-23 LAB — GLUCOSE, CAPILLARY
Glucose-Capillary: 140 mg/dL — ABNORMAL HIGH (ref 70–99)
Glucose-Capillary: 221 mg/dL — ABNORMAL HIGH (ref 70–99)
Glucose-Capillary: 117 mg/dL — ABNORMAL HIGH (ref 70–99)
Glucose-Capillary: 201 mg/dL — ABNORMAL HIGH (ref 70–99)

## 2021-04-23 MED ORDER — SODIUM CHLORIDE 0.9% FLUSH
10.0000 mL | INTRAVENOUS | Status: DC | PRN
  Administered 2021-04-27: 10 mL

## 2021-04-23 MED ORDER — VANCOMYCIN HCL 1500 MG/300ML IV SOLN
1500.0000 mg | Freq: Once | INTRAVENOUS | Status: AC
  Administered 2021-04-23: 21:00:00 1500 mg via INTRAVENOUS
  Filled 2021-04-23: qty 300

## 2021-04-23 MED ORDER — GABAPENTIN 300 MG PO CAPS
300.0000 mg | ORAL_CAPSULE | Freq: Three times a day (TID) | ORAL | Status: DC
  Administered 2021-04-23 – 2021-04-28 (×16): 300 mg via ORAL
  Filled 2021-04-23 (×16): qty 1

## 2021-04-23 MED ORDER — SODIUM CHLORIDE 0.9% FLUSH
10.0000 mL | Freq: Two times a day (BID) | INTRAVENOUS | Status: DC
  Administered 2021-04-23 – 2021-04-29 (×9): 10 mL

## 2021-04-23 MED ORDER — VANCOMYCIN HCL IN DEXTROSE 1-5 GM/200ML-% IV SOLN
1000.0000 mg | Freq: Two times a day (BID) | INTRAVENOUS | Status: DC
  Administered 2021-04-24 – 2021-04-29 (×11): 1000 mg via INTRAVENOUS
  Filled 2021-04-23 (×11): qty 200

## 2021-04-23 MED ORDER — CHLORHEXIDINE GLUCONATE CLOTH 2 % EX PADS
6.0000 | MEDICATED_PAD | Freq: Every day | CUTANEOUS | Status: DC
  Administered 2021-04-23 – 2021-04-29 (×6): 6 via TOPICAL

## 2021-04-23 MED ORDER — SODIUM CHLORIDE 0.9 % IV SOLN
2.0000 g | INTRAVENOUS | Status: DC
  Administered 2021-04-23 – 2021-04-29 (×7): 2 g via INTRAVENOUS
  Filled 2021-04-23 (×7): qty 20

## 2021-04-23 NOTE — Progress Notes (Signed)
Pharmacy Antibiotic Note  Maria Leblanc is a 65 y.o. female admitted on 04/16/2021 with  osteomyelitis .  Pharmacy has been consulted for Vancomycin dosing. MRI shows cellulitis with great toe erosion on left and   possible early acute osteo.  Plan: Vancomycin 1500mg  IV loading dose then 1000 mg IV Q 12 hrs. Goal AUC 400-550. Expected AUC: 514 SCr used: 0.8 (0.46 actual)  Also on ceftriaxone 2gm IV q24h F/U cxs and clinical progress Monitor V/S, labs and levels as indicated  Height: 5\' 6"  (167.6 cm) Weight: 77.8 kg (171 lb 8.3 oz) IBW/kg (Calculated) : 59.3  Temp (24hrs), Avg:98.4 F (36.9 C), Min:98 F (36.7 C), Max:98.7 F (37.1 C)  Recent Labs  Lab 04/16/21 2257 04/17/21 0403 04/22/21 0517  WBC 12.3* 13.1* 12.6*  CREATININE 0.44 0.37* 0.46  LATICACIDVEN 1.6  --   --     Estimated Creatinine Clearance: 74.8 mL/min (by C-G formula based on SCr of 0.46 mg/dL).    Allergies  Allergen Reactions   Propofol Other (See Comments)    Patient reports "it causes me to fight them when i'm waking up"    Antimicrobials this admission: Vancomycin  12/22 >> 12/26 restarted 12/28>> ceftriaxone 12/28 >>  Zosyn 12/21>>12/26 Cephalexin 12/26>12/28 Doxycycline 12/26> 12/28  Microbiology results: 12/21 BCx: ngtd  Thank you for allowing pharmacy to be a part of this patients care.  1/29, BS 1/22, Elder Cyphers Clinical Pharmacist Pager [phone] 04/23/2021 3:09 PM

## 2021-04-23 NOTE — Progress Notes (Signed)
Awaiting demarcation of the toes to determine whether patient will need toe amputation in the future.  Would continue oral antibiotics as an outpatient for approximately 1 more week.  I will see her in my office in 2 weeks for follow-up.

## 2021-04-23 NOTE — Progress Notes (Signed)
Peripherally Inserted Central Catheter Placement  The IV Nurse has discussed with the patient and/or persons authorized to consent for the patient, the purpose of this procedure and the potential benefits and risks involved with this procedure.  The benefits include less needle sticks, lab draws from the catheter, and the patient may be discharged home with the catheter. Risks include, but not limited to, infection, bleeding, blood clot (thrombus formation), and puncture of an artery; nerve damage and irregular heartbeat and possibility to perform a PICC exchange if needed/ordered by physician.  Alternatives to this procedure were also discussed.  Bard Power PICC patient education guide, fact sheet on infection prevention and patient information card has been provided to patient /or left at bedside.    PICC Placement Documentation  PICC Single Lumen 04/23/21 Right Basilic 38 cm 0 cm (Active)  Indication for Insertion or Continuance of Line Home intravenous therapies (PICC only);Prolonged intravenous therapies 04/23/21 1804  Exposed Catheter (cm) 0 cm 04/23/21 1804  Site Assessment Clean;Dry;Intact 04/23/21 1804  Line Status Flushed;Saline locked;Blood return noted 04/23/21 1804  Dressing Type Transparent;Securing device 04/23/21 1804  Dressing Status Clean;Dry;Intact 04/23/21 1804  Antimicrobial disc in place? Yes 04/23/21 1804  Safety Lock Not Applicable 04/23/21 1804  Line Care Connections checked and tightened 04/23/21 1804  Dressing Intervention New dressing 04/23/21 1804  Dressing Change Due 04/30/21 04/23/21 1804       Maria Leblanc 04/23/2021, 6:05 PM

## 2021-04-23 NOTE — Progress Notes (Signed)
Patient refused 0500 wound care. Stated, "she wanted to wait and get it done at the next available scheduled time."

## 2021-04-23 NOTE — Progress Notes (Signed)
PROGRESS NOTE  Maria Leblanc GBT:[id number] DOB: Jun 05, 1955 DOA: 04/16/2021 PCP: Golden Pop, FNP  Brief History:  As per H&P written byDr. Carren Rang on 04/17/21  Maria Leblanc  is a 65 y.o. female, with history of diabetes mellitus type 2, hypothyroid, MR presents to ED with a chief complaint of blisters on feet.  Patient reports that started few days ago.  She is not exactly sure when.  She did present to her PCP on Tuesday.  She reports she was given a shot of some medication that she is not sure of in the office, and then started on doxycycline.  She was told that if her pain became worse come in the ER.  Her left toe then swelled up and so she came into the ER today.  Patient reports that the blisters initially started on her right foot in her third and fourth toes.  They radiated to her right forefoot.  She could not walk because it was so painful.  She did take days off work.  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  On the same day of presentation her blisters noted to her left toe.  She reports her left toes become swollen and purplish.  It drains clear fluid.  Is been so painful that she has not been able to weight-bear on it.  She has had no fevers.  She had no purulent drainage.  Patient is not sure her glucose runs as she does not check it.  She reports compliance with her oral hypoglycemics, but she does not take insulin.  She reports her last hemoglobin A1c was 7.  Patient is a current smoker.  She smokes less than a pack a day and has no intention of quitting.  She does not  drink, use illicit drugs.  She is not vaccinated for COVID.  Patient did have ABI done in July.  She is not sure why.  She does not think she has a history of peripheral vascular disease.   In the ED Patient is afebrile, heart rate is normal, respiratory rate is normal, blood pressure 174/80, satting 93% Patient leukocytosis 12.3, hemoglobin stable Chemistry panel  was unremarkable Blood cultures pending Left foot x-ray shows no acute osseous abnormality, large plantar calcaneal spur Right foot x-ray shows chronic and degenerative changes as described above without evidence of acute osseous abnormality ABI in July shows normal  ankle brachial indices Patient was given Zosyn, vancomycin, LR 150 MLS per hour, fentanyl    Assessment/Plan: 1-bilateral foot cellulitis/diabetic foot infection/osteitis -Patient has been seen by general surgery with recommendations for no surgical intervention at this time.   -Has remained afebrile; tolerating oral antibiotics. -Close outpatient follow-up with general surgery will be arranged at time of discharge.. -Continue as needed analgesics. -MRI left foot--faint marrow edema distal phalanx of L-great toe = osteitis/early osteomyelitis -Patient will have a significant other living with her to further assist her and to facilitate transition of care on 04/23/2021.   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 -Patient has been educated about importance of checking her feet, using appropriate shoes and keeping skin moisturized. -Continue to follow CBG trend/fluctuation.   Essential hypertension -Continue current antihypertensive regimen -Blood pressure stable/well-controlled -Heart healthy diet has been encouraged.   history of tobacco abuse -Nicotine patch  has been declined -Cessation counseling provided.   deconditioning/feet pain -start gabapentin         Family Communication:   no Family at bedside  Consultants:  general surgery  Code Status:  FULL / DNR  DVT Prophylaxis:  [place] Heparin    Procedures: As Listed in Progress Note Above  Antibiotics: Cephalexin 12/26>>12/28 Doxy 12/26>>12/28 Vanc 12/28>> Ceftriaxone 12/28>>      Subjective: She had an  episode of n/v today.  Denies f/c, cp, hematemesis, abd pain, diarrhea.  Complains of bilateral foot pain  Objective: Vitals:   04/22/21 2102 04/23/21 0507 04/23/21 0900 04/23/21 1317  BP: 131/61 (!) 150/82  (!) 144/67  Pulse: 61 63  63  Resp: 16 16  16   Temp: 98.6 F (37 C) 98 F (36.7 C)  98.7 F (37.1 C)  TempSrc: Oral Oral  Oral  SpO2: 93% 95%  94%  Weight:  77.4 kg 77.8 kg   Height:        Intake/Output Summary (Last 24 hours) at 04/23/2021 1500 Last data filed at 04/23/2021 1323 Gross per 24 hour  Intake 480 ml  Output --  Net 480 ml   Weight change: -0.091 kg Exam:  General:  Pt is alert, follows commands appropriately, not in acute distress HEENT: No icterus, No thrush, No neck mass, [place]/AT Cardiovascular: RRR, S1/S2, no rubs, no gallops Respiratory: CTA bilaterally, no wheezing, no crackles, no rhonchi Abdomen: Soft/+BS, non tender, non distended, no guarding Extremities: see pics of left foot below      Data Reviewed: I have personally reviewed following labs and imaging studies Basic Metabolic Panel: Recent Labs  Lab 04/16/21 2257 04/17/21 0403 04/22/21 0517  NA 139 139 136  K 3.6 4.0 4.2  CL 105 106 103  CO2 27 25 27   GLUCOSE 171* 122* 166*  BUN 12 12 20   CREATININE 0.44 0.37* 0.46  CALCIUM 10.3 9.8 9.7  MG  --  1.9  --    Liver Function Tests: Recent Labs  Lab 04/16/21 2257 04/17/21 0403  AST 17 14*  ALT 16 15  ALKPHOS 106 90  BILITOT 0.1* 0.5  PROT 7.8 7.1  ALBUMIN 3.8 3.5   No results for input(s): LIPASE, AMYLASE in the last 168 hours. No results for input(s): AMMONIA in the last 168 hours. Coagulation Profile: Recent Labs  Lab 04/16/21 2257  INR 1.1   CBC: Recent Labs  Lab 04/16/21 2257 04/17/21 0403 04/22/21 0517  WBC 12.3* 13.1* 12.6*  NEUTROABS 9.4* 10.8*  --   HGB 14.8 13.9 14.4  HCT 44.8 42.5 44.6  MCV 95.1 94.9 94.1  PLT 250 219 221   Cardiac Enzymes: No results for input(s): CKTOTAL, CKMB, CKMBINDEX,  TROPONINI in the last 168 hours. BNP: Invalid input(s): POCBNP CBG: Recent Labs  Lab 04/22/21 1123 04/22/21 1628 04/22/21 2104 04/23/21 0707 04/23/21 1101  GLUCAP 163* 175* 152* 140* 221*   HbA1C: No results for input(s): HGBA1C in the last 72 hours. Urine analysis:    Component Value Date/Time   COLORURINE YELLOW 02/11/2019 0332   APPEARANCEUR CLEAR 02/11/2019 0332   LABSPEC 1.035 (H) 02/11/2019 0332   PHURINE 5.0 02/11/2019 0332   GLUCOSEU >=500 (A) 02/11/2019 0332   HGBUR SMALL (A) 02/11/2019 0332   BILIRUBINUR NEGATIVE 02/11/2019 0332   KETONESUR NEGATIVE 02/11/2019 0332   PROTEINUR NEGATIVE 02/11/2019 0332   NITRITE NEGATIVE 02/11/2019 0332   LEUKOCYTESUR MODERATE (A) 02/11/2019 0332   Sepsis Labs: @LABRCNTIP (procalcitonin:4,lacticidven:4) ) Recent Results (from the past  240 hour(s))  Blood Culture (routine x 2)     Status: None   Collection Time: 04/16/21 10:57 PM   Specimen: Right Antecubital; Blood  Result Value Ref Range Status   Specimen Description RIGHT ANTECUBITAL  Final   Special Requests   Final    BOTTLES DRAWN AEROBIC AND ANAEROBIC Blood Culture results may not be optimal due to an excessive volume of blood received in culture bottles   Culture   Final    NO GROWTH 5 DAYS Performed at [facility], [address] [place], [place] [address]    Report Status 04/21/2021 FINAL  Final  Blood Culture (routine x 2)     Status: None   Collection Time: 04/16/21 10:57 PM   Specimen: Left Antecubital; Blood  Result Value Ref Range Status   Specimen Description LEFT ANTECUBITAL  Final   Special Requests   Final    BOTTLES DRAWN AEROBIC AND ANAEROBIC Blood Culture results may not be optimal due to an excessive volume of blood received in culture bottles   Culture   Final    NO GROWTH 5 DAYS Performed at [facility], [address], [place], [place] [address]    Report Status 04/21/2021 FINAL  Final     Scheduled Meds:  cephALEXin  500 mg Oral Q8H    docusate sodium  100 mg Oral BID   doxycycline  100 mg Oral Q12H   heparin  5,000 Units Subcutaneous Q8H   insulin aspart  0-15 Units Subcutaneous TID WC   insulin aspart  0-5 Units Subcutaneous QHS   lisinopril  40 mg Oral Daily   mupirocin cream   Topical BID   Continuous Infusions:  Procedures/Studies: MR FOOT LEFT W WO CONTRAST  Result Date: 04/17/2021 CLINICAL DATA:  Foot swelling, nondiabetic, osteomyelitis suspected EXAM: MRI OF THE LEFT FOREFOOT WITHOUT AND WITH CONTRAST TECHNIQUE: Multiplanar, multisequence MR imaging of the left forefoot was performed both before and after administration of intravenous contrast. CONTRAST:  64mL GADAVIST GADOBUTROL 1 MMOL/ML IV SOLN COMPARISON:  X-ray 04/16/2021 FINDINGS: Technical Note: Despite efforts by the technologist and patient, motion artifact is present on today's exam and could not be eliminated. There is also poor fat saturation affecting the toes near the edge of the field of view. This reduces exam sensitivity and specificity. Bones/Joint/Cartilage No acute fracture. No malalignment. No focal erosion or site of bone destruction. Faint marrow edema within the distal phalanx of the left great toe. Preservation of the fatty T1 marrow signal. Mild degenerative changes of the forefoot, most notably involving the tarsometatarsal joints and first metatarsal-phalangeal joint. No effusion. Ligaments Intact Lisfranc ligament. Collateral ligaments of the forefoot are intact. Muscles and Tendons Fatty atrophy of the abductor digiti minimi muscle. Otherwise preserved muscle bulk. Multifocal areas of nodular thickening of the plantar fascia likely representing fibromatosis. Intact flexor and extensor tendons. No tenosynovitis. Soft tissues Focal soft tissue swelling with edema and enhancement along the medial aspect of the great toe with prominent overlying cutaneous blister. No organized fluid collection within the soft tissues. There are multiple additional  smaller cutaneous blisters predominantly along the plantar surface of the forefoot. IMPRESSION: 1. Cellulitis of the left great toe with prominent overlying cutaneous blister. Faint marrow edema within the distal phalanx of the left great toe without focal erosion, which may represent reactive osteitis versus early acute osteomyelitis. 2. Multiple additional smaller cutaneous blisters predominantly along the plantar surface of the forefoot. 3. Multifocal areas of nodular thickening of the plantar  fascia suggest plantar fibromatosis. 4. 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 Electronically Signed   By: Duanne Guess D.O.   On: 04/17/2021 08:53   DG Chest Port 1 View  Result Date: 04/16/2021 CLINICAL DATA:  Questionable sepsis. EXAM: PORTABLE CHEST 1 VIEW COMPARISON:  Chest x-ray 11/23/2019 FINDINGS: The heart size and mediastinal contours are within normal limits. Both lungs are clear. The visualized skeletal structures are unremarkable. IMPRESSION: No active disease. Electronically Signed   By: Darliss Cheney M.D.   On: 04/16/2021 22:53   DG Foot Complete Left  Result Date: 04/16/2021 CLINICAL DATA:  Bilateral foot infection. EXAM: LEFT FOOT - COMPLETE 3+ VIEW COMPARISON:  None. FINDINGS: There is no evidence of acute fracture or dislocation. A radiopaque fixation plate and screws are seen along the visualized portion of the left lateral malleolus. There is a large plantar calcaneal spur. Moderate severity degenerative changes are noted along the dorsal aspect of the proximal to mid left foot. Soft tissues are unremarkable. IMPRESSION: 1. Degenerative changes without evidence of an acute osseous abnormality. 2. Prior ORIF of the left lateral malleolus. 3. Large plantar calcaneal spur. Electronically Signed   By: Aram Candela M.D.   On: 04/16/2021 23:24   DG Foot Complete Right  Result Date: 04/16/2021 CLINICAL DATA:  Bilateral foot infection. EXAM: RIGHT  FOOT COMPLETE - 3+ VIEW COMPARISON:  None. FINDINGS: There is no evidence of acute fracture or dislocation. A chronic appearing deformity is seen involving the distal aspect of the proximal phalanx of the fourth right toe. There is a moderate-sized plantar calcaneal spur. Moderate severity degenerative changes are seen along the dorsal aspect of the mid right foot. Mild soft tissue swelling is seen along the dorsal aspect of the mid to distal right foot. IMPRESSION: Chronic and degenerative changes, as described above, without evidence of acute osseous abnormality. Electronically Signed   By: Aram Candela M.D.   On: 04/16/2021 23:23   [place] EKG SITE RITE  Result Date: 04/23/2021 If Site Rite image not attached, placement could not be confirmed due to current cardiac rhythm.   Catarina Hartshorn, DO  [facility]  If 7PM-7AM, please contact night-coverage [email or web address] Password [email or web address] 04/23/2021, 3:00 PM   LOS: 6 days

## 2021-04-23 NOTE — Plan of Care (Signed)

## 2021-04-24 DIAGNOSIS — M86172 Other acute osteomyelitis, left ankle and foot: Secondary | ICD-10-CM

## 2021-04-24 DIAGNOSIS — M86179 Other acute osteomyelitis, unspecified ankle and foot: Secondary | ICD-10-CM

## 2021-04-24 LAB — BASIC METABOLIC PANEL
Anion gap: 7 (ref 5–15)
BUN: 22 mg/dL (ref 8–23)
CO2: 28 mmol/L (ref 22–32)
Calcium: 10 mg/dL (ref 8.9–10.3)
Chloride: 100 mmol/L (ref 98–111)
Creatinine, Ser: 0.56 mg/dL (ref 0.44–1.00)
GFR, Estimated: >60 mL/min (ref >60)
Glucose, Bld: 123 mg/dL — ABNORMAL HIGH (ref 70–99)
Potassium: 4.7 mmol/L (ref 3.5–5.1)
Sodium: 135 mmol/L (ref 135–145)

## 2021-04-24 LAB — GLUCOSE, CAPILLARY
Glucose-Capillary: 157 mg/dL — ABNORMAL HIGH (ref 70–99)
Glucose-Capillary: 178 mg/dL — ABNORMAL HIGH (ref 70–99)
Glucose-Capillary: 274 mg/dL — ABNORMAL HIGH (ref 70–99)
Glucose-Capillary: 211 mg/dL — ABNORMAL HIGH (ref 70–99)

## 2021-04-24 LAB — C-REACTIVE PROTEIN: CRP: 0.7 mg/dL (ref <1.0)

## 2021-04-24 LAB — CBC
HCT: 43.5 % (ref 36.0–46.0)
Hemoglobin: 14.3 g/dL (ref 12.0–15.0)
MCH: 31.6 pg (ref 26.0–34.0)
MCHC: 32.9 g/dL (ref 30.0–36.0)
MCV: 96 fL (ref 80.0–100.0)
Platelets: 224 10*3/uL (ref 150–400)
RBC: 4.53 MIL/uL (ref 3.87–5.11)
RDW: 13.6 % (ref 11.5–15.5)
WBC: 14.4 10*3/uL — ABNORMAL HIGH (ref 4.0–10.5)
nRBC: 0 % (ref 0.0–0.2)

## 2021-04-24 LAB — SEDIMENTATION RATE: Sed Rate: 36 mm/hr — ABNORMAL HIGH (ref 0–22)

## 2021-04-24 NOTE — Progress Notes (Signed)
PROGRESS NOTE  [name]:[id number] DOB: February 15, 1956 DOA: 04/16/2021 PCP: Golden Pop, FNP  Brief History:  As per H&P written byDr. Carren Rang on 04/17/21  Maria Leblanc  is a 65 y.o. female, with history of diabetes mellitus type 2, hypothyroid, MR presents to ED with a chief complaint of blisters on feet.  Patient reports that started few days ago.  She is not exactly sure when.  She did present to her PCP on Tuesday.  She reports she was given a shot of some medication that she is not sure of in the office, and then started on doxycycline.  She was told that if her pain became worse come in the ER.  Her left toe then swelled up and so she came into the ER today.  Patient reports that the blisters initially started on her right foot in her third and fourth toes.  They radiated to her right forefoot.  She could not walk because it was so painful.  She did take days off work.  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  On the same day of presentation her blisters noted to her left toe.  She reports her left toes become swollen and purplish.  It drains clear fluid.  Is been so painful that she has not been able to weight-bear on it.  She has had no fevers.  She had no purulent drainage.  Patient is not sure her glucose runs as she does not check it.  She reports compliance with her oral hypoglycemics, but she does not take insulin.  She reports her last hemoglobin A1c was 7.  Patient is a current smoker.  She smokes less than a pack a day and has no intention of quitting.  She does not  drink, use illicit drugs.  She is not vaccinated for COVID.  Patient did have ABI done in July.  She is not sure why.  She does not think she has a history of peripheral vascular disease.   In the ED Patient is afebrile, heart rate is normal, respiratory rate is normal, blood pressure 174/80, satting 93% Patient leukocytosis 12.3, hemoglobin stable Chemistry panel  was unremarkable Blood cultures pending Left foot x-ray shows no acute osseous abnormality, large plantar calcaneal spur Right foot x-ray shows chronic and degenerative changes as described above without evidence of acute osseous abnormality ABI in July shows normal  ankle brachial indices Patient was given Zosyn, vancomycin, LR 150 MLS per hour, fentanyl MRI suggested early osteomyelitis of left great toe.  PICC line was placed for home IV antibiotics     Assessment/Plan: 1-bilateral foot cellulitis/diabetic foot infection/osteomyelitis -Patient has been seen by general surgery with recommendations for no surgical intervention at this time.   -Close outpatient follow-up with general surgery will be arranged at time of discharge.. -Continue as needed analgesics. -MRI left foot--faint marrow edema distal phalanx of L-great toe = osteitis/early osteomyelitis -Patient will have a significant other living with her to further assist her and to facilitate transition of care on 04/25/2021. 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 -Patient has been educated about importance of checking her feet, using appropriate shoes and keeping skin moisturized. -Continue to follow CBG  trend/fluctuation.   Essential hypertension -Continue current antihypertensive regimen -Blood pressure stable/well-controlled -Heart healthy diet has been encouraged.   history of tobacco abuse -Nicotine patch has been declined -Cessation counseling provided.   deconditioning/feet pain -continue gabapentin               Family Communication:   no Family at bedside   Consultants:  general surgery   Code Status:  FULL    DVT Prophylaxis:  [place] Heparin      Procedures: As Listed  in Progress Note Above   Antibiotics: Cephalexin 12/26>>12/28 Doxy 12/26>>12/28 Vanc 12/28>> Ceftriaxone 12/28>>      Subjective:  Patient denies fevers, chills, headache, chest pain, dyspnea, nausea, vomiting, diarrhea, abdominal pain, dysuria, hematuria, hematochezia, and melena.  Objective: Vitals:   04/23/21 2132 04/24/21 0500 04/24/21 0549 04/24/21 1301  BP: 128/79  136/75 127/70  Pulse: 65  63 66  Resp: 17  18 18   Temp: 98 F (36.7 C)  99.2 F (37.3 C)   TempSrc: Oral  Oral   SpO2: 93%  92% (!) 89%  Weight:  77.4 kg    Height:        Intake/Output Summary (Last 24 hours) at 04/24/2021 1716 Last data filed at 04/24/2021 1700 Gross per 24 hour  Intake 680 ml  Output --  Net 680 ml   Weight change: 0.416 kg Exam:  General:  Pt is alert, follows commands appropriately, not in acute distress HEENT: No icterus, No thrush, No neck mass, [place]/AT Cardiovascular: RRR, S1/S2, no rubs, no gallops Respiratory: CTA bilaterally, no wheezing, no crackles, no rhonchi Abdomen: Soft/+BS, non tender, non distended, no guarding Extremities: No edema, No lymphangitis, No petechiae, No rashes, no synovitis       Data Reviewed: I have personally reviewed following labs and imaging studies Basic Metabolic Panel: Recent Labs  Lab 04/22/21 0517 04/24/21 0543  NA 136 135  K 4.2 4.7  CL 103 100  CO2 27 28  GLUCOSE 166* 123*  BUN 20 22  CREATININE 0.46 0.56  CALCIUM 9.7 10.0   Liver Function Tests: No results for input(s): AST, ALT, ALKPHOS, BILITOT, PROT, ALBUMIN in the last 168 hours. No results for input(s): LIPASE, AMYLASE in the last 168 hours. No results for input(s): AMMONIA in the last 168 hours. Coagulation Profile: No results for input(s): INR, PROTIME in the last 168 hours. CBC: Recent Labs  Lab 04/22/21 0517 04/24/21 0543  WBC 12.6* 14.4*  HGB 14.4 14.3  HCT 44.6 43.5  MCV 94.1 96.0  PLT 221 224   Cardiac Enzymes: No results for input(s):  CKTOTAL, CKMB, CKMBINDEX, TROPONINI in the last 168 hours. BNP: Invalid input(s): POCBNP CBG: Recent Labs  Lab 04/23/21 1556 04/23/21 2133 04/24/21 0747 04/24/21 1139 04/24/21 1620  GLUCAP 117* 201* 157* 178* 274*   HbA1C: No results for input(s): HGBA1C in the last 72 hours. Urine analysis:    Component Value Date/Time   COLORURINE YELLOW 02/11/2019 0332   APPEARANCEUR CLEAR 02/11/2019 0332   LABSPEC 1.035 (H) 02/11/2019 0332   PHURINE 5.0 02/11/2019 0332   GLUCOSEU >=500 (A) 02/11/2019 0332   HGBUR SMALL (A) 02/11/2019 0332   BILIRUBINUR NEGATIVE 02/11/2019 0332   KETONESUR NEGATIVE 02/11/2019 0332   PROTEINUR NEGATIVE 02/11/2019 0332   NITRITE NEGATIVE 02/11/2019 0332   LEUKOCYTESUR MODERATE (A) 02/11/2019 0332   Sepsis Labs: @LABRCNTIP (procalcitonin:4,lacticidven:4) ) Recent Results (from the past 240 hour(s))  Blood Culture (routine x 2)     Status: None   Collection  Time: 04/16/21 10:57 PM   Specimen: Right Antecubital; Blood  Result Value Ref Range Status   Specimen Description RIGHT ANTECUBITAL  Final   Special Requests   Final    BOTTLES DRAWN AEROBIC AND ANAEROBIC Blood Culture results may not be optimal due to an excessive volume of blood received in culture bottles   Culture   Final    NO GROWTH 5 DAYS Performed at [facility], [address], [place] [employer] [place] [employer], [place] [address]    Report Status 04/21/2021 FINAL  Final  Blood Culture (routine x 2)     Status: None   Collection Time: 04/16/21 10:57 PM   Specimen: Left Antecubital; Blood  Result Value Ref Range Status   Specimen Description LEFT ANTECUBITAL  Final   Special Requests   Final    BOTTLES DRAWN AEROBIC AND ANAEROBIC Blood Culture results may not be optimal due to an excessive volume of blood received in culture bottles   Culture   Final    NO GROWTH 5 DAYS Performed at [facility], [address], [place], [place] [address]    Report Status 04/21/2021 FINAL  Final     Scheduled Meds:   Chlorhexidine Gluconate Cloth  6 each Topical Daily   docusate sodium  100 mg Oral BID   gabapentin  300 mg Oral TID   heparin  5,000 Units Subcutaneous Q8H   insulin aspart  0-15 Units Subcutaneous TID WC   insulin aspart  0-5 Units Subcutaneous QHS   lisinopril  40 mg Oral Daily   mupirocin cream   Topical BID   sodium chloride flush  10-40 mL Intracatheter Q12H   Continuous Infusions:  cefTRIAXone (ROCEPHIN)  IV Stopped (04/24/21 1700)   vancomycin 1,000 mg (04/24/21 1712)    Procedures/Studies: MR FOOT LEFT W WO CONTRAST  Result Date: 04/17/2021 CLINICAL DATA:  Foot swelling, nondiabetic, osteomyelitis suspected EXAM: MRI OF THE LEFT FOREFOOT WITHOUT AND WITH CONTRAST TECHNIQUE: Multiplanar, multisequence MR imaging of the left forefoot was performed both before and after administration of intravenous contrast. CONTRAST:  58mL GADAVIST GADOBUTROL 1 MMOL/ML IV SOLN COMPARISON:  X-ray 04/16/2021 FINDINGS: Technical Note: Despite efforts by the technologist and patient, motion artifact is present on today's exam and could not be eliminated. There is also poor fat saturation affecting the toes near the edge of the field of view. This reduces exam sensitivity and specificity. Bones/Joint/Cartilage No acute fracture. No malalignment. No focal erosion or site of bone destruction. Faint marrow edema within the distal phalanx of the left great toe. Preservation of the fatty T1 marrow signal. Mild degenerative changes of the forefoot, most notably involving the tarsometatarsal joints and first metatarsal-phalangeal joint. No effusion. Ligaments Intact Lisfranc ligament. Collateral ligaments of the forefoot are intact. Muscles and Tendons Fatty atrophy of the abductor digiti minimi muscle. Otherwise preserved muscle bulk. Multifocal areas of nodular thickening of the plantar fascia likely representing fibromatosis. Intact flexor and extensor tendons. No tenosynovitis. Soft tissues Focal soft tissue  swelling with edema and enhancement along the medial aspect of the great toe with prominent overlying cutaneous blister. No organized fluid collection within the soft tissues. There are multiple additional smaller cutaneous blisters predominantly along the plantar surface of the forefoot. IMPRESSION: 1. Cellulitis of the left great toe with prominent overlying cutaneous blister. Faint marrow edema within the distal phalanx of the left great toe without focal erosion, which may represent reactive osteitis versus early acute osteomyelitis. 2. Multiple additional smaller cutaneous blisters predominantly along the plantar surface  of the forefoot. 3. 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 4. 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 Electronically Signed   By: Davina Poke D.O.   On: 04/17/2021 08:53   DG Chest Port 1 View  Result Date: 04/16/2021 CLINICAL DATA:  Questionable sepsis. EXAM: PORTABLE CHEST 1 VIEW COMPARISON:  Chest x-ray 11/23/2019 FINDINGS: The heart size and mediastinal contours are within normal limits. Both lungs are clear. The visualized skeletal structures are unremarkable. IMPRESSION: No active disease. Electronically Signed   By: Ronney Asters M.D.   On: 04/16/2021 22:53   DG Foot Complete Left  Result Date: 04/16/2021 CLINICAL DATA:  Bilateral foot infection. EXAM: LEFT FOOT - COMPLETE 3+ VIEW COMPARISON:  None. FINDINGS: There is no evidence of acute fracture or dislocation. A radiopaque fixation plate and screws are seen along the visualized portion of the left lateral malleolus. There is a large plantar calcaneal spur. Moderate severity degenerative changes are noted along the dorsal aspect of the proximal to mid left foot. Soft tissues are unremarkable. IMPRESSION: 1. Degenerative changes without evidence of an acute osseous abnormality. 2. Prior ORIF of the left lateral malleolus. 3. Large  plantar calcaneal spur. Electronically Signed   By: Virgina Norfolk M.D.   On: 04/16/2021 23:24   DG Foot Complete Right  Result Date: 04/16/2021 CLINICAL DATA:  Bilateral foot infection. EXAM: RIGHT FOOT COMPLETE - 3+ VIEW COMPARISON:  None. FINDINGS: There is no evidence of acute fracture or dislocation. A chronic appearing deformity is seen involving the distal aspect of the proximal phalanx of the fourth right toe. There is a moderate-sized plantar calcaneal spur. Moderate severity degenerative changes are seen along the dorsal aspect of the mid right foot. Mild soft tissue swelling is seen along the dorsal aspect of the mid to distal right foot. IMPRESSION: Chronic and degenerative changes, as described above, without evidence of acute osseous abnormality. Electronically Signed   By: Virgina Norfolk M.D.   On: 04/16/2021 23:23   [place] EKG SITE RITE  Result Date: 04/23/2021 If Site Rite image not attached, placement could not be confirmed due to current cardiac rhythm.   Orson Eva, DO  [facility]  If 7PM-7AM, please contact night-coverage [email or web address] Password [email or web address] 04/24/2021, 5:16 PM   LOS: 7 days

## 2021-04-24 NOTE — Progress Notes (Signed)
PICC line placement noted.  Will see patient in my office in follow up on 05/08/21.

## 2021-04-24 NOTE — TOC Initial Note (Signed)
Transition of Care [facility]) - Initial/Assessment Note    Patient Details  Name: Maria Leblanc MRN: [id number] Date of Birth: 21-Jul-1955  Transition of Care [facility]) CM/SW Contact:    Leitha Bleak, RN Phone Number: 04/24/2021, 1:53 PM  Clinical Narrative:    Patient will need 28 days of IV antibiotics. Jeri Modena accepted the referral. Patient has no insurance, no home health agencies accepting. Patient works full-time and HH does not do private pay. Patient will need to do outpatient services for labs. That is being set up. Pam will educate with morning dose tomorrow and patient will be ready to DC home. RN/MD updated.              Expected Discharge Plan: Home/Self Care Barriers to Discharge: Other (must enter comment), No Home Care Agency will accept this patient (setting up outpatient services for labs/ home infusion/ no insurance)  Patient Goals and CMS Choice Patient states their goals for this hospitalization and ongoing recovery are:: to go home. CMS Medicare.gov Compare Post Acute Care list provided to:: Patient Choice offered to / list presented to : Patient  Expected Discharge Plan and Services Expected Discharge Plan: Home/Self Care      [facility] Agency: [facility] Date HH Agency Contacted: 04/24/21 Time HH Agency Contacted: [id number] Representative spoke with at [facility] Agency: Jeri Modena  Prior Living Arrangements/Services     Activities of Daily Living Home Assistive Devices/Equipment: None ADL Screening (condition at time of admission) Patient's cognitive ability adequate to safely complete daily activities?: Yes Is the patient deaf or have difficulty hearing?: No Does the patient have difficulty seeing, even when wearing glasses/contacts?: No Does the patient have difficulty concentrating, remembering, or making decisions?: No Patient able to express need for assistance with ADLs?: Yes Does the patient have difficulty dressing or bathing?: No Independently performs ADLs?:  Yes (appropriate for developmental age) Does the patient have difficulty walking or climbing stairs?: No Weakness of Legs: None Weakness of Arms/Hands: None  Permission Sought/Granted   Emotional Assessment     Affect (typically observed): Accepting Orientation: : Oriented to Self, Oriented to Place, Oriented to  Time, Oriented to Situation Alcohol / Substance Use: Not Applicable Psych Involvement: No (comment)  Admission diagnosis:  Diabetic foot ulcer (HCC) [J19.147, L97.509] Foot infection [L08.9] Cellulitis of lower extremity, unspecified laterality [L03.119] Patient Active Problem List   Diagnosis Date Noted   Diabetic foot infection (HCC) 04/23/2021   Diabetic foot ulcer (HCC) 04/17/2021   Foot ulcer due to secondary DM (HCC)    Cellulitis of foot 10/28/2020   Essential hypertension 10/28/2020   Cellulitis 10/28/2020   LLQ pain 07/19/2019   N&V (nausea and vomiting) 07/19/2019   Diarrhea 07/19/2019   Perirectal abscess    Calculus of gallbladder with acute cholecystitis without obstruction    Sepsis (HCC) 03/27/2017   RUQ pain 03/27/2017   Hyponatremia 03/27/2017   Thrombocytopenia (HCC) 03/27/2017   Diabetes mellitus type II, non insulin dependent (HCC) 03/27/2017   Adrenal nodule (HCC) 03/27/2017   PCP:  Golden Pop, FNP Pharmacy:   [facility] [address], [place] - [address] [place] [address] Phone: [phone] Fax: [phone]   Readmission Risk Interventions No flowsheet data found.

## 2021-04-24 NOTE — Progress Notes (Signed)
PHARMACY CONSULT NOTE FOR:  OUTPATIENT  PARENTERAL ANTIBIOTIC THERAPY (OPAT)  Indication: Osteomyelitis Regimen: Ceftriaxone 2gm IV q24h and Vancomycin 1gm IV q12 End date: 05/22/2021  IV antibiotic discharge orders are pended. To discharging provider:  please sign these orders via discharge navigator,  Select New Orders & click on the button choice - Manage This Unsigned Work.     Thank you for allowing pharmacy to be a part of this patient's care.  Elder Cyphers, BS Loura Back, BCPS Clinical Pharmacist Pager [phone] 04/24/2021, 10:24 AM

## 2021-04-25 LAB — GLUCOSE, CAPILLARY
Glucose-Capillary: 190 mg/dL — ABNORMAL HIGH (ref 70–99)
Glucose-Capillary: 208 mg/dL — ABNORMAL HIGH (ref 70–99)
Glucose-Capillary: 197 mg/dL — ABNORMAL HIGH (ref 70–99)
Glucose-Capillary: 229 mg/dL — ABNORMAL HIGH (ref 70–99)

## 2021-04-25 LAB — VANCOMYCIN, TROUGH: Vancomycin Tr: 14 ug/mL — ABNORMAL LOW (ref 15–20)

## 2021-04-25 MED ORDER — CEFTRIAXONE IV (FOR PTA / DISCHARGE USE ONLY): 2.0000 g | INTRAVENOUS | 0 refills | Status: DC

## 2021-04-25 MED ORDER — GABAPENTIN 300 MG PO CAPS: 300.0000 mg | ORAL_CAPSULE | Freq: Three times a day (TID) | ORAL | 2 refills | Status: DC

## 2021-04-25 MED ORDER — VANCOMYCIN IV (FOR PTA / DISCHARGE USE ONLY): 1000.0000 mg | Freq: Two times a day (BID) | INTRAVENOUS | 0 refills | Status: DC

## 2021-04-25 MED ORDER — BISACODYL 10 MG RE SUPP
10.0000 mg | Freq: Once | RECTAL | Status: AC
  Administered 2021-04-25: 13:00:00 10 mg via RECTAL
  Filled 2021-04-25: qty 1

## 2021-04-25 NOTE — Discharge Summary (Signed)
Physician Discharge Summary  ARNA LUIS GYF:[id number] DOB: 06/27/1955 DOA: 04/16/2021  PCP: Ralph Leyden, FNP  Admit date: 04/16/2021 Discharge date: 04/25/2021  Admitted From: Home Disposition:  Home   Recommendations for Outpatient Follow-up:  Follow up with PCP in 1-2 weeks Please obtain BMP/CBC in one week Continue IV vanco and ceftriaxone until 05/22/21 Discontinue PICC line after last dose antibiotics  Home Health:[facility] Equipment/Devices:PICC line  Discharge Condition: Stable CODE STATUS: FULL Diet recommendation: Heart Healthy / Carb Modified    Brief/Interim Summary: 65 y.o. female, with history of diabetes mellitus type 2, hypothyroid, MR presents to ED with a chief complaint of blisters on feet.  Patient reports that started few days ago.  She is not exactly sure when.  She did present to her PCP on Tuesday.  She reports she was given a shot of some medication that she is not sure of in the office, and then started on doxycycline.  She was told that if her pain became worse come in the ER.  Her left toe then swelled up and so she came into the ER.  Patient reports that the blisters initially started on her right foot in her third and fourth toes.  They radiated to her right forefoot.  She could not walk because it was so painful.   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  On the same day of presentation her blisters noted to her left toe.  She reports her left toes become swollen and purplish.  It drains clear fluid.  Is been so painful that she has not been able to weight-bear on it.  She has had no fevers.  She had no purulent drainage.  Patient is not sure her glucose runs as she does not check it.  She reports compliance with her oral hypoglycemics, but she does not take insulin.  She reports her last hemoglobin A1c was 7.  Patient is a current smoker.  She smokes less than a pack a day and has no intention of quitting.  She does not   drink, use illicit drugs.  She is not vaccinated for COVID.  Patient did have ABI done in July.  She is not sure why.  She does not think she has a history of peripheral vascular disease.   In the ED Patient is afebrile, heart rate is normal, respiratory rate is normal, blood pressure 174/80, satting 93% Patient leukocytosis 12.3, hemoglobin stable Chemistry panel was unremarkable Blood cultures pending Left foot x-ray shows no acute osseous abnormality, large plantar calcaneal spur Right foot x-ray shows chronic and degenerative changes as described above without evidence of acute osseous abnormality ABI in July shows normal  ankle brachial indices Patient was given Zosyn, vancomycin, LR 150 MLS per hour, fentanyl MRI suggested early osteomyelitis of left great toe.  PICC line was placed for home IV antibiotics  Discharge Diagnoses:  -bilateral foot cellulitis/diabetic foot infection/osteomyelitis -Patient has been seen by general surgery with recommendations for no surgical intervention at this time.   -Close outpatient follow-up with general surgery will be arranged at time of discharge.. -Continue as needed analgesics. -MRI left foot--faint marrow edema distal phalanx of L-great toe = osteitis/early osteomyelitis -Patient will have a significant other living with her to further assist her and to facilitate transition of care on 04/25/2021. 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 -Patient has been educated about importance of checking her feet, using appropriate shoes and keeping skin moisturized. -Continue to follow CBG trend/fluctuation.   Essential hypertension -Continue current antihypertensive regimen -Blood pressure stable/well-controlled -Heart  healthy diet has been encouraged.   history of tobacco abuse -Nicotine patch has been declined -Cessation counseling provided.   deconditioning/feet pain -continue gabapentin     Discharge Instructions  Discharge Instructions     [facility] pharmacist to adjust dose for Vancomycin, Aminoglycosides and other anti-infective therapies as requested by physician.   Complete by: As directed    [facility] to provide Cath Flo 72m   Complete by: As directed    Administer for PICC line occlusion and as ordered by physician for other access device issues.   Anaphylaxis Kit: Provided to treat any anaphylactic reaction to the medication being provided to the patient if First Dose or when requested by physician   Complete by: As directed    Epinephrine 149mml vial / amp: Administer 0.90m94m0.90ml78mubcutaneously once for moderate to severe anaphylaxis, nurse to call physician and pharmacy when reaction occurs and call 911 if needed for immediate care   Diphenhydramine 50mg54mIV vial: Administer 25-50mg 26mM PRN for first dose reaction, rash, itching, mild reaction, nurse to call physician and pharmacy when reaction occurs   Sodium Chloride 0.9% NS 500ml I91mdminister if needed for hypovolemic blood pressure drop or as ordered by physician after call to physician with anaphylactic reaction   Change dressing on IV access line weekly and PRN   Complete by: As directed    Flush IV access with Sodium Chloride 0.9% and Heparin 10 units/ml or 100 units/ml   Complete by: As directed    Home infusion instructions - [facility]   Complete by: As directed    Instructions: Flush IV access with Sodium Chloride 0.9% and Heparin 10units/ml or 100units/ml   Change dressing on IV access line: Weekly and PRN   Instructions Cath Flo 2mg: Ad14mister for PICC Line occlusion and as ordered by physician for other access device   [facility] pharmacist to adjust dose for:  Vancomycin, Aminoglycosides and other anti-infective therapies as requested by physician   Method of administration may be changed at the discretion of home infusion pharmacist based upon assessment of the patient and/or caregivers ability to self-administer the medication ordered   Complete by: As directed       Allergies as of 04/25/2021       Reactions   Propofol Other (See Comments)   Patient reports "it causes me to fight them when i'm waking up"        Medication List     STOP taking these medications    dicyclomine 10 MG capsule Commonly known as: BENTYL   doxycycline 100 MG tablet Commonly known as: VIBRA-TABS   silver sulfADIAZINE 1 % cream Commonly known as: SILVADENE       TAKE these medications    acetaminophen 325 MG tablet Commonly known as: TYLENOL Take 650 mg by mouth every 6 (six) hours as needed.   amLODipine 10 MG tablet Commonly known as: NORVASC Take 1 tablet (10 mg total) by mouth daily.   cefTRIAXone  IVPB Commonly known as: ROCEPHIN Inject 2 g into the vein daily for 28 days. Indication:  osteomyelitis First Dose: No Last Day of Therapy:  05/22/2021 Labs -  Once weekly:  CBC/D and BMP, Labs - Every other week:  ESR and CRP Method of administration: IV Push Method of administration may be changed at the discretion of home infusion pharmacist based upon assessment of the patient and/or caregiver's ability to self-administer the medication ordered.   empagliflozin 10 MG Tabs tablet Commonly known as: JARDIANCE Take by mouth daily.   gabapentin 300 MG capsule Commonly known as: NEURONTIN Take 1 capsule (300 mg total) by mouth 3 (three) times daily.   glipiZIDE 5 MG 24 hr tablet Commonly known as: GLUCOTROL XL Take 5 mg by mouth daily.   ibuprofen 200 MG tablet Commonly known as: ADVIL Take 200 mg by mouth every 6 (six) hours as needed.   lisinopril 40 MG tablet Commonly known as: ZESTRIL Take 40 mg by mouth daily.   metFORMIN  1000 MG tablet Commonly known as: GLUCOPHAGE Take 1,000 mg by mouth 2 (two) times daily.   vancomycin  IVPB Inject 1,000 mg into the vein every 12 (twelve) hours for 28 days. Indication:  osteomyelitis First Dose: No Last Day of Therapy:  05/22/2021 Labs - _0 /30/22 6979            Follow-up Information     Ralph Leyden, FNP Follow up.   Specialty: Family Medicine Why: call office make appointment for 1 week. Contact information: [place] [address] [phone]         Ralph Leyden, FNP Follow up.   Specialty: Family Medicine Contact information: [place] [address] [phone]         Aviva Signs, MD. Schedule an appointment as soon as possible for a visit on 05/08/2021.   Specialty: General Surgery Contact information: [address] [address] [phone]         [facility] Follow up.   Why: Home infusion ( Pam will educate)               Allergies  Allergen Reactions   Propofol Other (See Comments)    Patient reports "it causes me to fight them when i'm waking up"    Consultations: General surgery   Procedures/Studies: MR FOOT LEFT W WO CONTRAST  Result Date: 04/17/2021 CLINICAL DATA:  Foot swelling, nondiabetic, osteomyelitis suspected EXAM: MRI OF THE LEFT FOREFOOT WITHOUT AND WITH CONTRAST TECHNIQUE: Multiplanar, multisequence MR imaging  of the left forefoot was performed both before and after administration of intravenous contrast. CONTRAST:  44m GADAVIST GADOBUTROL 1 MMOL/ML IV SOLN COMPARISON:  X-ray 04/16/2021 FINDINGS: Technical Note: Despite efforts by the technologist and patient, motion artifact is present on today's exam and could not be eliminated. There is also poor fat saturation affecting the toes near the edge of the field of view. This reduces exam sensitivity and specificity. Bones/Joint/Cartilage No acute fracture. No malalignment. No focal  erosion or site of bone destruction. Faint marrow edema within the distal phalanx of the left great toe. Preservation of the fatty T1 marrow signal. Mild degenerative changes of the forefoot, most notably involving the tarsometatarsal joints and first metatarsal-phalangeal joint. No effusion. Ligaments Intact Lisfranc ligament. Collateral ligaments of the forefoot are intact. Muscles and Tendons Fatty atrophy of the abductor digiti minimi muscle. Otherwise preserved muscle bulk. Multifocal areas of nodular thickening of the plantar fascia likely representing fibromatosis. Intact flexor and extensor tendons. No tenosynovitis. Soft tissues Focal soft tissue swelling with edema and enhancement along the medial aspect of the great toe with prominent overlying cutaneous blister. No organized fluid collection within the soft tissues. There are multiple additional smaller cutaneous blisters predominantly along the plantar surface of the forefoot. IMPRESSION: 1. Cellulitis of the left great toe with prominent overlying cutaneous blister. Faint marrow edema within the distal phalanx of the left great toe without focal erosion, which may represent reactive osteitis versus early acute osteomyelitis. 2. Multiple additional smaller cutaneous blisters predominantly along the plantar surface of the forefoot. 3. 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 4. 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 Electronically Signed   By: Davina Poke D.O.   On: 04/17/2021 08:53   DG Chest Port 1 View  Result Date: 04/16/2021 CLINICAL DATA:  Questionable sepsis. EXAM: PORTABLE CHEST 1 VIEW COMPARISON:  Chest x-ray 11/23/2019 FINDINGS: The heart size and mediastinal contours are within normal limits. Both lungs are clear. The visualized skeletal structures are unremarkable. IMPRESSION: No active disease. Electronically Signed   By: Ronney Asters M.D.   On: 04/16/2021 22:53   DG Foot Complete Left  Result Date: 04/16/2021 CLINICAL DATA:  Bilateral foot infection. EXAM: LEFT FOOT - COMPLETE 3+ VIEW COMPARISON:  None. FINDINGS: There is no evidence of acute fracture or dislocation. A radiopaque fixation plate and screws are seen along the visualized portion of the left lateral malleolus. There is a large plantar calcaneal spur. Moderate severity degenerative changes are noted along the dorsal aspect of the proximal to mid left foot. Soft tissues are unremarkable. IMPRESSION: 1. Degenerative changes without evidence of an acute osseous abnormality. 2. Prior ORIF of the left lateral malleolus. 3. Large plantar calcaneal spur. Electronically Signed   By: Virgina Norfolk M.D.   On: 04/16/2021 23:24   DG Foot Complete Right  Result Date: 04/16/2021 CLINICAL DATA:  Bilateral foot infection. EXAM: RIGHT FOOT COMPLETE - 3+ VIEW COMPARISON:  None. FINDINGS: There is no evidence of acute fracture or dislocation. A chronic appearing deformity is seen involving the distal aspect of the proximal phalanx of the fourth right toe. There is a moderate-sized plantar calcaneal spur. Moderate severity degenerative changes are seen along the dorsal aspect of the mid right foot. Mild soft tissue swelling is seen along the dorsal aspect of the mid to distal right foot. IMPRESSION: Chronic and degenerative changes, as described above, without evidence of  acute osseous abnormality. Electronically Signed   By: Virgina Norfolk M.D.   On: 04/16/2021 23:23   [place] EKG SITE RITE  Result Date: 04/23/2021 If Site Rite image not attached, placement could not be confirmed due to current cardiac rhythm.       Discharge Exam: Vitals:   04/24/21 2201 04/25/21 0423  BP: 117/83 (!) 153/89  Pulse: 65 67  Resp:    Temp: 99 F (37.2 C) 98.1 F (36.7 C)  SpO2: 94% 97%  Vitals:   04/24/21 1301 04/24/21 1800 04/24/21 2201 04/25/21 0423  BP: 127/70  117/83 (!) 153/89  Pulse: 66  65 67  Resp: 18     Temp:   99 F (37.2 C) 98.1 F (36.7 C)  TempSrc:   Oral Oral  SpO2: (!) 89% 92% 94% 97%  Weight:    79.4 kg  Height:        General: Pt is alert, awake, not in acute distress Cardiovascular: RRR, S1/S2 +, no rubs, no gallops Respiratory: CTA bilaterally, no wheezing, no rhonchi Abdominal: Soft, NT, ND, bowel sounds + Extremities: see pics below of left foot      The results of significant diagnostics from this hospitalization (including imaging, microbiology, ancillary and laboratory) are listed below for reference.    Significant Diagnostic Studies: MR FOOT LEFT W WO CONTRAST  Result Date: 04/17/2021 CLINICAL DATA:  Foot swelling, nondiabetic, osteomyelitis suspected EXAM: MRI OF THE LEFT FOREFOOT WITHOUT AND WITH CONTRAST TECHNIQUE: Multiplanar, multisequence MR imaging of the left forefoot was performed both before and after administration of intravenous contrast. CONTRAST:  26m GADAVIST GADOBUTROL 1 MMOL/ML IV SOLN COMPARISON:  X-ray 04/16/2021 FINDINGS: Technical Note: Despite efforts by the technologist and patient, motion artifact is present on today's exam and could not be eliminated. There is also poor fat saturation affecting the toes near the edge of the field of view. This reduces exam sensitivity and specificity. Bones/Joint/Cartilage No acute fracture. No malalignment. No focal erosion or site of bone destruction. Faint marrow  edema within the distal phalanx of the left great toe. Preservation of the fatty T1 marrow signal. Mild degenerative changes of the forefoot, most notably involving the tarsometatarsal joints and first metatarsal-phalangeal joint. No effusion. Ligaments Intact Lisfranc ligament. Collateral ligaments of the forefoot are intact. Muscles and Tendons Fatty atrophy of the abductor digiti minimi muscle. Otherwise preserved muscle bulk. Multifocal areas of nodular thickening of the plantar fascia likely representing fibromatosis. Intact flexor and extensor tendons. No tenosynovitis. Soft tissues Focal soft tissue swelling with edema and enhancement along the medial aspect of the great toe with prominent overlying cutaneous blister. No organized fluid collection within the soft tissues. There are multiple additional smaller cutaneous blisters predominantly along the plantar surface of the forefoot. IMPRESSION: 1. Cellulitis of the left great toe with prominent overlying cutaneous blister. Faint marrow edema within the distal phalanx of the left great toe without focal erosion, which may represent reactive osteitis versus early acute osteomyelitis. 2. Multiple additional smaller cutaneous blisters predominantly along the plantar surface of the forefoot. 3. 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 4. 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 Electronically Signed   By: NDavina PokeD.O.   On: 04/17/2021 08:53   DG Chest Port 1 View  Result Date: 04/16/2021 CLINICAL DATA:  Questionable sepsis. EXAM: PORTABLE CHEST 1 VIEW COMPARISON:  Chest x-ray 11/23/2019 FINDINGS: The heart size and mediastinal contours are within normal limits. Both lungs are clear. The visualized skeletal structures are unremarkable. IMPRESSION: No active disease. Electronically Signed   By: ARonney AstersM.D.   On: 04/16/2021 22:53   DG Foot Complete  Left  Result Date: 04/16/2021 CLINICAL DATA:  Bilateral foot infection. EXAM: LEFT FOOT - COMPLETE 3+ VIEW COMPARISON:  None. FINDINGS: There is no evidence of acute fracture or dislocation. A radiopaque fixation plate and screws are seen along the visualized portion of the left lateral malleolus. There is a large  plantar calcaneal spur. Moderate severity degenerative changes are noted along the dorsal aspect of the proximal to mid left foot. Soft tissues are unremarkable. IMPRESSION: 1. Degenerative changes without evidence of an acute osseous abnormality. 2. Prior ORIF of the left lateral malleolus. 3. Large plantar calcaneal spur. Electronically Signed   By: Virgina Norfolk M.D.   On: 04/16/2021 23:24   DG Foot Complete Right  Result Date: 04/16/2021 CLINICAL DATA:  Bilateral foot infection. EXAM: RIGHT FOOT COMPLETE - 3+ VIEW COMPARISON:  None. FINDINGS: There is no evidence of acute fracture or dislocation. A chronic appearing deformity is seen involving the distal aspect of the proximal phalanx of the fourth right toe. There is a moderate-sized plantar calcaneal spur. Moderate severity degenerative changes are seen along the dorsal aspect of the mid right foot. Mild soft tissue swelling is seen along the dorsal aspect of the mid to distal right foot. IMPRESSION: Chronic and degenerative changes, as described above, without evidence of acute osseous abnormality. Electronically Signed   By: Virgina Norfolk M.D.   On: 04/16/2021 23:23   [place] EKG SITE RITE  Result Date: 04/23/2021 If Site Rite image not attached, placement could not be confirmed due to current cardiac rhythm.   Microbiology: Recent Results (from the past 240 hour(s))  Blood Culture (routine x 2)     Status: None   Collection Time: 04/16/21 10:57 PM   Specimen: Right Antecubital; Blood  Result Value Ref Range Status   Specimen Description RIGHT ANTECUBITAL  Final   Special Requests   Final    BOTTLES DRAWN AEROBIC AND  ANAEROBIC Blood Culture results may not be optimal due to an excessive volume of blood received in culture bottles   Culture   Final    NO GROWTH 5 DAYS Performed at [facility], [address] [place], [place] [address]    Report Status 04/21/2021 FINAL  Final  Blood Culture (routine x 2)     Status: None   Collection Time: 04/16/21 10:57 PM   Specimen: Left Antecubital; Blood  Result Value Ref Range Status   Specimen Description LEFT ANTECUBITAL  Final   Special Requests   Final    BOTTLES DRAWN AEROBIC AND ANAEROBIC Blood Culture results may not be optimal due to an excessive volume of blood received in culture bottles   Culture   Final    NO GROWTH 5 DAYS Performed at [facility], [address], [place], [place] [address]    Report Status 04/21/2021 FINAL  Final     Labs: Basic Metabolic Panel: Recent Labs  Lab 04/22/21 0517 04/24/21 0543  NA 136 135  K 4.2 4.7  CL 103 100  CO2 27 28  GLUCOSE 166* 123*  BUN 20 22  CREATININE 0.46 0.56  CALCIUM 9.7 10.0   Liver Function Tests: No results for input(s): AST, ALT, ALKPHOS, BILITOT, PROT, ALBUMIN in the last 168 hours. No results for input(s): LIPASE, AMYLASE in the last 168 hours. No results for input(s): AMMONIA in the last 168 hours. CBC: Recent Labs  Lab 04/22/21 0517 04/24/21 0543  WBC 12.6* 14.4*  HGB 14.4 14.3  HCT 44.6 43.5  MCV 94.1 96.0  PLT 221 224   Cardiac Enzymes: No results for input(s): CKTOTAL, CKMB, CKMBINDEX, TROPONINI in the last 168 hours. BNP: Invalid input(s): POCBNP CBG: Recent Labs  Lab 04/24/21 1139 04/24/21 1620 04/24/21 2202 04/25/21 0729 04/25/21 1123  GLUCAP 178* 274* 211* 190* 208*    Time coordinating discharge:  36  minutes  Signed:  Orson Eva, DO [facility] Pager: [phone] 04/25/2021, 1:31 PM

## 2021-04-25 NOTE — TOC Transition Note (Signed)
Transition of Care [facility]) - CM/SW Discharge Note   Patient Details  Name: BREN BORYS MRN: [id number] Date of Birth: 02-15-1956  [facility]) CM/SW Contact:  Leitha Bleak, RN Phone Number: 04/25/2021, 2:10 PM   Clinical Narrative:   Patient is discharging home. Jeri Modena will be on site at 3:30 to complete education and update patient on out patient treatment plan.   Final next level of care: Home/Self Care Barriers to Discharge: Barriers Resolved  Patient Goals and CMS Choice Patient states their goals for this hospitalization and ongoing recovery are:: to go home. CMS Medicare.gov Compare Post Acute Care list provided to:: Patient Choice offered to / list presented to : Patient  Discharge Placement          Patient and family notified of of transfer: 04/25/21  Discharge Plan and Services      [facility] Agency: [facility] Date [facility] Agency Contacted: 04/24/21 Time HH Agency Contacted: [phone] Representative spoke with at [facility] Agency: Jeri Modena  Readmission Risk Interventions No flowsheet data found.

## 2021-04-25 NOTE — Progress Notes (Signed)
Wound care was performed and cream applied.

## 2021-04-25 NOTE — TOC Progression Note (Addendum)
Transition of Care [facility]) - Progression Note    Patient Details  Name: Maria Leblanc MRN: [id number] Date of Birth: [birth year]  Transition of Care [facility]) CM/SW Contact  Leitha Bleak, RN Phone Number: 04/25/2021, 4:36 PM  Clinical Narrative:    Jeri Modena at the bedside to educate. TOC calling to confirm patient is good to go. Pam is asking about outpatient appointment. No home health to follow patient with OPAT order. TOC discussed with Dr. Arbutus Leas and [facility] supervisor. Patient will not discharge. We do not have a policy or plan for anyone to follow patients labs. Day surgery is closed no way to schedule appointments needed.   TOC called [facility] to understand what is needed to help patient discharge home. Instructed to call [facility]. Riki Rusk was familiar with this patient and agrees there is a gap in care and no policy for who will follow patients labs.  ID does not follow OPAT therapy. They may agree if  patient would come in for an assessment. TOC ask patient if she had transportation to [place], She does depending on the day. TOC to follow.    Addendum: No safe discharge plan for patient to get labs, and PICC line changes, TOC to follow for DC plan Monday. Updated Jeri Modena to hold home delivery of medication.   Expected Discharge Plan: Home/Self Care Barriers to Discharge: Barriers Resolved  Expected Discharge Plan and Services Expected Discharge Plan: Home/Self Care    Expected Discharge Date: 04/25/21                  [facility] Agency: [facility] Date HH Agency Contacted: 04/24/21 Time HH Agency Contacted: [phone] Representative spoke with at [facility] Agency: Jeri Modena

## 2021-04-26 LAB — GLUCOSE, CAPILLARY
Glucose-Capillary: 161 mg/dL — ABNORMAL HIGH (ref 70–99)
Glucose-Capillary: 240 mg/dL — ABNORMAL HIGH (ref 70–99)
Glucose-Capillary: 188 mg/dL — ABNORMAL HIGH (ref 70–99)
Glucose-Capillary: 152 mg/dL — ABNORMAL HIGH (ref 70–99)

## 2021-04-26 MED ORDER — POLYETHYLENE GLYCOL 3350 17 G PO PACK
17.0000 g | PACK | Freq: Every day | ORAL | Status: DC
  Administered 2021-04-26 – 2021-04-27 (×2): 17 g via ORAL
  Filled 2021-04-26 (×4): qty 1

## 2021-04-26 MED ORDER — BISACODYL 10 MG RE SUPP
10.0000 mg | Freq: Once | RECTAL | Status: AC
  Administered 2021-04-26: 10 mg via RECTAL
  Filled 2021-04-26: qty 1

## 2021-04-26 NOTE — Progress Notes (Signed)
PROGRESS NOTE  Maria Leblanc [id number] DOB: 01-08-1956 DOA: 04/16/2021 PCP: Ralph Leyden, FNP  Brief History:  65 y.o. female, with history of diabetes mellitus type 2, hypothyroid, MR presents to ED with a chief complaint of blisters on feet.  Patient reports that started few days ago.  She is not exactly sure when.  She did present to her PCP on Tuesday.  She reports she was given a shot of some medication that she is not sure of in the office, and then started on doxycycline.  She was told that if her pain became worse come in the ER.  Her left toe then swelled up and so she came into the ER.  Patient reports that the blisters initially started on her right foot in her third and fourth toes.  They radiated to her right forefoot.  She could not walk because it was so painful.   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  On the same day of presentation her blisters noted to her left toe.  She reports her left toes become swollen and purplish.  It drains clear fluid.  Is been so painful that she has not been able to weight-bear on it.  She has had no fevers.  She had no purulent drainage.  Patient is not sure her glucose runs as she does not check it.  She reports compliance with her oral hypoglycemics, but she does not take insulin.  She reports her last hemoglobin A1c was 7.  Patient is a current smoker.  She smokes less than a pack a day and has no intention of quitting.  She does not  drink, use illicit drugs.  She is not vaccinated for COVID.  Patient did have ABI done in July.  She is not sure why.  She does not think she has a history of peripheral vascular disease.   In the ED Patient is afebrile, heart rate is normal, respiratory rate is normal, blood pressure 174/80, satting 93% Patient leukocytosis 12.3, hemoglobin stable Chemistry panel was unremarkable Blood cultures pending Left foot x-ray shows no acute osseous abnormality, large  plantar calcaneal spur Right foot x-ray shows chronic and degenerative changes as described above without evidence of acute osseous abnormality ABI in July shows normal  ankle brachial indices Patient was given Zosyn, vancomycin, LR 150 MLS per hour, fentanyl MRI suggested early osteomyelitis of left great toe.  PICC line was placed for home IV antibiotics.  Unfortunately, d/c was delayed due to care coordination problems partly due to but not limited to patient's lack of payor source  Assessment/Plan:  -bilateral foot cellulitis/diabetic foot infection/acute osteomyelitis toe -Patient has been seen by general surgery with recommendations for no surgical intervention at this time.   -Close outpatient follow-up with general surgery will be arranged at time of discharge.. -Continue as needed analgesics. -MRI left foot--faint marrow edema distal phalanx of L-great toe = osteitis/early osteomyelitis -Patient will have a significant other living with her to further assist her and to facilitate transition of care on 04/25/2021. 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 -Patient has been educated about importance of checking her feet, using appropriate shoes and keeping skin moisturized. -holding metformin and  glipizide   Essential hypertension -Continue lisinopril -Blood pressure stable -Heart healthy diet has been encouraged.   tobacco abuse -Nicotine patch has been declined -Cessation counseling provided.  feet pain -continue gabapentin            Family Communication:   no Family at bedside  Consultants:  general surgery  Code Status:  FULL   DVT Prophylaxis:  [place] Heparin   Procedures: As Listed in Progress Note  Above  Antibiotics: Cephalexin 12/26>>12/28 Doxy 12/26>>12/28 Vanc 12/28>> Ceftriaxone 12/28>>    Subjective: Patient denies fevers, chills, headache, chest pain, dyspnea, nausea, vomiting, diarrhea, abdominal pain, dysuria, hematuria, hematochezia, and melena.   Objective: Vitals:   04/26/21 0443 04/26/21 0604 04/26/21 0700 04/26/21 1304  BP: (!) 152/85   130/77  Pulse: (!) 59   66  Resp: 16   18  Temp: 98.3 F (36.8 C)   98.5 F (36.9 C)  TempSrc:      SpO2: 92%   96%  Weight:  78.8 kg 78.7 kg   Height:        Intake/Output Summary (Last 24 hours) at 04/26/2021 1603 Last data filed at 04/26/2021 1300 Gross per 24 hour  Intake 610 ml  Output --  Net 610 ml   Weight change: -0.579 kg Exam:  General:  Pt is alert, follows commands appropriately, not in acute distress HEENT: No icterus, No thrush, No neck mass, [place]/AT Cardiovascular: RRR, S1/S2, no rubs, no gallops Respiratory: CTA bilaterally, no wheezing, no crackles, no rhonchi Abdomen: Soft/+BS, non tender, non distended, no guarding Extremities: No edema, No lymphangitis, No petechiae, no synovitis   Data Reviewed: I have personally reviewed following labs and imaging studies Basic Metabolic Panel: Recent Labs  Lab 04/22/21 0517 04/24/21 0543  NA 136 135  K 4.2 4.7  CL 103 100  CO2 27 28  GLUCOSE 166* 123*  BUN 20 22  CREATININE 0.46 0.56  CALCIUM 9.7 10.0   Liver Function Tests: No results for input(s): AST, ALT, ALKPHOS, BILITOT, PROT, ALBUMIN in the last 168 hours. No results for input(s): LIPASE, AMYLASE in the last 168 hours. No results for input(s): AMMONIA in the last 168 hours. Coagulation Profile: No results for input(s): INR, PROTIME in the last 168 hours. CBC: Recent Labs  Lab 04/22/21 0517 04/24/21 0543  WBC 12.6* 14.4*  HGB 14.4 14.3  HCT 44.6 43.5  MCV 94.1 96.0  PLT 221 224   Cardiac Enzymes: No results for input(s): CKTOTAL, CKMB, CKMBINDEX, TROPONINI in the last 168  hours. BNP: Invalid input(s): POCBNP CBG: Recent Labs  Lab 04/25/21 1616 04/25/21 2138 04/26/21 0703 04/26/21 1100 04/26/21 1556  GLUCAP 197* 229* 161* 240* 188*   HbA1C: No results for input(s): HGBA1C in the last 72 hours. Urine analysis:    Component Value Date/Time   COLORURINE YELLOW 02/11/2019 0332   APPEARANCEUR CLEAR 02/11/2019 0332   LABSPEC 1.035 (H) 02/11/2019 0332   PHURINE 5.0 02/11/2019 0332   GLUCOSEU >=500 (A) 02/11/2019 0332   HGBUR SMALL (A) 02/11/2019 0332   BILIRUBINUR NEGATIVE 02/11/2019 0332   KETONESUR NEGATIVE 02/11/2019 0332   PROTEINUR NEGATIVE 02/11/2019 0332   NITRITE NEGATIVE 02/11/2019 0332   LEUKOCYTESUR MODERATE (A) 02/11/2019 0332   Sepsis Labs: @LABRCNTIP (procalcitonin:4,lacticidven:4) ) Recent Results (from the past 240 hour(s))  Blood Culture (routine x 2)     Status: None   Collection Time: 04/16/21 10:57 PM   Specimen: Right Antecubital; Blood  Result Value Ref Range Status   Specimen Description RIGHT  ANTECUBITAL  Final   Special Requests   Final    BOTTLES DRAWN AEROBIC AND ANAEROBIC Blood Culture results may not be optimal due to an excessive volume of blood received in culture bottles   Culture   Final    NO GROWTH 5 DAYS Performed at [facility], [address], [place], [place] [address]    Report Status 04/21/2021 FINAL  Final  Blood Culture (routine x 2)     Status: None   Collection Time: 04/16/21 10:57 PM   Specimen: Left Antecubital; Blood  Result Value Ref Range Status   Specimen Description LEFT ANTECUBITAL  Final   Special Requests   Final    BOTTLES DRAWN AEROBIC AND ANAEROBIC Blood Culture results may not be optimal due to an excessive volume of blood received in culture bottles   Culture   Final    NO GROWTH 5 DAYS Performed at [facility], [address], [place], [place] [address]    Report Status 04/21/2021 FINAL  Final     Scheduled Meds:  Chlorhexidine Gluconate Cloth  6 each Topical Daily    docusate sodium  100 mg Oral BID   gabapentin  300 mg Oral TID   heparin  5,000 Units Subcutaneous Q8H   insulin aspart  0-15 Units Subcutaneous TID WC   insulin aspart  0-5 Units Subcutaneous QHS   lisinopril  40 mg Oral Daily   mupirocin cream   Topical BID   polyethylene glycol  17 g Oral Daily   sodium chloride flush  10-40 mL Intracatheter Q12H   Continuous Infusions:  cefTRIAXone (ROCEPHIN)  IV 2 g (04/25/21 1726)   vancomycin 1,000 mg (04/26/21 0341)    Procedures/Studies: MR FOOT LEFT W WO CONTRAST  Result Date: 04/17/2021 CLINICAL DATA:  Foot swelling, nondiabetic, osteomyelitis suspected EXAM: MRI OF THE LEFT FOREFOOT WITHOUT AND WITH CONTRAST TECHNIQUE: Multiplanar, multisequence MR imaging of the left forefoot was performed both before and after administration of intravenous contrast. CONTRAST:  49mL GADAVIST GADOBUTROL 1 MMOL/ML IV SOLN COMPARISON:  X-ray 04/16/2021 FINDINGS: Technical Note: Despite efforts by the technologist and patient, motion artifact is present on today's exam and could not be eliminated. There is also poor fat saturation affecting the toes near the edge of the field of view. This reduces exam sensitivity and specificity. Bones/Joint/Cartilage No acute fracture. No malalignment. No focal erosion or site of bone destruction. Faint marrow edema within the distal phalanx of the left great toe. Preservation of the fatty T1 marrow signal. Mild degenerative changes of the forefoot, most notably involving the tarsometatarsal joints and first metatarsal-phalangeal joint. No effusion. Ligaments Intact Lisfranc ligament. Collateral ligaments of the forefoot are intact. Muscles and Tendons Fatty atrophy of the abductor digiti minimi muscle. Otherwise preserved muscle bulk. Multifocal areas of nodular thickening of the plantar fascia likely representing fibromatosis. Intact flexor and extensor tendons. No tenosynovitis. Soft tissues Focal soft tissue swelling with edema and  enhancement along the medial aspect of the great toe with prominent overlying cutaneous blister. No organized fluid collection within the soft tissues. There are multiple additional smaller cutaneous blisters predominantly along the plantar surface of the forefoot. IMPRESSION: 1. Cellulitis of the left great toe with prominent overlying cutaneous blister. Faint marrow edema within the distal phalanx of the left great toe without focal erosion, which may represent reactive osteitis versus early acute osteomyelitis. 2. Multiple additional smaller cutaneous blisters predominantly along the plantar surface of the forefoot. 3. Multifocal areas of nodular thickening of the  plantar fascia suggest plantar fibromatosis. 4. 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 Electronically Signed   By: Duanne Guess D.O.   On: 04/17/2021 08:53   DG Chest Port 1 View  Result Date: 04/16/2021 CLINICAL DATA:  Questionable sepsis. EXAM: PORTABLE CHEST 1 VIEW COMPARISON:  Chest x-ray 11/23/2019 FINDINGS: The heart size and mediastinal contours are within normal limits. Both lungs are clear. The visualized skeletal structures are unremarkable. IMPRESSION: No active disease. Electronically Signed   By: Darliss Cheney M.D.   On: 04/16/2021 22:53   DG Foot Complete Left  Result Date: 04/16/2021 CLINICAL DATA:  Bilateral foot infection. EXAM: LEFT FOOT - COMPLETE 3+ VIEW COMPARISON:  None. FINDINGS: There is no evidence of acute fracture or dislocation. A radiopaque fixation plate and screws are seen along the visualized portion of the left lateral malleolus. There is a large plantar calcaneal spur. Moderate severity degenerative changes are noted along the dorsal aspect of the proximal to mid left foot. Soft tissues are unremarkable. IMPRESSION: 1. Degenerative changes without evidence of an acute osseous abnormality. 2. Prior ORIF of the left lateral malleolus. 3. Large plantar calcaneal spur.  Electronically Signed   By: Aram Candela M.D.   On: 04/16/2021 23:24   DG Foot Complete Right  Result Date: 04/16/2021 CLINICAL DATA:  Bilateral foot infection. EXAM: RIGHT FOOT COMPLETE - 3+ VIEW COMPARISON:  None. FINDINGS: There is no evidence of acute fracture or dislocation. A chronic appearing deformity is seen involving the distal aspect of the proximal phalanx of the fourth right toe. There is a moderate-sized plantar calcaneal spur. Moderate severity degenerative changes are seen along the dorsal aspect of the mid right foot. Mild soft tissue swelling is seen along the dorsal aspect of the mid to distal right foot. IMPRESSION: Chronic and degenerative changes, as described above, without evidence of acute osseous abnormality. Electronically Signed   By: Aram Candela M.D.   On: 04/16/2021 23:23   [place] EKG SITE RITE  Result Date: 04/23/2021 If Site Rite image not attached, placement could not be confirmed due to current cardiac rhythm.   Catarina Hartshorn, DO  [facility]  If 7PM-7AM, please contact night-coverage [email or web address] Password [email or web address] 04/26/2021, 4:03 PM   LOS: 9 days

## 2021-04-27 LAB — GLUCOSE, CAPILLARY
Glucose-Capillary: 157 mg/dL — ABNORMAL HIGH (ref 70–99)
Glucose-Capillary: 142 mg/dL — ABNORMAL HIGH (ref 70–99)
Glucose-Capillary: 171 mg/dL — ABNORMAL HIGH (ref 70–99)
Glucose-Capillary: 342 mg/dL — ABNORMAL HIGH (ref 70–99)

## 2021-04-27 NOTE — Progress Notes (Signed)
PROGRESS NOTE  Maria MOLDENHAUER [id number] DOB: 11-04-1955 DOA: 04/16/2021 PCP: Ralph Leyden, FNP  Brief History:  66 y.o. female, with history of diabetes mellitus type 2, hypothyroid, MR presents to ED with a chief complaint of blisters on feet.  Patient reports that started few days ago.  She is not exactly sure when.  She did present to her PCP on Tuesday.  She reports she was given a shot of some medication that she is not sure of in the office, and then started on doxycycline.  She was told that if her pain became worse come in the ER.  Her left toe then swelled up and so she came into the ER.  Patient reports that the blisters initially started on her right foot in her third and fourth toes.  They radiated to her right forefoot.  She could not walk because it was so painful.   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  On the same day of presentation her blisters noted to her left toe.  She reports her left toes become swollen and purplish.  It drains clear fluid.  Is been so painful that she has not been able to weight-bear on it.  She has had no fevers.  She had no purulent drainage.  Patient is not sure her glucose runs as she does not check it.  She reports compliance with her oral hypoglycemics, but she does not take insulin.  She reports her last hemoglobin A1c was 7.  Patient is a current smoker.  She smokes less than a pack a day and has no intention of quitting.  She does not  drink, use illicit drugs.  She is not vaccinated for COVID.  Patient did have ABI done in July.    In the ED Patient is afebrile, heart rate is normal, respiratory rate is normal, blood pressure 174/80, satting 93% Patient leukocytosis 12.3, hemoglobin stable Chemistry panel was unremarkable Blood cultures pending Left foot x-ray shows no acute osseous abnormality, large plantar calcaneal spur Right foot x-ray shows chronic and degenerative changes as described  above without evidence of acute osseous abnormality ABI in July shows normal  ankle brachial indices Patient was given Zosyn, vancomycin, LR 150 MLS per hour, fentanyl MRI suggested early osteomyelitis of left great toe.  PICC line was placed for home IV antibiotics.  Unfortunately, d/c was delayed due to care coordination problems partly due to but not limited to patient's lack of payor source   Assessment/Plan:   -bilateral foot cellulitis/diabetic foot infection/acute osteomyelitis toe -Patient has been seen by general surgery with recommendations for no surgical intervention at this time.   -Close outpatient follow-up with general surgery will be arranged at time of discharge.. -Continue as needed analgesics. -MRI left foot--faint marrow edema distal phalanx of L-great toe = osteitis/early osteomyelitis -Patient will have a significant other living with her to further assist her and to facilitate transition of care on 04/25/2021. 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 -Patient has been educated about importance of checking her feet, using appropriate shoes and keeping skin moisturized. -holding metformin and glipizide   Essential hypertension -Continue lisinopril -Blood pressure stable -Heart healthy diet has been encouraged.  tobacco abuse -Nicotine patch has been declined -Cessation counseling provided.   feet pain -continue gabapentin>>increase to 600 tid                     Family Communication:   no Family at bedside   Consultants:  general surgery   Code Status:  FULL    DVT Prophylaxis:  Camp Heparin     Procedures: As Listed in Progress Note Above   Antibiotics: Cephalexin 12/26>>12/28 Doxy 12/26>>12/28 Vanc  12/28>> Ceftriaxone 12/28>>    Subjective: Patient denies fevers, chills, headache, chest pain, dyspnea, nausea, vomiting, diarrhea, abdominal pain, dysuria, hematuria, hematochezia, and melena. She still has some burning in her feet.  Objective: Vitals:   04/26/21 1304 04/26/21 2130 04/27/21 0427 04/27/21 1337  BP: 130/77 (!) 171/85 (!) 153/87 131/69  Pulse: 66 64 63 67  Resp: 18 18 18 20   Temp: 98.5 F (36.9 C) 98.4 F (36.9 C) 98.2 F (36.8 C) 98.4 F (36.9 C)  TempSrc:  Oral  Oral  SpO2: 96% 95% 92% 93%  Weight:   77.7 kg   Height:        Intake/Output Summary (Last 24 hours) at 04/27/2021 1707 Last data filed at 04/27/2021 1300 Gross per 24 hour  Intake 1799.62 ml  Output --  Net 1799.62 ml   Weight change: -1.1 kg Exam:  General:  Pt is alert, follows commands appropriately, not in acute distress HEENT: No icterus, No thrush, No neck mass, [place]/AT Cardiovascular: RRR, S1/S2, no rubs, no gallops Respiratory: bibasilar rales.  No wheeze Abdomen: Soft/+BS, non tender, non distended, no guarding Extremities: No edema, No lymphangitis, No petechiae,    Data Reviewed: I have personally reviewed following labs and imaging studies Basic Metabolic Panel: Recent Labs  Lab 04/22/21 0517 04/24/21 0543  NA 136 135  K 4.2 4.7  CL 103 100  CO2 27 28  GLUCOSE 166* 123*  BUN 20 22  CREATININE 0.46 0.56  CALCIUM 9.7 10.0   Liver Function Tests: No results for input(s): AST, ALT, ALKPHOS, BILITOT, PROT, ALBUMIN in the last 168 hours. No results for input(s): LIPASE, AMYLASE in the last 168 hours. No results for input(s): AMMONIA in the last 168 hours. Coagulation Profile: No results for input(s): INR, PROTIME in the last 168 hours. CBC: Recent Labs  Lab 04/22/21 0517 04/24/21 0543  WBC 12.6* 14.4*  HGB 14.4 14.3  HCT 44.6 43.5  MCV 94.1 96.0  PLT 221 224   Cardiac Enzymes: No results for input(s): CKTOTAL, CKMB, CKMBINDEX, TROPONINI in the last 168  hours. BNP: Invalid input(s): POCBNP CBG: Recent Labs  Lab 04/26/21 1556 04/26/21 2133 04/27/21 0731 04/27/21 1200 04/27/21 1625  GLUCAP 188* 152* 157* 142* 171*   HbA1C: No results for input(s): HGBA1C in the last 72 hours. Urine analysis:    Component Value Date/Time   COLORURINE YELLOW 02/11/2019 0332   APPEARANCEUR CLEAR 02/11/2019 0332   LABSPEC 1.035 (H) 02/11/2019 0332   PHURINE 5.0 02/11/2019 0332   GLUCOSEU >=500 (A) 02/11/2019 0332   HGBUR SMALL (A) 02/11/2019 0332   BILIRUBINUR NEGATIVE 02/11/2019 0332   KETONESUR NEGATIVE 02/11/2019 0332   PROTEINUR NEGATIVE 02/11/2019 0332   NITRITE NEGATIVE 02/11/2019 0332   LEUKOCYTESUR MODERATE (A) 02/11/2019 0332   Sepsis Labs: @LABRCNTIP (procalcitonin:4,lacticidven:4) )No results found for this or any previous visit (from the past 240 hour(s)).   Scheduled Meds:  Chlorhexidine Gluconate Cloth  6 each Topical Daily   docusate sodium  100 mg Oral  BID   gabapentin  300 mg Oral TID   heparin  5,000 Units Subcutaneous Q8H   insulin aspart  0-15 Units Subcutaneous TID WC   insulin aspart  0-5 Units Subcutaneous QHS   lisinopril  40 mg Oral Daily   mupirocin cream   Topical BID   polyethylene glycol  17 g Oral Daily   sodium chloride flush  10-40 mL Intracatheter Q12H   Continuous Infusions:  cefTRIAXone (ROCEPHIN)  IV 2 g (04/27/21 1528)   vancomycin 1,000 mg (04/27/21 1603)    Procedures/Studies: MR FOOT LEFT W WO CONTRAST  Result Date: 04/17/2021 CLINICAL DATA:  Foot swelling, nondiabetic, osteomyelitis suspected EXAM: MRI OF THE LEFT FOREFOOT WITHOUT AND WITH CONTRAST TECHNIQUE: Multiplanar, multisequence MR imaging of the left forefoot was performed both before and after administration of intravenous contrast. CONTRAST:  29mL GADAVIST GADOBUTROL 1 MMOL/ML IV SOLN COMPARISON:  X-ray 04/16/2021 FINDINGS: Technical Note: Despite efforts by the technologist and patient, motion artifact is present on today's exam and  could not be eliminated. There is also poor fat saturation affecting the toes near the edge of the field of view. This reduces exam sensitivity and specificity. Bones/Joint/Cartilage No acute fracture. No malalignment. No focal erosion or site of bone destruction. Faint marrow edema within the distal phalanx of the left great toe. Preservation of the fatty T1 marrow signal. Mild degenerative changes of the forefoot, most notably involving the tarsometatarsal joints and first metatarsal-phalangeal joint. No effusion. Ligaments Intact Lisfranc ligament. Collateral ligaments of the forefoot are intact. Muscles and Tendons Fatty atrophy of the abductor digiti minimi muscle. Otherwise preserved muscle bulk. Multifocal areas of nodular thickening of the plantar fascia likely representing fibromatosis. Intact flexor and extensor tendons. No tenosynovitis. Soft tissues Focal soft tissue swelling with edema and enhancement along the medial aspect of the great toe with prominent overlying cutaneous blister. No organized fluid collection within the soft tissues. There are multiple additional smaller cutaneous blisters predominantly along the plantar surface of the forefoot. IMPRESSION: 1. Cellulitis of the left great toe with prominent overlying cutaneous blister. Faint marrow edema within the distal phalanx of the left great toe without focal erosion, which may represent reactive osteitis versus early acute osteomyelitis. 2. Multiple additional smaller cutaneous blisters predominantly along the plantar surface of the forefoot. 3. 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 4. 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 Electronically Signed   By: Davina Poke D.O.   On: 04/17/2021 08:53   DG Chest Port 1 View  Result Date: 04/16/2021 CLINICAL DATA:  Questionable sepsis. EXAM: PORTABLE CHEST 1 VIEW COMPARISON:  Chest x-ray 11/23/2019  FINDINGS: The heart size and mediastinal contours are within normal limits. Both lungs are clear. The visualized skeletal structures are unremarkable. IMPRESSION: No active disease. Electronically Signed   By: Ronney Asters M.D.   On: 04/16/2021 22:53   DG Foot Complete Left  Result Date: 04/16/2021 CLINICAL DATA:  Bilateral foot infection. EXAM: LEFT FOOT - COMPLETE 3+ VIEW COMPARISON:  None. FINDINGS: There is no evidence of acute fracture or dislocation. A radiopaque fixation plate and screws are seen along the visualized portion of the left lateral malleolus. There is a large plantar calcaneal spur. Moderate severity degenerative changes are noted along the dorsal aspect of the proximal to mid left foot. Soft tissues are unremarkable. IMPRESSION: 1. Degenerative changes without evidence of an acute osseous abnormality. 2. Prior ORIF of the left lateral malleolus. 3.  Large plantar calcaneal spur. Electronically Signed   By: Virgina Norfolk M.D.   On: 04/16/2021 23:24   DG Foot Complete Right  Result Date: 04/16/2021 CLINICAL DATA:  Bilateral foot infection. EXAM: RIGHT FOOT COMPLETE - 3+ VIEW COMPARISON:  None. FINDINGS: There is no evidence of acute fracture or dislocation. A chronic appearing deformity is seen involving the distal aspect of the proximal phalanx of the fourth right toe. There is a moderate-sized plantar calcaneal spur. Moderate severity degenerative changes are seen along the dorsal aspect of the mid right foot. Mild soft tissue swelling is seen along the dorsal aspect of the mid to distal right foot. IMPRESSION: Chronic and degenerative changes, as described above, without evidence of acute osseous abnormality. Electronically Signed   By: Virgina Norfolk M.D.   On: 04/16/2021 23:23   [place] EKG SITE RITE  Result Date: 04/23/2021 If Site Rite image not attached, placement could not be confirmed due to current cardiac rhythm.   Orson Eva, DO  [facility]  If 7PM-7AM,  please contact night-coverage [email or web address] Password [email or web address] 04/27/2021, 5:07 PM   LOS: 10 days

## 2021-04-27 NOTE — Progress Notes (Signed)
Pharmacy Antibiotic Note  Maria Leblanc is a 66 y.o. female admitted on 04/16/2021 with  osteomyelitis .  Pharmacy has been consulted for Vancomycin dosing. MRI shows cellulitis with great toe erosion on left and possible early acute osteo.  Plan: Vancomycin 1500mg  IV loading dose then 1000 mg IV Q 12 hrs. Goal AUC 400-550. Expected AUC: 514 SCr used: 0.8 (0.46 actual)  Also on ceftriaxone 2gm IV q24h F/U cxs and clinical progress Monitor V/S, labs and levels as indicated  Height: 5\' 6"  (167.6 cm) Weight: 77.7 kg (171 lb 4.8 oz) IBW/kg (Calculated) : 59.3  Temp (24hrs), Avg:98.4 F (36.9 C), Min:98.2 F (36.8 C), Max:98.5 F (36.9 C)  Recent Labs  Lab 04/22/21 0517 04/24/21 0543 04/25/21 1533  WBC 12.6* 14.4*  --   CREATININE 0.46 0.56  --   VANCOTROUGH  --   --  14*     Estimated Creatinine Clearance: 74.8 mL/min (by C-G formula based on SCr of 0.56 mg/dL).    Allergies  Allergen Reactions   Propofol Other (See Comments)    Patient reports "it causes me to fight them when i'm waking up"   Antimicrobials this admission: Vancomycin  12/22 >> 12/26 restarted 12/28>> ceftriaxone 12/28 >>  Zosyn 12/21>>12/26 Cephalexin 12/26>12/28 Doxycycline 12/26> 12/28  Microbiology results: 12/21 BCx: ngtd  Thank you for allowing pharmacy to be a part of this patients care.  Hart Robinsons, PharmD Clinical Pharmacist Pager [phone] 04/27/2021 9:39 AM

## 2021-04-27 NOTE — Plan of Care (Signed)

## 2021-04-28 DIAGNOSIS — L089 Local infection of the skin and subcutaneous tissue, unspecified: Secondary | ICD-10-CM

## 2021-04-28 LAB — GLUCOSE, CAPILLARY
Glucose-Capillary: 272 mg/dL — ABNORMAL HIGH (ref 70–99)
Glucose-Capillary: 191 mg/dL — ABNORMAL HIGH (ref 70–99)
Glucose-Capillary: 155 mg/dL — ABNORMAL HIGH (ref 70–99)
Glucose-Capillary: 206 mg/dL — ABNORMAL HIGH (ref 70–99)

## 2021-04-28 LAB — BASIC METABOLIC PANEL
Anion gap: 8 (ref 5–15)
BUN: 20 mg/dL (ref 8–23)
CO2: 27 mmol/L (ref 22–32)
Calcium: 9.5 mg/dL (ref 8.9–10.3)
Chloride: 99 mmol/L (ref 98–111)
Creatinine, Ser: 0.48 mg/dL (ref 0.44–1.00)
GFR, Estimated: >60 mL/min (ref >60)
Glucose, Bld: 309 mg/dL — ABNORMAL HIGH (ref 70–99)
Potassium: 4 mmol/L (ref 3.5–5.1)
Sodium: 134 mmol/L — ABNORMAL LOW (ref 135–145)

## 2021-04-28 LAB — CBC
HCT: 40.6 % (ref 36.0–46.0)
Hemoglobin: 13.1 g/dL (ref 12.0–15.0)
MCH: 31 pg (ref 26.0–34.0)
MCHC: 32.3 g/dL (ref 30.0–36.0)
MCV: 96.2 fL (ref 80.0–100.0)
Platelets: 169 10*3/uL (ref 150–400)
RBC: 4.22 MIL/uL (ref 3.87–5.11)
RDW: 13.5 % (ref 11.5–15.5)
WBC: 11.5 10*3/uL — ABNORMAL HIGH (ref 4.0–10.5)
nRBC: 0 % (ref 0.0–0.2)

## 2021-04-28 LAB — SEDIMENTATION RATE: Sed Rate: 30 mm/hr — ABNORMAL HIGH (ref 0–22)

## 2021-04-28 LAB — C-REACTIVE PROTEIN: CRP: 1.3 mg/dL — ABNORMAL HIGH (ref <1.0)

## 2021-04-28 MED ORDER — GABAPENTIN 300 MG PO CAPS
600.0000 mg | ORAL_CAPSULE | Freq: Three times a day (TID) | ORAL | Status: DC
  Administered 2021-04-28 – 2021-04-29 (×3): 600 mg via ORAL
  Filled 2021-04-28 (×3): qty 2

## 2021-04-28 NOTE — TOC Progression Note (Signed)
Transition of Care [facility]) - Progression Note    Patient Details  Name: Maria Leblanc MRN: [id number] Date of Birth: [birth year]  Transition of Care [facility]) CM/SW Contact  Karn Cassis, [place] Phone Number: 04/28/2021, 2:02 PM  Clinical Narrative:  Discussed setting up outpatient lab appointments with supervisor this morning. LCSW left voicemail for short stay at [facility], but office is closed. TOC supervisor left voicemail with [facility], but they are also closed today due to holiday. TOC supervisor also reached out to [facility] with [facility] to find out if one visit a week could be arranged possibly with [facility]. Will follow up in AM. Pam with [facility] and MD updated.       Expected Discharge Plan: Home/Self Care Barriers to Discharge: Other (must enter comment), No Home Care Agency will accept this patient (out patient plan/ orders)  Expected Discharge Plan and Services Expected Discharge Plan: Home/Self Care         Expected Discharge Date: 04/25/21                           [facility] Agency: [facility] Date HH Agency Contacted: 04/24/21 Time HH Agency Contacted: [phone] Representative spoke with at [facility] Agency: Jeri Modena   Social Determinants of Health (SDOH) Interventions    Readmission Risk Interventions No flowsheet data found.

## 2021-04-28 NOTE — Progress Notes (Signed)
PROGRESS NOTE  Maria Leblanc XNT:[id number] DOB: 05/26/1955 DOA: 04/16/2021 PCP: Ralph Leyden, FNP  Brief History:  66 y.o. female, with history of diabetes mellitus type 2, hypothyroid, MR presents to ED with a chief complaint of blisters on feet.  Patient reports that started few days ago.  She is not exactly sure when.  She did present to her PCP on Tuesday.  She reports she was given a shot of some medication that she is not sure of in the office, and then started on doxycycline.  She was told that if her pain became worse come in the ER.  Her left toe then swelled up and so she came into the ER.  Patient reports that the blisters initially started on her right foot in her third and fourth toes.  They radiated to her right forefoot.  She could not walk because it was so painful.   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  On the same day of presentation her blisters noted to her left toe.  She reports her left toes become swollen and purplish.  It drains clear fluid.  Is been so painful that she has not been able to weight-bear on it.  She has had no fevers.  She had no purulent drainage.  Patient is not sure her glucose runs as she does not check it.  She reports compliance with her oral hypoglycemics, but she does not take insulin.  She reports her last hemoglobin A1c was 7.  Patient is a current smoker.  She smokes less than a pack a day and has no intention of quitting.  She does not  drink, use illicit drugs.  She is not vaccinated for COVID.  Patient did have ABI done in July.    In the ED Patient is afebrile, heart rate is normal, respiratory rate is normal, blood pressure 174/80, satting 93% Patient leukocytosis 12.3, hemoglobin stable Chemistry panel was unremarkable Blood cultures pending Left foot x-ray shows no acute osseous abnormality, large plantar calcaneal spur Right foot x-ray shows chronic and degenerative changes as described  above without evidence of acute osseous abnormality ABI in July shows normal  ankle brachial indices Patient was given Zosyn, vancomycin, LR 150 MLS per hour, fentanyl MRI suggested early osteomyelitis of left great toe.  PICC line was placed for home IV antibiotics.  Unfortunately, d/c was delayed due to care coordination problems partly due to but not limited to patient's lack of payor source   Assessment/Plan:   -bilateral foot cellulitis/diabetic foot infection/acute osteomyelitis toe -Patient has been seen by general surgery with recommendations for no surgical intervention at this time.   -Close outpatient follow-up with general surgery will be arranged at time of discharge.. -Continue as needed analgesics. -MRI left foot--faint marrow edema distal phalanx of L-great toe = osteitis/early osteomyelitis -Patient will have a significant other living with her to further assist her and to facilitate transition of care on 04/25/2021. 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 -Patient has been educated about importance of checking her feet, using appropriate shoes and keeping skin moisturized. -holding metformin and glipizide   Essential hypertension -Continue lisinopril -Blood pressure stable -Heart healthy diet  has been encouraged.   tobacco abuse -Nicotine patch has been declined -Cessation counseling provided.   feet pain -continue gabapentin>>increase to 600 tid                     Family Communication:   no Family at bedside   Consultants:  general surgery   Code Status:  FULL    DVT Prophylaxis:  [place] Heparin     Procedures: As Listed in Progress Note Above   Antibiotics: Cephalexin 12/26>>12/28 Doxy  12/26>>12/28 Vanc 12/28>> Ceftriaxone 12/28>>  Subjective: Patient denies fevers, chills, headache, chest pain, dyspnea, nausea, vomiting, diarrhea, abdominal pain, dysuria, hematuria, hematochezia, and melena.    Objective: Vitals:   04/27/21 2133 04/28/21 0520 04/28/21 0625 04/28/21 1250  BP: 133/71 (!) 158/75  (!) 155/80  Pulse: 71 74  67  Resp: _0 Temp: 98.3 F (36.8 C) 98.3 F (36.8 C)    TempSrc: Oral Oral    SpO2: 90% 94%  95%  Weight:   77.4 kg   Height:        Intake/Output Summary (Last 24 hours) at 04/28/2021 1641 Last data filed at 04/28/2021 1415 Gross per 24 hour  Intake 1376.18 ml  Output --  Net 1376.18 ml   Weight change: -0.3 kg Exam:  General:  Pt is alert, follows commands appropriately, not in acute distress HEENT: No icterus, No thrush, No neck mass, [place]/AT Cardiovascular: RRR, S1/S2, no rubs, no gallops Respiratory: CTA bilaterally, no wheezing, no crackles, no rhonchi Abdomen: Soft/+BS, non tender, non distended, no guarding Extremities: see pic    Data Reviewed: I have personally reviewed following labs and imaging studies Basic Metabolic Panel: Recent Labs  Lab 04/22/21 0517 04/24/21 0543 04/28/21 0603  NA 136 135 134*  K 4.2 4.7 4.0  CL 103 100 99  CO2 _1 GLUCOSE 166* 123* 309*  BUN _2 CREATININE 0.46 0.56 0.48  CALCIUM 9.7 10.0 9.5   Liver Function Tests: No results for input(s): AST, ALT, ALKPHOS, BILITOT, PROT, ALBUMIN in the last 168 hours. No results for input(s): LIPASE, AMYLASE in the last 168 hours. No results for input(s): AMMONIA in the last 168 hours. Coagulation Profile: No results for input(s): INR, PROTIME in the last 168 hours. CBC: Recent Labs  Lab 04/22/21 0517 04/24/21 0543 04/28/21 0603  WBC 12.6* 14.4* 11.5*  HGB 14.4 14.3 13.1  HCT 44.6 43.5 40.6  MCV 94.1 96.0 96.2  PLT 221 224 169   Cardiac Enzymes: No results for input(s): CKTOTAL, CKMB, CKMBINDEX, TROPONINI in the last  168 hours. BNP: Invalid input(s): POCBNP CBG: Recent Labs  Lab 04/27/21 1625 04/27/21 2136 04/28/21 0729 04/28/21 1126 04/28/21 1623  GLUCAP 171* 342* 272* 191* 155*   HbA1C: No results for input(s): HGBA1C in the last 72 hours. Urine analysis:    Component Value Date/Time   COLORURINE YELLOW 02/11/2019 0332   APPEARANCEUR CLEAR 02/11/2019 0332   LABSPEC 1.035 (H) 02/11/2019 0332   PHURINE 5.0 02/11/2019 0332   GLUCOSEU >=500 (A) 02/11/2019 0332   HGBUR SMALL (A) 02/11/2019 0332   BILIRUBINUR NEGATIVE 02/11/2019 0332   KETONESUR NEGATIVE 02/11/2019 0332   PROTEINUR NEGATIVE 02/11/2019 0332   NITRITE NEGATIVE 02/11/2019 0332   LEUKOCYTESUR MODERATE (A) 02/11/2019 0332   Sepsis Labs: _3 (procalcitonin:4,lacticidven:4) )No results found for this or any previous visit (from the past 240 hour(s)).   Scheduled Meds:  Chlorhexidine Gluconate Cloth  6 each Topical Daily  docusate sodium  100 mg Oral BID   gabapentin  300 mg Oral TID   heparin  5,000 Units Subcutaneous Q8H   insulin aspart  0-15 Units Subcutaneous TID WC   insulin aspart  0-5 Units Subcutaneous QHS   lisinopril  40 mg Oral Daily   mupirocin cream   Topical BID   polyethylene glycol  17 g Oral Daily   sodium chloride flush  10-40 mL Intracatheter Q12H   Continuous Infusions:  cefTRIAXone (ROCEPHIN)  IV 2 g (04/28/21 1539)   vancomycin 1,000 mg (04/28/21 1633)    Procedures/Studies: MR FOOT LEFT W WO CONTRAST  Result Date: 04/17/2021 CLINICAL DATA:  Foot swelling, nondiabetic, osteomyelitis suspected EXAM: MRI OF THE LEFT FOREFOOT WITHOUT AND WITH CONTRAST TECHNIQUE: Multiplanar, multisequence MR imaging of the left forefoot was performed both before and after administration of intravenous contrast. CONTRAST:  59m GADAVIST GADOBUTROL 1 MMOL/ML IV SOLN COMPARISON:  X-ray 04/16/2021 FINDINGS: Technical Note: Despite efforts by the technologist and patient, motion artifact is present on today's exam  and could not be eliminated. There is also poor fat saturation affecting the toes near the edge of the field of view. This reduces exam sensitivity and specificity. Bones/Joint/Cartilage No acute fracture. No malalignment. No focal erosion or site of bone destruction. Faint marrow edema within the distal phalanx of the left great toe. Preservation of the fatty T1 marrow signal. Mild degenerative changes of the forefoot, most notably involving the tarsometatarsal joints and first metatarsal-phalangeal joint. No effusion. Ligaments Intact Lisfranc ligament. Collateral ligaments of the forefoot are intact. Muscles and Tendons Fatty atrophy of the abductor digiti minimi muscle. Otherwise preserved muscle bulk. Multifocal areas of nodular thickening of the plantar fascia likely representing fibromatosis. Intact flexor and extensor tendons. No tenosynovitis. Soft tissues Focal soft tissue swelling with edema and enhancement along the medial aspect of the great toe with prominent overlying cutaneous blister. No organized fluid collection within the soft tissues. There are multiple additional smaller cutaneous blisters predominantly along the plantar surface of the forefoot. IMPRESSION: 1. Cellulitis of the left great toe with prominent overlying cutaneous blister. Faint marrow edema within the distal phalanx of the left great toe without focal erosion, which may represent reactive osteitis versus early acute osteomyelitis. 2. Multiple additional smaller cutaneous blisters predominantly along the plantar surface of the forefoot. 3. 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 4. 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 Electronically Signed   By: NDavina PokeD.O.   On: 04/17/2021 08:53   DG Chest Port 1 View  Result Date: 04/16/2021 CLINICAL DATA:  Questionable sepsis. EXAM: PORTABLE CHEST 1 VIEW COMPARISON:  Chest x-ray 11/23/2019  FINDINGS: The heart size and mediastinal contours are within normal limits. Both lungs are clear. The visualized skeletal structures are unremarkable. IMPRESSION: No active disease. Electronically Signed   By: ARonney AstersM.D.   On: 04/16/2021 22:53   DG Foot Complete Left  Result Date: 04/16/2021 CLINICAL DATA:  Bilateral foot infection. EXAM: LEFT FOOT - COMPLETE 3+ VIEW COMPARISON:  None. FINDINGS: There is no evidence of acute fracture or dislocation. A radiopaque fixation plate and screws are seen along the visualized portion of the left lateral malleolus. There is a large plantar calcaneal spur. Moderate severity degenerative changes are noted along the dorsal aspect of the proximal to mid left foot. Soft tissues are unremarkable. IMPRESSION: 1. Degenerative changes without evidence of an acute osseous abnormality. 2. Prior ORIF  of the left lateral malleolus. 3. Large plantar calcaneal spur. Electronically Signed   By: Virgina Norfolk M.D.   On: 04/16/2021 23:24   DG Foot Complete Right  Result Date: 04/16/2021 CLINICAL DATA:  Bilateral foot infection. EXAM: RIGHT FOOT COMPLETE - 3+ VIEW COMPARISON:  None. FINDINGS: There is no evidence of acute fracture or dislocation. A chronic appearing deformity is seen involving the distal aspect of the proximal phalanx of the fourth right toe. There is a moderate-sized plantar calcaneal spur. Moderate severity degenerative changes are seen along the dorsal aspect of the mid right foot. Mild soft tissue swelling is seen along the dorsal aspect of the mid to distal right foot. IMPRESSION: Chronic and degenerative changes, as described above, without evidence of acute osseous abnormality. Electronically Signed   By: Virgina Norfolk M.D.   On: 04/16/2021 23:23   [place] EKG SITE RITE  Result Date: 04/23/2021 If Site Rite image not attached, placement could not be confirmed due to current cardiac rhythm.   Orson Eva, DO  [facility]  If 7PM-7AM,  please contact night-coverage [email or web address] Password [email or web address] 04/28/2021, 4:41 PM   LOS: 11 days

## 2021-04-29 ENCOUNTER — Other Ambulatory Visit: Payer: Self-pay | Admitting: Internal Medicine

## 2021-04-29 DIAGNOSIS — M86172 Other acute osteomyelitis, left ankle and foot: Secondary | ICD-10-CM

## 2021-04-29 LAB — GLUCOSE, CAPILLARY
Glucose-Capillary: 158 mg/dL — ABNORMAL HIGH (ref 70–99)
Glucose-Capillary: 251 mg/dL — ABNORMAL HIGH (ref 70–99)
Glucose-Capillary: 164 mg/dL — ABNORMAL HIGH (ref 70–99)

## 2021-04-29 LAB — VANCOMYCIN, TROUGH: Vancomycin Tr: 14 ug/mL — ABNORMAL LOW (ref 15–20)

## 2021-04-29 MED ORDER — GABAPENTIN 300 MG PO CAPS: 600.0000 mg | ORAL_CAPSULE | Freq: Three times a day (TID) | ORAL | 1 refills | Status: AC

## 2021-04-29 MED ORDER — CEFTRIAXONE IV (FOR PTA / DISCHARGE USE ONLY): 2.0000 g | INTRAVENOUS | 0 refills | Status: AC

## 2021-04-29 MED ORDER — DAPTOMYCIN IV (FOR PTA / DISCHARGE USE ONLY): 500.0000 mg | INTRAVENOUS | 0 refills | Status: AC

## 2021-04-29 MED ORDER — SODIUM CHLORIDE 0.9 % IV SOLN
500.0000 mg | Freq: Every day | INTRAVENOUS | Status: DC
  Administered 2021-04-29: 500 mg via INTRAVENOUS
  Filled 2021-04-29: qty 10

## 2021-04-29 NOTE — Progress Notes (Signed)
PHARMACY CONSULT NOTE FOR:  OUTPATIENT  PARENTERAL ANTIBIOTIC THERAPY (OPAT)  Indication: osteomyelitis  Regimen: Ceftriaxone 2000 mg IV every 24 hours Daptomycin 500 mg IV every 24 hours. End date: Last day of therapy 05/28/2021  IV antibiotic discharge orders are pended. To discharging provider:  please sign these orders via discharge navigator,  Select New Orders & click on the button choice - Manage This Unsigned Work.     Thank you for allowing pharmacy to be a part of this patient's care.  Margot Ables, PharmD Clinical Pharmacist 04/29/2021 3:06 PM

## 2021-04-29 NOTE — Progress Notes (Signed)
° ° ° ° °  INFECTIOUS DISEASE ATTENDING ADDENDUM: ° ° °Date: 04/29/2021 ° °Patient name: Maria Leblanc  °Medical record number: [id number]  °Date of birth: 03/21/1956  ° °Patient with osteomyelitis of distal left phalanx context of diabetes mellitus and tobacco use. ° °Apparently surgery felt that there is no indication for intervention. ° °Is been my experience unfortunately though that these types of infections do end up requiring amputations. ° °We have been asked to help facilitate her leaving with IV antibiotics and to see us in clinic. ° °There is no culture data to go on but I think an empiric regimen of daptomycin and ceftriaxone is a reasonable  ° ° 1.Diagnosis: °Osteomyelitis ° °Culture Result: None ° °Allergies  °Allergen Reactions  ° Propofol Other (See Comments)  °  Patient reports "it causes me to fight them when i'm waking up"  ° ° °OPAT Orders °Discharge antibiotics to be given via PICC line °Discharge antibiotics: °Daptomycin and ceftriaxone °End Date: °May 28, 2021 ° °PIC Care Per Protocol: ° °Home health RN for IV administration and teaching; PICC line care and labs.   ° °Labs weekly while on IV antibiotics: °_x_ CBC with differential °_x_ BMP ° °x__ CRP °_x_ ESR ° °_x_ CK ° °_x_ Please pull PIC at completion of IV antibiotics °__ Please leave PIC in place until doctor has seen patient or been notified ° °Fax weekly labs to [phone] ° °Clinic Follow Up Appt: ° ° °Maria Leblanc has an appointment on 05/09/2020 at 10 AM with Dr. Manandhar. ° °[facility] is located in the [facility] at ° °[address] in [place]. ° °[address], which is located to the left of the elevators. ° °Phone: [phone] ° °Fax: [phone] ° °[email or web address] ° ° °She should arrive 30 minutes prior to her appointment. ° ° ° ° °Cornelius Van Dam °04/29/2021, 3:37 PM ° °

## 2021-04-29 NOTE — Progress Notes (Signed)
Pharmacy Antibiotic Note  Maria Leblanc is a 66 y.o. female admitted on 04/16/2021 with  osteomyelitis .  Pharmacy has been consulted for daptomycin dosing. MRI shows cellulitis with great toe erosion on left and possible early acute osteo.   Plan: Daptomycin 500 mg IV every 24 hours. Also on ceftriaxone 2gm IV q24h Plan is for 6 weeks of therapy Monitor V/S, labs and levels as indicated  Height: 5\' 6"  (167.6 cm) Weight: 78.8 kg (173 lb 12.8 oz) IBW/kg (Calculated) : 59.3  Temp (24hrs), Avg:98.4 F (36.9 C), Min:98 F (36.7 C), Max:98.8 F (37.1 C)  Recent Labs  Lab 04/24/21 0543 04/25/21 1533 04/28/21 0603 04/29/21 0348  WBC 14.4*  --  11.5*  --   CREATININE 0.56  --  0.48  --   VANCOTROUGH  --  14*  --  14*     Estimated Creatinine Clearance: 75.3 mL/min (by C-G formula based on SCr of 0.48 mg/dL).    Allergies  Allergen Reactions   Propofol Other (See Comments)    Patient reports "it causes me to fight them when i'm waking up"   Antimicrobials this admission: Dapto 1/3 >> ceftriaxone 12/28 >>  Vancomycin  12/22 >> 12/26 restarted 12/28>>1/3 Zosyn 12/21>>12/26 Cephalexin 12/26>12/28 Doxycycline 12/26> 12/28  Microbiology results: 12/21 BCx: ngf  Thank you for allowing pharmacy to be a part of this patients care.  1/22, PharmD Clinical Pharmacist 04/29/2021 2:54 PM

## 2021-04-29 NOTE — Progress Notes (Signed)
PROGRESS NOTE  Maria Leblanc:[id number] DOB: 1956/02/06 DOA: 04/16/2021 PCP: Ralph Leyden, FNP   Brief History:  66 y.o. female, with history of diabetes mellitus type 2, hypothyroid, MR presents to ED with a chief complaint of blisters on feet.  Patient reports that started few days ago.  She is not exactly sure when.  She did present to her PCP on Tuesday.  She reports she was given a shot of some medication that she is not sure of in the office, and then started on doxycycline.  She was told that if her pain became worse come in the ER.  Her left toe then swelled up and so she came into the ER.  Patient reports that the blisters initially started on her right foot in her third and fourth toes.  They radiated to her right forefoot.  She could not walk because it was so painful.   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  On the same day of presentation her blisters noted to her left toe.  She reports her left toes become swollen and purplish.  It drains clear fluid.  Is been so painful that she has not been able to weight-bear on it.  She has had no fevers.  She had no purulent drainage.  Patient is not sure her glucose runs as she does not check it.  She reports compliance with her oral hypoglycemics, but she does not take insulin.  She reports her last hemoglobin A1c was 7.  Patient is a current smoker.  She smokes less than a pack a day and has no intention of quitting.  She does not  drink, use illicit drugs.  She is not vaccinated for COVID.  Patient did have ABI done in July.    In the ED Patient is afebrile, heart rate is normal, respiratory rate is normal, blood pressure 174/80, satting 93% Patient leukocytosis 12.3, hemoglobin stable Chemistry panel was unremarkable Blood cultures pending Left foot x-ray shows no acute osseous abnormality, large plantar calcaneal spur Right foot x-ray shows chronic and degenerative changes as described  above without evidence of acute osseous abnormality ABI in July shows normal  ankle brachial indices Patient was given Zosyn, vancomycin, LR 150 MLS per hour, fentanyl MRI suggested early osteomyelitis of left great toe.  PICC line was placed for home IV antibiotics.  Unfortunately, d/c was delayed due to care coordination problems partly due to but not limited to patient's lack of payor source   Assessment/Plan:   -bilateral foot cellulitis/diabetic foot infection/acute osteomyelitis toe -Patient has been seen by general surgery with recommendations for no surgical intervention at this time.   -Close outpatient follow-up with general surgery will be arranged at time of discharge.. -Continue as needed analgesics. -MRI left foot--faint marrow edema distal phalanx of L-great toe = osteitis/early osteomyelitis -Patient will have a significant other living with her to further assist her and to facilitate transition of care on 04/25/2021. 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 -Patient has been educated about importance of checking her feet, using appropriate shoes and keeping skin moisturized. -holding metformin and glipizide during the hospitalizaion  Essential hypertension -Continue lisinopril -Blood pressure stable -Heart healthy diet has been encouraged.   tobacco abuse -Nicotine patch has been declined -Cessation counseling provided.   feet pain -continue gabapentin>>increase to 600 tid                     Family Communication:   no Family at bedside   Consultants:  general surgery   Code Status:  FULL    DVT Prophylaxis:  [place] Heparin     Procedures: As Listed in  Progress Note Above   Antibiotics: Cephalexin 12/26>>12/28 Doxy 12/26>>12/28 Vanc 12/22 to 12/26 and 12/28>> Ceftriaxone 12/28>>   Subjective: Patient denies fevers, chills, headache, chest pain, dyspnea, nausea, vomiting, diarrhea, abdominal pain, dysuria, hematuria, hematochezia, and melena.   Objective: Vitals:   04/28/21 1250 04/28/21 2057 04/29/21 0300 04/29/21 0407  BP: (!) 155/80 (!) 167/77  131/71  Pulse: 67 66  (!) 57  Resp: 18 18  18   Temp:  98.3 F (36.8 C)  98 F (36.7 C)  TempSrc:    Oral  SpO2: 95% 94%  92%  Weight:   78.8 kg   Height:        Intake/Output Summary (Last 24 hours) at 04/29/2021 1334 Last data filed at 04/29/2021 0900 Gross per 24 hour  Intake 720 ml  Output --  Net 720 ml   Weight change: 1.435 kg Exam:  General:  Pt is alert, follows commands appropriately, not in acute distress HEENT: No icterus, No thrush, No neck mass, [place]/AT Cardiovascular: RRR, S1/S2, no rubs, no gallops Respiratory: CTA bilaterally, no wheezing, no crackles, no rhonchi Abdomen: Soft/+BS, non tender, non distended, no guarding Extremities: No necrosis. No crepitance   Data Reviewed: I have personally reviewed following labs and imaging studies Basic Metabolic Panel: Recent Labs  Lab 04/24/21 0543 04/28/21 0603  NA 135 134*  K 4.7 4.0  CL 100 99  CO2 28 27  GLUCOSE 123* 309*  BUN 22 20  CREATININE 0.56 0.48  CALCIUM 10.0 9.5   Liver Function Tests: No results for input(s): AST, ALT, ALKPHOS, BILITOT, PROT, ALBUMIN in the last 168 hours. No results for input(s): LIPASE, AMYLASE in the last 168 hours. No results for input(s): AMMONIA in the last 168 hours. Coagulation Profile: No results for input(s): INR, PROTIME in the last 168 hours. CBC: Recent Labs  Lab 04/24/21 0543 04/28/21 0603  WBC 14.4* 11.5*  HGB 14.3 13.1  HCT 43.5 40.6  MCV 96.0 96.2  PLT 224 169   Cardiac Enzymes: No results for input(s): CKTOTAL, CKMB, CKMBINDEX, TROPONINI in  the last 168 hours. BNP: Invalid input(s): POCBNP CBG: Recent Labs  Lab 04/28/21 1126 04/28/21 1623 04/28/21 2044 04/29/21 0712 04/29/21 1109  GLUCAP 191* 155* 206* 158* 251*   HbA1C: No results for input(s): HGBA1C in the last 72 hours. Urine analysis:    Component Value Date/Time   COLORURINE YELLOW 02/11/2019 0332   APPEARANCEUR CLEAR 02/11/2019 0332   LABSPEC 1.035 (H) 02/11/2019 0332   PHURINE 5.0 02/11/2019 0332   GLUCOSEU >=500 (A) 02/11/2019 0332   HGBUR SMALL (A) 02/11/2019 0332   BILIRUBINUR NEGATIVE 02/11/2019 0332   KETONESUR NEGATIVE 02/11/2019 0332   PROTEINUR NEGATIVE 02/11/2019 0332   NITRITE NEGATIVE 02/11/2019 0332   LEUKOCYTESUR MODERATE (A) 02/11/2019 0332   Sepsis Labs: @LABRCNTIP (procalcitonin:4,lacticidven:4) )No results found for this or any previous visit (from the past 240 hour(s)).   Scheduled Meds:  Chlorhexidine Gluconate Cloth  6 each Topical Daily  docusate sodium  100 mg Oral BID   gabapentin  600 mg Oral TID   heparin  5,000 Units Subcutaneous Q8H   insulin aspart  0-15 Units Subcutaneous TID WC   insulin aspart  0-5 Units Subcutaneous QHS   lisinopril  40 mg Oral Daily   mupirocin cream   Topical BID   polyethylene glycol  17 g Oral Daily   sodium chloride flush  10-40 mL Intracatheter Q12H   Continuous Infusions:  cefTRIAXone (ROCEPHIN)  IV 2 g (04/28/21 1539)   vancomycin 1,000 mg (04/29/21 0532)    Procedures/Studies: MR FOOT LEFT W WO CONTRAST  Result Date: 04/17/2021 CLINICAL DATA:  Foot swelling, nondiabetic, osteomyelitis suspected EXAM: MRI OF THE LEFT FOREFOOT WITHOUT AND WITH CONTRAST TECHNIQUE: Multiplanar, multisequence MR imaging of the left forefoot was performed both before and after administration of intravenous contrast. CONTRAST:  41m GADAVIST GADOBUTROL 1 MMOL/ML IV SOLN COMPARISON:  X-ray 04/16/2021 FINDINGS: Technical Note: Despite efforts by the technologist and patient, motion artifact is present on  today's exam and could not be eliminated. There is also poor fat saturation affecting the toes near the edge of the field of view. This reduces exam sensitivity and specificity. Bones/Joint/Cartilage No acute fracture. No malalignment. No focal erosion or site of bone destruction. Faint marrow edema within the distal phalanx of the left great toe. Preservation of the fatty T1 marrow signal. Mild degenerative changes of the forefoot, most notably involving the tarsometatarsal joints and first metatarsal-phalangeal joint. No effusion. Ligaments Intact Lisfranc ligament. Collateral ligaments of the forefoot are intact. Muscles and Tendons Fatty atrophy of the abductor digiti minimi muscle. Otherwise preserved muscle bulk. Multifocal areas of nodular thickening of the plantar fascia likely representing fibromatosis. Intact flexor and extensor tendons. No tenosynovitis. Soft tissues Focal soft tissue swelling with edema and enhancement along the medial aspect of the great toe with prominent overlying cutaneous blister. No organized fluid collection within the soft tissues. There are multiple additional smaller cutaneous blisters predominantly along the plantar surface of the forefoot. IMPRESSION: 1. Cellulitis of the left great toe with prominent overlying cutaneous blister. Faint marrow edema within the distal phalanx of the left great toe without focal erosion, which may represent reactive osteitis versus early acute osteomyelitis. 2. Multiple additional smaller cutaneous blisters predominantly along the plantar surface of the forefoot. 3. 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 4. 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 Electronically Signed   By: NDavina PokeD.O.   On: 04/17/2021 08:53   DG Chest Port 1 View  Result Date: 04/16/2021 CLINICAL DATA:  Questionable sepsis. EXAM: PORTABLE CHEST 1 VIEW COMPARISON:  Chest  x-ray 11/23/2019 FINDINGS: The heart size and mediastinal contours are within normal limits. Both lungs are clear. The visualized skeletal structures are unremarkable. IMPRESSION: No active disease. Electronically Signed   By: ARonney AstersM.D.   On: 04/16/2021 22:53   DG Foot Complete Left  Result Date: 04/16/2021 CLINICAL DATA:  Bilateral foot infection. EXAM: LEFT FOOT - COMPLETE 3+ VIEW COMPARISON:  None. FINDINGS: There is no evidence of acute fracture or dislocation. A radiopaque fixation plate and screws are seen along the visualized portion of the left lateral malleolus. There is a large plantar calcaneal spur. Moderate severity degenerative changes are noted along the dorsal aspect of the proximal to mid left foot. Soft tissues are unremarkable. IMPRESSION: 1. Degenerative changes without evidence of an acute osseous abnormality. 2. Prior ORIF  of the left lateral malleolus. 3. Large plantar calcaneal spur. Electronically Signed   By: Virgina Norfolk M.D.   On: 04/16/2021 23:24   DG Foot Complete Right  Result Date: 04/16/2021 CLINICAL DATA:  Bilateral foot infection. EXAM: RIGHT FOOT COMPLETE - 3+ VIEW COMPARISON:  None. FINDINGS: There is no evidence of acute fracture or dislocation. A chronic appearing deformity is seen involving the distal aspect of the proximal phalanx of the fourth right toe. There is a moderate-sized plantar calcaneal spur. Moderate severity degenerative changes are seen along the dorsal aspect of the mid right foot. Mild soft tissue swelling is seen along the dorsal aspect of the mid to distal right foot. IMPRESSION: Chronic and degenerative changes, as described above, without evidence of acute osseous abnormality. Electronically Signed   By: Virgina Norfolk M.D.   On: 04/16/2021 23:23   [place] EKG SITE RITE  Result Date: 04/23/2021 If Site Rite image not attached, placement could not be confirmed due to current cardiac rhythm.   Orson Eva, DO  [facility]  If 7PM-7AM, please contact night-coverage [email or web address] Password [email or web address] 04/29/2021, 1:34 PM   LOS: 12 days

## 2021-04-29 NOTE — TOC Transition Note (Signed)
Transition of Care [facility]) - CM/SW Discharge Note   Patient Details  Name: Maria Leblanc MRN: [id number] Date of Birth: [birth year]  Transition of Care [facility]) CM/SW Contact:  Leitha Bleak, RN Phone Number: 04/29/2021, 4:25 PM   Clinical Narrative:   ID MD will follow labs. OPAT orders with changes have been made. Add to AVS Thursday 1/5 appointment at 12:45 here at [facility] and following 4 Thursdays, times to be given to patient.  TOC is working on getting MD to place orders. Patient updated and is calling her friend for a ride home.  RN updated to discharge after antibiotic and can be given now.      Final next level of care: Home/Self Care Barriers to Discharge: Barriers Resolved  Patient Goals and CMS Choice Patient states their goals for this hospitalization and ongoing recovery are:: to go home. CMS Medicare.gov Compare Post Acute Care list provided to:: Patient Choice offered to / list presented to : Patient  Discharge Placement      Patient to be transferred to facility by: Friend   Patient and family notified of of transfer: 04/29/21  Discharge Plan and Services    [facility] Agency: [employer] Date [facility] Agency Contacted: 04/24/21 Time HH Agency Contacted: [id number] Representative spoke with at [facility] Agency: Jeri Modena

## 2021-04-29 NOTE — Discharge Summary (Signed)
Physician Discharge Summary  Maria Leblanc UJW:[id number] DOB: 12-May-1955 DOA: 04/16/2021  PCP: Ralph Leyden, FNP  Admit date: 04/16/2021 Discharge date: 04/29/2021  Admitted From: Home Disposition:  Home   Recommendations for Outpatient Follow-up:  Follow up with PCP in 1-2 weeks Please obtain BMP/CBC in one week    Equipment/Devices: PICC Discharge Condition: Stable CODE STATUS: FULL Diet recommendation: Heart Healthy / Carb Modified   Brief/Interim Summary: 66 y.o. female, with history of diabetes mellitus type 2, hypothyroid, MR presents to ED with a chief complaint of blisters on feet.  Patient reports that started few days ago.  She is not exactly sure when.  She did present to her PCP on Tuesday.  She reports she was given a shot of some medication that she is not sure of in the office, and then started on doxycycline.  She was told that if her pain became worse come in the ER.  Her left toe then swelled up and so she came into the ER.  Patient reports that the blisters initially started on her right foot in her third and fourth toes.  They radiated to her right forefoot.  She could not walk because it was so painful.   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  On the same day of presentation her blisters noted to her left toe.  She reports her left toes become swollen and purplish.  It drains clear fluid.  Is been so painful that she has not been able to weight-bear on it.  She has had no fevers.  She had no purulent drainage.  Patient is not sure her glucose runs as she does not check it.  She reports compliance with her oral hypoglycemics, but she does not take insulin.  She reports her last hemoglobin A1c was 7.  Patient is a current smoker.  She smokes less than a pack a day and has no intention of quitting.  She does not  drink, use illicit drugs.  She is not vaccinated for COVID.  Patient did have ABI done in July.    In the ED Patient is  afebrile, heart rate is normal, respiratory rate is normal, blood pressure 174/80, satting 93% Patient leukocytosis 12.3, hemoglobin stable Chemistry panel was unremarkable Blood cultures pending Left foot x-ray shows no acute osseous abnormality, large plantar calcaneal spur Right foot x-ray shows chronic and degenerative changes as described above without evidence of acute osseous abnormality ABI in July shows normal  ankle brachial indices Patient was given Zosyn, vancomycin, LR 150 MLS per hour, fentanyl MRI suggested early osteomyelitis of left great toe.  PICC line was placed for home IV antibiotics.  Unfortunately, d/c was delayed due to care coordination problems partly due to but not limited to patient's lack of payor source  Discharge Diagnoses:  -bilateral foot cellulitis/diabetic foot infection/acute osteomyelitis toe -Patient has been seen by general surgery with recommendations for no surgical intervention at this time.   -Close outpatient follow-up with general surgery will be arranged at time of discharge.. -MRI left foot--faint marrow edema distal phalanx of L-great toe = osteitis/early osteomyelitis -Patient will have a significant other living with her to further assist her and to facilitate transition of care on 04/25/2021. -12/28 PICC line placed -unfortunately therapy has been empiric--no culture directed results -started IV vanco and ceftriaxone initially>>dapto & vanco -04/29/21--discussed with ID--Dr. Lucianne Lei Dam-->outpatient follow up set up at [facility] with Dr. 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 -Patient has been educated about importance of checking her feet, using appropriate shoes and keeping skin moisturized. -holding metformin and glipizide during the hospitalizaion   Essential hypertension -Continue lisinopril -Blood pressure stable   tobacco abuse -Nicotine patch has been declined -Cessation counseling provided.   feet pain==neuropathic pain -continue gabapentin>>increase to 600 tid    Discharge Instructions  Discharge Instructions     [facility] pharmacist to adjust dose for Vancomycin, Aminoglycosides and other anti-infective therapies as requested by physician.   Complete by: As directed    [facility] pharmacist to adjust dose for Vancomycin, Aminoglycosides and other anti-infective therapies as requested by physician.   Complete by: As directed    [facility] to provide Cath Flo 72m   Complete by: As directed    Administer for PICC line occlusion and as ordered by physician for other access device issues.   [facility] to provide Cath Flo 254m  Complete by: As directed    Administer for PICC line occlusion and as ordered by physician for other access device issues.   Anaphylaxis Kit: Provided to treat any anaphylactic reaction to the medication being provided to the patient if First Dose or when requested by physician   Complete by: As directed    Epinephrine 62m55ml vial / amp: Administer 0.41mg69m.41ml)7mbcutaneously once for moderate to severe anaphylaxis, nurse to call physician and pharmacy when reaction occurs and call 911 if needed for immediate care   Diphenhydramine 50mg/15mV vial: Administer 25-50mg I42m PRN for first dose reaction, rash, itching, mild reaction, nurse to call physician and pharmacy when reaction occurs   Sodium Chloride 0.9% NS 500ml IV441mminister if needed for hypovolemic blood pressure drop or as ordered by physician after call to physician with anaphylactic reaction    Anaphylaxis Kit: Provided to treat any anaphylactic reaction to the medication being provided to the patient if First Dose or when requested by physician   Complete by: As directed    Epinephrine 62mg/ml v34m / amp: Administer 0.41mg (0.41m70msubcu69meously once for moderate to severe anaphylaxis, nurse to call physician and pharmacy when reaction occurs and call 911 if needed for immediate care   Diphenhydramine 50mg/ml IV 441m: Administer 25-50mg IV/IM P62mor first dose reaction, rash, itching, mild reaction, nurse to call physician and pharmacy when reaction occurs   Sodium Chloride 0.9% NS 500ml IV: Admi55mer if needed for hypovolemic blood pressure drop or as ordered by physician after call to physician with anaphylactic reaction   Change dressing on IV access line weekly and PRN   Complete by: As directed    Change dressing on IV access line weekly and PRN   Complete by: As directed    Flush IV access with Sodium Chloride 0.9% and Heparin 10 units/ml or 100 units/ml   Complete by: As directed    Flush IV access with Sodium Chloride 0.9% and Heparin 10 units/ml or 100 units/ml   Complete by: As directed    Home infusion instructions - [facility]   Complete by: As directed    Instructions: Flush  IV access with Sodium Chloride 0.9% and Heparin 10units/ml or 100units/ml   Change dressing on IV access line: Weekly and PRN   Instructions Cath Flo 90m: Administer for PICC Line occlusion and as ordered by physician for other access device   [facility] pharmacist to adjust dose for: Vancomycin, Aminoglycosides and other anti-infective therapies as requested by physician   Home infusion instructions - [facility]   Complete by: As directed    Instructions: Flush IV access with Sodium Chloride 0.9% and Heparin 10units/ml or 100units/ml   Change dressing on IV access line: Weekly and PRN   Instructions Cath Flo 273m Administer for PICC Line occlusion and as ordered  by physician for other access device   [facility] pharmacist to adjust dose for: Vancomycin, Aminoglycosides and other anti-infective therapies as requested by physician   Method of administration may be changed at the discretion of home infusion pharmacist based upon assessment of the patient and/or caregivers ability to self-administer the medication ordered   Complete by: As directed    Method of administration may be changed at the discretion of home infusion pharmacist based upon assessment of the patient and/or caregivers ability to self-administer the medication ordered   Complete by: As directed       Allergies as of 04/29/2021       Reactions   Propofol Other (See Comments)   Patient reports "it causes me to fight them when i'm waking up"        Medication List     STOP taking these medications    dicyclomine 10 MG capsule Commonly known as: BENTYL   doxycycline 100 MG tablet Commonly known as: VIBRA-TABS   silver sulfADIAZINE 1 % cream Commonly known as: SILVADENE       TAKE these medications    acetaminophen 325 MG tablet Commonly known as: TYLENOL Take 650 mg by mouth every 6 (six) hours as needed.   amLODipine 10 MG tablet Commonly known as: NORVASC Take 1 tablet (10 mg total) by mouth daily.   cefTRIAXone  IVPB Commonly known as: ROCEPHIN Inject 2 g into the vein daily. Indication:  osteomyelitis First Dose: Yes Last Day of Therapy:  05/28/2021 Labs - Once weekly:  CBC/D and BMP, Labs - Every other week:  ESR and CRP Method of administration: IV Push Method of administration may be changed at the discretion of home infusion pharmacist based upon assessment of the patient and/or caregiver's ability to self-administer the medication ordered. Start taking on: April 30, 2021   daptomycin  IVPB Commonly known as: CUBICIN Inject 500 mg into the vein daily. Indication:  osteomyelitis  First Dose: Yes Last Day of Therapy:  05/28/2021 Labs -  Once weekly:  CBC/D, BMP, and CPK Labs - Every other week:  ESR and CRP Method of administration: IV Push Method of administration may be changed at the discretion of home infusion pharmacist based upon assessment of the patient and/or caregiver's ability to self-administer the medication ordered. Start taking on: April 30, 2021   empagliflozin 10 MG Tabs tablet Commonly known as: JARDIANCE Take by mouth daily.   gabapentin 300 MG capsule Commonly known as: NEURONTIN Take 2 capsules (600 mg total) by mouth 3 (three) times daily.   glipiZIDE 5 MG 24 hr tablet Commonly known as: GLUCOTROL XL Take 5 mg by mouth daily.   ibuprofen 200 MG tablet Commonly known as: ADVIL Take 200 mg by mouth every 6 (six) hours as needed.  lisinopril 40 MG tablet Commonly known as: ZESTRIL Take 40 mg by mouth daily.   metFORMIN 1000 MG tablet Commonly known as: GLUCOPHAGE Take 1,000 mg by mouth 2 (two) times daily.               Discharge Care Instructions  (From admission, onward)           Start     Ordered   04/29/21 0000  Change dressing on IV access line weekly and PRN  (Home infusion instructions - [facility] )        04/29/21 1633   04/25/21 0000  Change dressing on IV access line weekly and PRN  (Home infusion instructions - [facility] )        04/25/21 4665            Follow-up Information     Ralph Leyden, FNP Follow up.   Specialty: Family Medicine Why: call office make appointment for 1 week. Contact information: [place] [address] [phone]         Ralph Leyden, FNP Follow up.   Specialty: Family Medicine Contact information: [place] [address] [phone]         Aviva Signs, MD. Schedule an appointment as soon as possible for a visit on 05/08/2021.   Specialty: General Surgery Contact information: [address] [place] [address] [phone]         [facility]  Follow up.   Why: Home infusion ( Pam will educate)        AP-DAY SURGERY. Go in 1 day(s).   Why: Labs and Picc Line dressing changes every Thursday.  On Jan 5th appointment time is 12:45. They will advise you of the time for the other dates. Contact information: [address] [phone] Prudy Feeler [place]        Rosiland Oz, MD Follow up.   Specialty: Infectious Diseases Why: Friday 05/09/2021 at 10 AM Contact information: [address] [place] [address] [phone]                Allergies  Allergen Reactions   Propofol Other (See Comments)    Patient reports "it causes me to fight them when i'm waking up"    Consultations: General surgery   Procedures/Studies: MR FOOT LEFT W WO CONTRAST  Result Date: 04/17/2021 CLINICAL DATA:  Foot swelling, nondiabetic, osteomyelitis suspected EXAM: MRI OF THE LEFT FOREFOOT WITHOUT AND WITH CONTRAST TECHNIQUE: Multiplanar, multisequence MR imaging of the left forefoot was performed both before and after administration of intravenous contrast. CONTRAST:  51m GADAVIST GADOBUTROL 1 MMOL/ML IV SOLN COMPARISON:  X-ray 04/16/2021 FINDINGS: Technical Note: Despite efforts by the technologist and patient, motion artifact is present on today's exam and could not be eliminated. There is also poor fat saturation affecting the toes near the edge of the field of view. This reduces exam sensitivity and specificity. Bones/Joint/Cartilage No acute fracture. No malalignment. No focal erosion or site of bone destruction. Faint marrow edema within the distal phalanx of the left great toe. Preservation of the fatty T1 marrow signal. Mild degenerative changes of the forefoot, most notably involving the tarsometatarsal joints and first metatarsal-phalangeal joint. No effusion. Ligaments Intact Lisfranc ligament. Collateral ligaments of the forefoot are intact. Muscles and Tendons Fatty atrophy of  the abductor digiti minimi muscle. Otherwise preserved muscle bulk. Multifocal areas of nodular thickening of the plantar fascia likely representing fibromatosis. Intact  flexor and extensor tendons. No tenosynovitis. Soft tissues Focal soft tissue swelling with edema and enhancement along the medial aspect of the great toe with prominent overlying cutaneous blister. No organized fluid collection within the soft tissues. There are multiple additional smaller cutaneous blisters predominantly along the plantar surface of the forefoot. IMPRESSION: 1. Cellulitis of the left great toe with prominent overlying cutaneous blister. Faint marrow edema within the distal phalanx of the left great toe without focal erosion, which may represent reactive osteitis versus early acute osteomyelitis. 2. Multiple additional smaller cutaneous blisters predominantly along the plantar surface of the forefoot. 3. 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 4. 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 Electronically Signed   By: Davina Poke D.O.   On: 04/17/2021 08:53   DG Chest Port 1 View  Result Date: 04/16/2021 CLINICAL DATA:  Questionable sepsis. EXAM: PORTABLE CHEST 1 VIEW COMPARISON:  Chest x-ray 11/23/2019 FINDINGS: The heart size and mediastinal contours are within normal limits. Both lungs are clear. The visualized skeletal structures are unremarkable. IMPRESSION: No active disease. Electronically Signed   By: Ronney Asters M.D.   On: 04/16/2021 22:53   DG Foot Complete Left  Result Date: 04/16/2021 CLINICAL DATA:  Bilateral foot infection. EXAM: LEFT FOOT - COMPLETE 3+ VIEW COMPARISON:  None. FINDINGS: There is no evidence of acute fracture or dislocation. A radiopaque fixation plate and screws are seen along the visualized portion of the left lateral malleolus. There is a large plantar calcaneal spur. Moderate severity degenerative  changes are noted along the dorsal aspect of the proximal to mid left foot. Soft tissues are unremarkable. IMPRESSION: 1. Degenerative changes without evidence of an acute osseous abnormality. 2. Prior ORIF of the left lateral malleolus. 3. Large plantar calcaneal spur. Electronically Signed   By: Virgina Norfolk M.D.   On: 04/16/2021 23:24   DG Foot Complete Right  Result Date: 04/16/2021 CLINICAL DATA:  Bilateral foot infection. EXAM: RIGHT FOOT COMPLETE - 3+ VIEW COMPARISON:  None. FINDINGS: There is no evidence of acute fracture or dislocation. A chronic appearing deformity is seen involving the distal aspect of the proximal phalanx of the fourth right toe. There is a moderate-sized plantar calcaneal spur. Moderate severity degenerative changes are seen along the dorsal aspect of the mid right foot. Mild soft tissue swelling is seen along the dorsal aspect of the mid to distal right foot. IMPRESSION: Chronic and degenerative changes, as described above, without evidence of acute osseous abnormality. Electronically Signed   By: Virgina Norfolk M.D.   On: 04/16/2021 23:23   [place] EKG SITE RITE  Result Date: 04/23/2021 If Site Rite image not attached, placement could not be confirmed due to current cardiac rhythm.       Discharge Exam: Vitals:   04/29/21 0407 04/29/21 1409  BP: 131/71 123/67  Pulse: (!) 57 66  Resp: 18   Temp: 98 F (36.7 C) 98.8 F (37.1 C)  SpO2: 92% 91%   Vitals:   04/28/21 2057 04/29/21 0300 04/29/21 0407 04/29/21 1409  BP: (!) 167/77  131/71 123/67  Pulse: 66  (!) 57 66  Resp: 18  18   Temp: 98.3 F (36.8 C)  98 F (36.7 C) 98.8 F (37.1 C)  TempSrc:   Oral Oral  SpO2: 94%  92% 91%  Weight:  78.8 kg    Height:        General: Pt is alert, awake, not in  acute distress Cardiovascular: RRR, S1/S2 +, no rubs, no gallops Respiratory: CTA bilaterally, no wheezing, no rhonchi Abdominal: Soft, NT, ND, bowel sounds + Extremities: no edema, no  cyanosis   The results of significant diagnostics from this hospitalization (including imaging, microbiology, ancillary and laboratory) are listed below for reference.    Significant Diagnostic Studies: MR FOOT LEFT W WO CONTRAST  Result Date: 04/17/2021 CLINICAL DATA:  Foot swelling, nondiabetic, osteomyelitis suspected EXAM: MRI OF THE LEFT FOREFOOT WITHOUT AND WITH CONTRAST TECHNIQUE: Multiplanar, multisequence MR imaging of the left forefoot was performed both before and after administration of intravenous contrast. CONTRAST:  60m GADAVIST GADOBUTROL 1 MMOL/ML IV SOLN COMPARISON:  X-ray 04/16/2021 FINDINGS: Technical Note: Despite efforts by the technologist and patient, motion artifact is present on today's exam and could not be eliminated. There is also poor fat saturation affecting the toes near the edge of the field of view. This reduces exam sensitivity and specificity. Bones/Joint/Cartilage No acute fracture. No malalignment. No focal erosion or site of bone destruction. Faint marrow edema within the distal phalanx of the left great toe. Preservation of the fatty T1 marrow signal. Mild degenerative changes of the forefoot, most notably involving the tarsometatarsal joints and first metatarsal-phalangeal joint. No effusion. Ligaments Intact Lisfranc ligament. Collateral ligaments of the forefoot are intact. Muscles and Tendons Fatty atrophy of the abductor digiti minimi muscle. Otherwise preserved muscle bulk. Multifocal areas of nodular thickening of the plantar fascia likely representing fibromatosis. Intact flexor and extensor tendons. No tenosynovitis. Soft tissues Focal soft tissue swelling with edema and enhancement along the medial aspect of the great toe with prominent overlying cutaneous blister. No organized fluid collection within the soft tissues. There are multiple additional smaller cutaneous blisters predominantly along the plantar surface of the forefoot. IMPRESSION: 1. Cellulitis  of the left great toe with prominent overlying cutaneous blister. Faint marrow edema within the distal phalanx of the left great toe without focal erosion, which may represent reactive osteitis versus early acute osteomyelitis. 2. Multiple additional smaller cutaneous blisters predominantly along the plantar surface of the forefoot. 3. 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 4. 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 Electronically Signed   By: NDavina PokeD.O.   On: 04/17/2021 08:53   DG Chest Port 1 View  Result Date: 04/16/2021 CLINICAL DATA:  Questionable sepsis. EXAM: PORTABLE CHEST 1 VIEW COMPARISON:  Chest x-ray 11/23/2019 FINDINGS: The heart size and mediastinal contours are within normal limits. Both lungs are clear. The visualized skeletal structures are unremarkable. IMPRESSION: No active disease. Electronically Signed   By: ARonney AstersM.D.   On: 04/16/2021 22:53   DG Foot Complete Left  Result Date: 04/16/2021 CLINICAL DATA:  Bilateral foot infection. EXAM: LEFT FOOT - COMPLETE 3+ VIEW COMPARISON:  None. FINDINGS: There is no evidence of acute fracture or dislocation. A radiopaque fixation plate and screws are seen along the visualized portion of the left lateral malleolus. There is a large plantar calcaneal spur. Moderate severity degenerative changes are noted along the dorsal aspect of the proximal to mid left foot. Soft tissues are unremarkable. IMPRESSION: 1. Degenerative changes without evidence of an acute osseous abnormality. 2. Prior ORIF of the left lateral malleolus. 3. Large plantar calcaneal spur. Electronically Signed   By: TVirgina NorfolkM.D.   On: 04/16/2021 23:24   DG Foot Complete Right  Result Date: 04/16/2021 CLINICAL DATA:  Bilateral foot infection. EXAM: RIGHT FOOT COMPLETE -  3+ VIEW COMPARISON:  None. FINDINGS: There is no evidence of acute fracture or dislocation. A  chronic appearing deformity is seen involving the distal aspect of the proximal phalanx of the fourth right toe. There is a moderate-sized plantar calcaneal spur. Moderate severity degenerative changes are seen along the dorsal aspect of the mid right foot. Mild soft tissue swelling is seen along the dorsal aspect of the mid to distal right foot. IMPRESSION: Chronic and degenerative changes, as described above, without evidence of acute osseous abnormality. Electronically Signed   By: Virgina Norfolk M.D.   On: 04/16/2021 23:23   [place] EKG SITE RITE  Result Date: 04/23/2021 If Site Rite image not attached, placement could not be confirmed due to current cardiac rhythm.   Microbiology: No results found for this or any previous visit (from the past 240 hour(s)).   Labs: Basic Metabolic Panel: Recent Labs  Lab 04/24/21 0543 04/28/21 0603  NA 135 134*  K 4.7 4.0  CL 100 99  CO2 28 27  GLUCOSE 123* 309*  BUN 22 20  CREATININE 0.56 0.48  CALCIUM 10.0 9.5   Liver Function Tests: No results for input(s): AST, ALT, ALKPHOS, BILITOT, PROT, ALBUMIN in the last 168 hours. No results for input(s): LIPASE, AMYLASE in the last 168 hours. No results for input(s): AMMONIA in the last 168 hours. CBC: Recent Labs  Lab 04/24/21 0543 04/28/21 0603  WBC 14.4* 11.5*  HGB 14.3 13.1  HCT 43.5 40.6  MCV 96.0 96.2  PLT 224 169   Cardiac Enzymes: No results for input(s): CKTOTAL, CKMB, CKMBINDEX, TROPONINI in the last 168 hours. BNP: Invalid input(s): POCBNP CBG: Recent Labs  Lab 04/28/21 1623 04/28/21 2044 04/29/21 0712 04/29/21 1109 04/29/21 1616  GLUCAP 155* 206* 158* 251* 164*    Time coordinating discharge:  36 minutes  Signed:  Orson Eva, DO [facility] Pager: [phone] 04/29/2021, 5:14 PM

## 2021-04-30 ENCOUNTER — Other Ambulatory Visit (HOSPITAL_COMMUNITY): Payer: Self-pay | Admitting: Pharmacist

## 2021-04-30 MED ORDER — DOXYCYCLINE HYCLATE 100 MG PO CAPS: 100.0000 mg | ORAL_CAPSULE | Freq: Two times a day (BID) | ORAL | 1 refills | Status: AC

## 2021-05-01 ENCOUNTER — Encounter (HOSPITAL_COMMUNITY)
Admit: 2021-05-01 | Discharge: 2021-05-01 | Disposition: A | Discharge status: Home / Self Care | Payer: Self-pay | Attending: Internal Medicine | Admitting: Internal Medicine

## 2021-05-01 DIAGNOSIS — L03115 Cellulitis of right lower limb: Secondary | ICD-10-CM | POA: Insufficient documentation

## 2021-05-01 DIAGNOSIS — E1169 Type 2 diabetes mellitus with other specified complication: Secondary | ICD-10-CM | POA: Insufficient documentation

## 2021-05-01 DIAGNOSIS — M86172 Other acute osteomyelitis, left ankle and foot: Secondary | ICD-10-CM | POA: Insufficient documentation

## 2021-05-01 DIAGNOSIS — E119 Type 2 diabetes mellitus without complications: Secondary | ICD-10-CM

## 2021-05-01 DIAGNOSIS — L03116 Cellulitis of left lower limb: Secondary | ICD-10-CM | POA: Insufficient documentation

## 2021-05-01 LAB — BASIC METABOLIC PANEL
Anion gap: 10 (ref 5–15)
BUN: 15 mg/dL (ref 8–23)
CO2: 28 mmol/L (ref 22–32)
Calcium: 10.6 mg/dL — ABNORMAL HIGH (ref 8.9–10.3)
Chloride: 106 mmol/L (ref 98–111)
Creatinine, Ser: 0.64 mg/dL (ref 0.44–1.00)
GFR, Estimated: >60 mL/min (ref >60)
Glucose, Bld: 124 mg/dL — ABNORMAL HIGH (ref 70–99)
Potassium: 3.8 mmol/L (ref 3.5–5.1)
Sodium: 144 mmol/L (ref 135–145)

## 2021-05-01 LAB — CBC WITH DIFFERENTIAL/PLATELET
Abs Immature Granulocytes: 0.04 10*3/uL (ref 0.00–0.07)
Basophils Absolute: 0.1 10*3/uL (ref 0.0–0.1)
Basophils Relative: 0 %
Eosinophils Absolute: 0.2 10*3/uL (ref 0.0–0.5)
Eosinophils Relative: 2 %
HCT: 43.8 % (ref 36.0–46.0)
Hemoglobin: 14.4 g/dL (ref 12.0–15.0)
Immature Granulocytes: 0 %
Lymphocytes Relative: 11 %
Lymphs Abs: 1.5 10*3/uL (ref 0.7–4.0)
MCH: 31.5 pg (ref 26.0–34.0)
MCHC: 32.9 g/dL (ref 30.0–36.0)
MCV: 95.8 fL (ref 80.0–100.0)
Monocytes Absolute: 0.8 10*3/uL (ref 0.1–1.0)
Monocytes Relative: 6 %
Neutro Abs: 11.1 10*3/uL — ABNORMAL HIGH (ref 1.7–7.7)
Neutrophils Relative %: 81 %
Platelets: 153 10*3/uL (ref 150–400)
RBC: 4.57 MIL/uL (ref 3.87–5.11)
RDW: 13.8 % (ref 11.5–15.5)
WBC: 13.7 10*3/uL — ABNORMAL HIGH (ref 4.0–10.5)
nRBC: 0 % (ref 0.0–0.2)

## 2021-05-01 LAB — SEDIMENTATION RATE: Sed Rate: 37 mm/hr — ABNORMAL HIGH (ref 0–22)

## 2021-05-01 LAB — CK: Total CK: 32 U/L — ABNORMAL LOW (ref 38–234)

## 2021-05-01 LAB — C-REACTIVE PROTEIN: CRP: 0.7 mg/dL (ref <1.0)

## 2021-05-01 MED ORDER — HEPARIN SOD (PORK) LOCK FLUSH 100 UNIT/ML IV SOLN
250.0000 [IU] | INTRAVENOUS | Status: AC | PRN
  Administered 2021-05-01: 250 [IU]
  Filled 2021-05-01: qty 5

## 2021-05-08 ENCOUNTER — Encounter (HOSPITAL_COMMUNITY)
Admit: 2021-05-08 | Discharge: 2021-05-08 | Disposition: A | Discharge status: Home / Self Care | Payer: Self-pay | Attending: Internal Medicine | Admitting: Internal Medicine

## 2021-05-08 ENCOUNTER — Other Ambulatory Visit: Payer: Self-pay

## 2021-05-08 DIAGNOSIS — E119 Type 2 diabetes mellitus without complications: Secondary | ICD-10-CM

## 2021-05-08 DIAGNOSIS — L03119 Cellulitis of unspecified part of limb: Secondary | ICD-10-CM

## 2021-05-08 LAB — CBC WITH DIFFERENTIAL/PLATELET
Abs Immature Granulocytes: 0.03 10*3/uL (ref 0.00–0.07)
Basophils Absolute: 0.1 10*3/uL (ref 0.0–0.1)
Basophils Relative: 0 %
Eosinophils Absolute: 0.4 10*3/uL (ref 0.0–0.5)
Eosinophils Relative: 4 %
HCT: 44.8 % (ref 36.0–46.0)
Hemoglobin: 14.8 g/dL (ref 12.0–15.0)
Immature Granulocytes: 0 %
Lymphocytes Relative: 21 %
Lymphs Abs: 2.4 10*3/uL (ref 0.7–4.0)
MCH: 32 pg (ref 26.0–34.0)
MCHC: 33 g/dL (ref 30.0–36.0)
MCV: 96.8 fL (ref 80.0–100.0)
Monocytes Absolute: 0.7 10*3/uL (ref 0.1–1.0)
Monocytes Relative: 6 %
Neutro Abs: 7.8 10*3/uL — ABNORMAL HIGH (ref 1.7–7.7)
Neutrophils Relative %: 69 %
Platelets: 151 10*3/uL (ref 150–400)
RBC: 4.63 MIL/uL (ref 3.87–5.11)
RDW: 13.8 % (ref 11.5–15.5)
WBC Morphology: REACTIVE
WBC: 11.4 10*3/uL — ABNORMAL HIGH (ref 4.0–10.5)
nRBC: 0 % (ref 0.0–0.2)

## 2021-05-08 LAB — C-REACTIVE PROTEIN: CRP: 1.3 mg/dL — ABNORMAL HIGH (ref <1.0)

## 2021-05-08 LAB — BASIC METABOLIC PANEL
Anion gap: 5 (ref 5–15)
BUN: 15 mg/dL (ref 8–23)
CO2: 29 mmol/L (ref 22–32)
Calcium: 9.7 mg/dL (ref 8.9–10.3)
Chloride: 105 mmol/L (ref 98–111)
Creatinine, Ser: 0.42 mg/dL — ABNORMAL LOW (ref 0.44–1.00)
GFR, Estimated: >60 mL/min (ref >60)
Glucose, Bld: 140 mg/dL — ABNORMAL HIGH (ref 70–99)
Potassium: 4 mmol/L (ref 3.5–5.1)
Sodium: 139 mmol/L (ref 135–145)

## 2021-05-08 LAB — CK: Total CK: 40 U/L (ref 38–234)

## 2021-05-08 LAB — SEDIMENTATION RATE: Sed Rate: 15 mm/hr (ref 0–22)

## 2021-05-08 NOTE — Progress Notes (Signed)
Here for PICC line blood draw and dressing change. No redness or edema at insertion site. Blood drawn and sent to lab for results. Tolerated well.

## 2021-05-09 ENCOUNTER — Other Ambulatory Visit: Payer: Self-pay

## 2021-05-09 ENCOUNTER — Encounter: Payer: Self-pay | Admitting: Internal Medicine

## 2021-05-09 ENCOUNTER — Ambulatory Visit: Payer: Self-pay | Admitting: Infectious Diseases

## 2021-05-09 ENCOUNTER — Ambulatory Visit (INDEPENDENT_AMBULATORY_CARE_PROVIDER_SITE_OTHER): Payer: Self-pay | Admitting: Internal Medicine

## 2021-05-09 VITALS — Wt 174.0 lb

## 2021-05-09 DIAGNOSIS — M86172 Other acute osteomyelitis, left ankle and foot: Secondary | ICD-10-CM

## 2021-05-09 DIAGNOSIS — L089 Local infection of the skin and subcutaneous tissue, unspecified: Secondary | ICD-10-CM

## 2021-05-09 DIAGNOSIS — E11628 Type 2 diabetes mellitus with other skin complications: Secondary | ICD-10-CM

## 2021-05-09 NOTE — Progress Notes (Signed)
[facility]      Reason for Consult: osteomyelitis    Referring Physician: Dr. Carles Collet    Patient ID: Maria Leblanc, female    DOB: 1956-02-02, 66 y.o.   MRN: [id number]  HPI:   She is here for evaluation of osteomyelitis.  She has a history of diabetes and tobacco abuse and presented to [facility] with blisters on her feet and admitted for diabetic foot ulcer.  She was seen by Dr. Arnoldo Morale of surgery and no surgical debridement indicated and she was treated empirically.  An MRi was done on 04/17/21 concerning for osteomyelitis of the left great toe and in discussion with my partner, Dr. Tommy Medal, she was started on daptomycin and ceftriaxone (nausea with vancomycin) and plan for 6 weeks through February 1st, 2023.  She is here and no complaints.  ESR trending down from 40 to 15 and has remained flat at 1.3/0.7.  Toe is starting to dry and she has no complaints.  No drainage noted.  Now with a heel blister that is new.  Hgb A1c 7.1.    Past Medical History:  Diagnosis Date   Ankle fracture 02/18/2016   left   Borderline diabetes    Complication of anesthesia    "fighting with anesthesia when waking up"   Diabetes mellitus without complication ([place])    Diverticulosis    Headache    History of kidney stones    Hypothyroid    no longer needs med    Prior to Admission medications   Medication Sig Start Date End Date Taking? Authorizing Provider  acetaminophen (TYLENOL) 325 MG tablet Take 650 mg by mouth every 6 (six) hours as needed.   Yes [provider]  cefTRIAXone (ROCEPHIN) IVPB Inject 2 g into the vein daily. Indication:  osteomyelitis First Dose: Yes Last Day of Therapy:  05/28/2021 Labs - Once weekly:  CBC/D and BMP, Labs - Every other week:  ESR and CRP Method of administration: IV Push Method of administration may be changed at the discretion of home infusion pharmacist based upon assessment of the patient and/or caregiver's ability to  self-administer the medication ordered. 04/30/21 05/29/21 Yes Tat, Shanon Brow, MD  daptomycin (CUBICIN) IVPB Inject 500 mg into the vein daily. Indication:  osteomyelitis  First Dose: Yes Last Day of Therapy:  05/28/2021 Labs - Once weekly:  CBC/D, BMP, and CPK Labs - Every other week:  ESR and CRP Method of administration: IV Push Method of administration may be changed at the discretion of home infusion pharmacist based upon assessment of the patient and/or caregiver's ability to self-administer the medication ordered. 04/30/21 05/29/21 Yes Tat, Shanon Brow, MD  doxycycline (VIBRAMYCIN) 100 MG capsule Take 1 capsule (100 mg total) by mouth 2 (two) times daily. 04/30/21 06/29/21 Yes Truman Hayward, MD  empagliflozin (JARDIANCE) 10 MG TABS tablet Take by mouth daily.   Yes [provider]  gabapentin (NEURONTIN) 300 MG capsule Take 2 capsules (600 mg total) by mouth 3 (three) times daily. 04/29/21  Yes Tat, Shanon Brow, MD  glipiZIDE (GLUCOTROL XL) 5 MG 24 hr tablet Take 5 mg by mouth daily. 08/29/20  Yes [provider]  ibuprofen (ADVIL) 200 MG tablet Take 200 mg by mouth every 6 (six) hours as needed.   Yes [provider]  lisinopril (ZESTRIL) 40 MG tablet Take 40 mg by mouth daily. 09/04/20  Yes [provider]  metFORMIN (GLUCOPHAGE) 1000 MG tablet Take 1,000 mg by mouth  2 (two) times daily. 06/29/19  Yes [provider]  amLODipine (NORVASC) 10 MG tablet Take 1 tablet (10 mg total) by mouth daily. 10/31/20 04/17/21  Heath Lark D, DO    Allergies  Allergen Reactions   Propofol Other (See Comments)    Patient reports "it causes me to fight them when i'm waking up"    Social History   Tobacco Use   Smoking status: Every Day    Packs/day: 0.50    Years: 20.00    Pack years: 10.00    Types: Cigarettes   Smokeless tobacco: Never  Vaping Use   Vaping Use: Never used  Substance Use Topics   Alcohol use: Not Currently    Comment: rare use of liquor 3 times yearly;  denied 07/19/19   Drug use: No    Family History  Problem Relation Age of Onset   Diabetes Mother    Other Mother    Dementia Mother    Cancer Father    Heart attack Father    Diabetes Father    Other Brother     Review of Systems  Constitutional: negative for fevers and chills Gastrointestinal: negative for nausea and diarrhea Integument/breast: negative for rash All other systems reviewed and are negative    Constitutional: in no apparent distress There were no vitals filed for this visit. EYES: anicteric ENMT: no thrush Respiratory: no respiratory distress Musculoskeletal: left great toe with an open wound, necrotic skin overlying it, dry, no drainage, no surrounding erythema.   Skin: no rash   Labs: Lab Results  Component Value Date   WBC 11.4 (H) 05/08/2021   HGB 14.8 05/08/2021   HCT 44.8 05/08/2021   MCV 96.8 05/08/2021   PLT 151 05/08/2021    Lab Results  Component Value Date   CREATININE 0.42 (L) 05/08/2021   BUN 15 05/08/2021   NA 139 05/08/2021   K 4.0 05/08/2021   CL 105 05/08/2021   CO2 29 05/08/2021    Lab Results  Component Value Date   ALT 15 04/17/2021   AST 14 (L) 04/17/2021   ALKPHOS 90 04/17/2021   BILITOT 0.5 04/17/2021   INR 1.1 04/16/2021     Assessment: left great toe osteomyelitis.  On empiric therapy and inflammatory markers and clinical exam reassurring.  Will plan to continue for 6 weeks and stop and observe then off of antibiotics.   Plan: 1)  daptomycin and ceftriaxone through February 1st, 2023 2) remove picc line February 2nd or later 3) continued labs weekly including CK on daptomycin.    She otherwise can follow up here as needed.

## 2021-05-15 ENCOUNTER — Encounter (HOSPITAL_COMMUNITY)
Admission: RE | Admit: 2021-05-15 | Discharge: 2021-05-15 | Disposition: A | Discharge status: Home / Self Care | Payer: Self-pay | Source: Ambulatory Visit | Attending: Internal Medicine | Admitting: Internal Medicine

## 2021-05-15 ENCOUNTER — Encounter (HOSPITAL_COMMUNITY): Admit: 2021-05-15 | Payer: Self-pay

## 2021-05-15 ENCOUNTER — Other Ambulatory Visit: Payer: Self-pay

## 2021-05-16 ENCOUNTER — Ambulatory Visit: Payer: Self-pay | Admitting: Infectious Diseases

## 2021-05-22 ENCOUNTER — Ambulatory Visit: Payer: Self-pay | Admitting: General Surgery

## 2021-05-22 ENCOUNTER — Encounter (HOSPITAL_COMMUNITY)
Admit: 2021-05-22 | Discharge: 2021-05-22 | Disposition: A | Discharge status: Home / Self Care | Payer: Self-pay | Attending: Internal Medicine | Admitting: Internal Medicine

## 2021-05-22 ENCOUNTER — Other Ambulatory Visit: Payer: Self-pay

## 2021-05-22 ENCOUNTER — Encounter (HOSPITAL_COMMUNITY): Payer: Self-pay

## 2021-05-22 NOTE — Progress Notes (Signed)
Right arm PICC line drsg change done. Insertion site without redness or edema. Area cleansed with chlora-prep. Bio-patch applied. Sor[place]-view drsg applied. New hep-lock adapter applied. Tolerated well

## 2021-05-27 ENCOUNTER — Ambulatory Visit: Payer: Self-pay | Admitting: General Surgery

## 2021-05-29 ENCOUNTER — Encounter (HOSPITAL_COMMUNITY): Payer: Self-pay

## 2021-05-29 ENCOUNTER — Encounter (HOSPITAL_COMMUNITY)
Admission: RE | Admit: 2021-05-29 | Discharge: 2021-05-29 | Disposition: A | Discharge status: Home / Self Care | Payer: Self-pay | Source: Ambulatory Visit | Attending: Internal Medicine | Admitting: Internal Medicine

## 2021-05-29 ENCOUNTER — Other Ambulatory Visit: Payer: Self-pay

## 2021-05-29 ENCOUNTER — Ambulatory Visit (INDEPENDENT_AMBULATORY_CARE_PROVIDER_SITE_OTHER): Payer: Self-pay | Admitting: General Surgery

## 2021-05-29 ENCOUNTER — Encounter: Payer: Self-pay | Admitting: General Surgery

## 2021-05-29 VITALS — BP 165/98 | HR 85 | Temp 97.9°F | Resp 16 | Ht 65.0 in | Wt 179.0 lb

## 2021-05-29 DIAGNOSIS — L03032 Cellulitis of left toe: Secondary | ICD-10-CM

## 2021-05-29 DIAGNOSIS — E1169 Type 2 diabetes mellitus with other specified complication: Secondary | ICD-10-CM | POA: Insufficient documentation

## 2021-05-29 DIAGNOSIS — M86172 Other acute osteomyelitis, left ankle and foot: Secondary | ICD-10-CM | POA: Insufficient documentation

## 2021-05-29 DIAGNOSIS — L03116 Cellulitis of left lower limb: Secondary | ICD-10-CM | POA: Insufficient documentation

## 2021-05-29 DIAGNOSIS — L03115 Cellulitis of right lower limb: Secondary | ICD-10-CM | POA: Insufficient documentation

## 2021-05-29 NOTE — Progress Notes (Signed)
Subjective:     Maria Leblanc  Patient here for follow-up of cellulitis of the left great toe.  Patient has had no drainage from the toe.  She is just finished her IV antibiotics and her PICC line has been removed.  She has been followed by ID. Objective:    BP (!) 165/98    Pulse 85    Temp 97.9 F (36.6 C) (Other (Comment))    Resp 16    Ht 5\' 5"  (1.651 m)    Wt 179 lb (81.2 kg)    SpO2 94%    BMI 29.79 kg/m   General:  alert, cooperative, and no distress  Left great toe with dry superficial eschar present.  Mild discoloration of the toe, but no purulent drainage or erythema is present.  Palpable dorsalis pedis and posterior tibial pulses.  Healing superficial blister on left heel.   Media Information Document Information  Photos    05/29/2021 11:49  Attached To:  Office Visit on 05/29/21 with Aviva Signs, MD   Source Information  Aviva Signs, MD   [facility]        Assessment:    Left great toe cellulitis secondary to diabetes mellitus, resolving.  Finishing with IV antibiotics.  She is still taking an oral antibiotic.  No need for amputation at this point.    Plan:   Patient has acquired an orthopedic shoe to help protect her foot.  Follow-up here as needed.

## 2021-05-29 NOTE — Progress Notes (Signed)
Patient seen today in the short stay clinic and her PICC was removed. Picc was 38 cm in length on removal ,the same as it was when it was inserted. Patient did have some skin irritation under her dressing. 4 x 4 gauze and vaseline dressing was placed after removal and arm was wrapped with coban. Patient was instructed to leave dressing in place for 24 hours and if any bleeding occurred to hold pressure.Patient stated understanding of all instructions.

## 2021-09-09 ENCOUNTER — Telehealth: Payer: Self-pay | Admitting: *Deleted

## 2021-09-09 NOTE — Telephone Encounter (Signed)
Discussed with Dr. Lovell Sheehan.  ? ?Provider recommends that patient contact PCP for evaluation as the area that patient is describing is not the area that was previously treated by [facility]. The blister like lesion does not sound surgical at this time. ? ?Call placed to patient. LMTRC.  ?

## 2021-09-09 NOTE — Telephone Encounter (Signed)
Received call from patient [phone]~ telephone.  ? ?Patient reports that she noted blister like lesion on right foot near 5th digit on 09/07/2021. Reports that blister burst then and has been leaking clear fluid. Reports that area extends down the side of foot. Denies pain or purulent drainage. Patient reports that surrounding skin is red and tender. States that she is currently dressing wound with Vaseline and wrapping to keep it covered.  ? ?Patient is unable to come to office due to transportation issues. Unable to send picture for review.  ? ?Allergies: Propofol ?[facility]: [facility] ? ?Please advise.  ?

## 2021-09-10 NOTE — Telephone Encounter (Signed)
Call placed to patient. LMTRC.  

## 2021-09-11 NOTE — Telephone Encounter (Signed)
Patient made aware.   States that she has followed up with her PCP.

## 2021-09-30 ENCOUNTER — Ambulatory Visit (INDEPENDENT_AMBULATORY_CARE_PROVIDER_SITE_OTHER): Payer: Medicare Other | Admitting: General Surgery

## 2021-09-30 ENCOUNTER — Encounter: Payer: Self-pay | Admitting: *Deleted

## 2021-09-30 ENCOUNTER — Encounter: Payer: Self-pay | Admitting: General Surgery

## 2021-09-30 VITALS — BP 96/67 | HR 88 | Temp 98.8°F | Resp 14 | Ht 65.0 in | Wt 176.0 lb

## 2021-09-30 DIAGNOSIS — L089 Local infection of the skin and subcutaneous tissue, unspecified: Secondary | ICD-10-CM

## 2021-10-01 DIAGNOSIS — Z299 Encounter for prophylactic measures, unspecified: Secondary | ICD-10-CM | POA: Diagnosis not present

## 2021-10-01 DIAGNOSIS — Z6830 Body mass index (BMI) 30.0-30.9, adult: Secondary | ICD-10-CM | POA: Diagnosis not present

## 2021-10-01 DIAGNOSIS — F17210 Nicotine dependence, cigarettes, uncomplicated: Secondary | ICD-10-CM | POA: Diagnosis not present

## 2021-10-01 DIAGNOSIS — L97512 Non-pressure chronic ulcer of other part of right foot with fat layer exposed: Secondary | ICD-10-CM | POA: Diagnosis not present

## 2021-10-01 DIAGNOSIS — I10 Essential (primary) hypertension: Secondary | ICD-10-CM | POA: Diagnosis not present

## 2021-10-01 DIAGNOSIS — E1165 Type 2 diabetes mellitus with hyperglycemia: Secondary | ICD-10-CM | POA: Diagnosis not present

## 2021-10-01 NOTE — Progress Notes (Signed)
Subjective:     Maria Leblanc  Patient was referred back to my care for cellulitis of the right foot.  Patient states she developed an ulcer along the right lateral aspect of her right foot recently.  Outside x-rays were performed which did not reveal osteomyelitis.  Patient states her left great toe has been healing well. Objective:    BP 96/67   Pulse 88   Temp 98.8 F (37.1 C) (Oral)   Resp 14   Ht 5\' 5"  (1.651 m)   Wt 176 lb (79.8 kg)   SpO2 95%   BMI 29.29 kg/m   General:  alert, cooperative, and no distress  Right foot with superficial skin necrosis along the lateral aspect.  A thin eschar is present.  No purulent drainage noted.  No ulcerations noted on the toes.     Assessment:    Right diabetic foot ulceration, stable.  Resolving left great toe cellulitis Plan:   Patient has recently received Medicare, thus is now able to have ongoing care by podiatry.  The referral has been made.  I encouraged her to keep this appointment.  Follow-up here as needed.

## 2021-10-09 DIAGNOSIS — E1142 Type 2 diabetes mellitus with diabetic polyneuropathy: Secondary | ICD-10-CM | POA: Diagnosis not present

## 2021-10-09 DIAGNOSIS — L97511 Non-pressure chronic ulcer of other part of right foot limited to breakdown of skin: Secondary | ICD-10-CM | POA: Diagnosis not present

## 2021-10-13 DIAGNOSIS — M79671 Pain in right foot: Secondary | ICD-10-CM | POA: Diagnosis not present

## 2021-10-13 DIAGNOSIS — E1142 Type 2 diabetes mellitus with diabetic polyneuropathy: Secondary | ICD-10-CM | POA: Diagnosis not present

## 2021-10-13 DIAGNOSIS — L97522 Non-pressure chronic ulcer of other part of left foot with fat layer exposed: Secondary | ICD-10-CM | POA: Diagnosis not present

## 2021-10-13 DIAGNOSIS — M79672 Pain in left foot: Secondary | ICD-10-CM | POA: Diagnosis not present

## 2021-10-13 DIAGNOSIS — L97513 Non-pressure chronic ulcer of other part of right foot with necrosis of muscle: Secondary | ICD-10-CM | POA: Diagnosis not present

## 2021-10-13 DIAGNOSIS — E11621 Type 2 diabetes mellitus with foot ulcer: Secondary | ICD-10-CM | POA: Diagnosis not present

## 2021-10-22 DIAGNOSIS — M79672 Pain in left foot: Secondary | ICD-10-CM | POA: Diagnosis not present

## 2021-10-22 DIAGNOSIS — E1142 Type 2 diabetes mellitus with diabetic polyneuropathy: Secondary | ICD-10-CM | POA: Diagnosis not present

## 2021-10-22 DIAGNOSIS — M79671 Pain in right foot: Secondary | ICD-10-CM | POA: Diagnosis not present

## 2021-10-22 DIAGNOSIS — L97513 Non-pressure chronic ulcer of other part of right foot with necrosis of muscle: Secondary | ICD-10-CM | POA: Diagnosis not present

## 2021-10-22 DIAGNOSIS — L03115 Cellulitis of right lower limb: Secondary | ICD-10-CM | POA: Diagnosis not present

## 2021-10-22 DIAGNOSIS — E11621 Type 2 diabetes mellitus with foot ulcer: Secondary | ICD-10-CM | POA: Diagnosis not present

## 2021-10-22 DIAGNOSIS — E08621 Diabetes mellitus due to underlying condition with foot ulcer: Secondary | ICD-10-CM | POA: Diagnosis not present

## 2021-10-22 DIAGNOSIS — L97522 Non-pressure chronic ulcer of other part of left foot with fat layer exposed: Secondary | ICD-10-CM | POA: Diagnosis not present

## 2021-11-24 DIAGNOSIS — L97512 Non-pressure chronic ulcer of other part of right foot with fat layer exposed: Secondary | ICD-10-CM | POA: Diagnosis not present

## 2021-11-24 DIAGNOSIS — E1142 Type 2 diabetes mellitus with diabetic polyneuropathy: Secondary | ICD-10-CM | POA: Diagnosis not present

## 2021-12-18 DIAGNOSIS — L97513 Non-pressure chronic ulcer of other part of right foot with necrosis of muscle: Secondary | ICD-10-CM | POA: Diagnosis not present

## 2021-12-18 DIAGNOSIS — L97512 Non-pressure chronic ulcer of other part of right foot with fat layer exposed: Secondary | ICD-10-CM | POA: Diagnosis not present

## 2021-12-18 DIAGNOSIS — E1142 Type 2 diabetes mellitus with diabetic polyneuropathy: Secondary | ICD-10-CM | POA: Diagnosis not present

## 2021-12-18 DIAGNOSIS — E11621 Type 2 diabetes mellitus with foot ulcer: Secondary | ICD-10-CM | POA: Diagnosis not present

## 2022-01-19 DIAGNOSIS — E1142 Type 2 diabetes mellitus with diabetic polyneuropathy: Secondary | ICD-10-CM | POA: Diagnosis not present

## 2022-01-19 DIAGNOSIS — L97512 Non-pressure chronic ulcer of other part of right foot with fat layer exposed: Secondary | ICD-10-CM | POA: Diagnosis not present

## 2022-09-20 DIAGNOSIS — S91002A Unspecified open wound, left ankle, initial encounter: Secondary | ICD-10-CM | POA: Diagnosis not present

## 2022-09-20 DIAGNOSIS — X58XXXA Exposure to other specified factors, initial encounter: Secondary | ICD-10-CM | POA: Diagnosis not present

## 2022-09-20 DIAGNOSIS — R600 Localized edema: Secondary | ICD-10-CM | POA: Diagnosis not present

## 2022-09-20 DIAGNOSIS — Z888 Allergy status to other drugs, medicaments and biological substances status: Secondary | ICD-10-CM | POA: Diagnosis not present

## 2022-10-12 DIAGNOSIS — H26492 Other secondary cataract, left eye: Secondary | ICD-10-CM | POA: Diagnosis not present

## 2022-10-12 DIAGNOSIS — E113313 Type 2 diabetes mellitus with moderate nonproliferative diabetic retinopathy with macular edema, bilateral: Secondary | ICD-10-CM | POA: Diagnosis not present

## 2022-11-06 DIAGNOSIS — E119 Type 2 diabetes mellitus without complications: Secondary | ICD-10-CM | POA: Diagnosis not present

## 2022-11-06 DIAGNOSIS — Z01818 Encounter for other preprocedural examination: Secondary | ICD-10-CM | POA: Diagnosis not present

## 2022-11-06 DIAGNOSIS — E113313 Type 2 diabetes mellitus with moderate nonproliferative diabetic retinopathy with macular edema, bilateral: Secondary | ICD-10-CM | POA: Diagnosis not present

## 2022-11-06 DIAGNOSIS — H25811 Combined forms of age-related cataract, right eye: Secondary | ICD-10-CM | POA: Diagnosis not present

## 2022-11-20 DIAGNOSIS — E1165 Type 2 diabetes mellitus with hyperglycemia: Secondary | ICD-10-CM | POA: Diagnosis not present

## 2022-11-20 DIAGNOSIS — E11319 Type 2 diabetes mellitus with unspecified diabetic retinopathy without macular edema: Secondary | ICD-10-CM | POA: Diagnosis not present

## 2022-11-20 DIAGNOSIS — I10 Essential (primary) hypertension: Secondary | ICD-10-CM | POA: Diagnosis not present

## 2022-11-20 DIAGNOSIS — G5792 Unspecified mononeuropathy of left lower limb: Secondary | ICD-10-CM | POA: Diagnosis not present

## 2022-11-20 DIAGNOSIS — Z299 Encounter for prophylactic measures, unspecified: Secondary | ICD-10-CM | POA: Diagnosis not present

## 2022-11-25 DIAGNOSIS — H25811 Combined forms of age-related cataract, right eye: Secondary | ICD-10-CM | POA: Diagnosis not present

## 2022-12-16 DIAGNOSIS — E113313 Type 2 diabetes mellitus with moderate nonproliferative diabetic retinopathy with macular edema, bilateral: Secondary | ICD-10-CM | POA: Diagnosis not present

## 2022-12-22 DIAGNOSIS — Z299 Encounter for prophylactic measures, unspecified: Secondary | ICD-10-CM | POA: Diagnosis not present

## 2022-12-22 DIAGNOSIS — E1165 Type 2 diabetes mellitus with hyperglycemia: Secondary | ICD-10-CM | POA: Diagnosis not present

## 2022-12-22 DIAGNOSIS — I10 Essential (primary) hypertension: Secondary | ICD-10-CM | POA: Diagnosis not present

## 2022-12-22 DIAGNOSIS — B3731 Acute candidiasis of vulva and vagina: Secondary | ICD-10-CM | POA: Diagnosis not present

## 2022-12-22 DIAGNOSIS — E11319 Type 2 diabetes mellitus with unspecified diabetic retinopathy without macular edema: Secondary | ICD-10-CM | POA: Diagnosis not present

## 2023-12-24 ENCOUNTER — Emergency Department (HOSPITAL_COMMUNITY)
Admission: EM | Admit: 2023-12-24 | Discharge: 2023-12-24 | Disposition: A | Discharge status: Home / Self Care | Payer: Worker's Compensation | Attending: Emergency Medicine | Admitting: Emergency Medicine

## 2023-12-24 ENCOUNTER — Other Ambulatory Visit: Payer: Self-pay

## 2023-12-24 ENCOUNTER — Encounter (HOSPITAL_COMMUNITY): Payer: Self-pay | Admitting: *Deleted

## 2023-12-24 ENCOUNTER — Emergency Department (HOSPITAL_COMMUNITY): Payer: Worker's Compensation

## 2023-12-24 DIAGNOSIS — W1830XA Fall on same level, unspecified, initial encounter: Secondary | ICD-10-CM | POA: Insufficient documentation

## 2023-12-24 DIAGNOSIS — M25551 Pain in right hip: Secondary | ICD-10-CM | POA: Diagnosis present

## 2023-12-24 DIAGNOSIS — Y990 Civilian activity done for income or pay: Secondary | ICD-10-CM | POA: Diagnosis not present

## 2023-12-24 DIAGNOSIS — S7001XA Contusion of right hip, initial encounter: Secondary | ICD-10-CM | POA: Diagnosis not present

## 2023-12-24 MED ORDER — ACETAMINOPHEN 325 MG PO TABS
650.0000 mg | ORAL_TABLET | Freq: Once | ORAL | Status: AC
  Administered 2023-12-24: 650 mg via ORAL
  Filled 2023-12-24: qty 2

## 2023-12-24 MED ORDER — NAPROXEN 250 MG PO TABS
500.0000 mg | ORAL_TABLET | Freq: Once | ORAL | Status: AC
  Administered 2023-12-24: 500 mg via ORAL
  Filled 2023-12-24: qty 2

## 2023-12-24 MED ORDER — LIDOCAINE 4 % EX PTCH: 1.0000 | MEDICATED_PATCH | Freq: Two times a day (BID) | CUTANEOUS | 0 refills | Status: AC

## 2023-12-24 MED ORDER — ACETAMINOPHEN 500 MG PO TABS: 500.0000 mg | ORAL_TABLET | Freq: Four times a day (QID) | ORAL | 0 refills | Status: AC | PRN

## 2023-12-24 MED ORDER — NAPROXEN 500 MG PO TABS: 500.0000 mg | ORAL_TABLET | Freq: Two times a day (BID) | ORAL | 0 refills | Status: AC

## 2023-12-24 NOTE — Discharge Instructions (Addendum)
 Take the medicines prescribed for pain control. Read the instructions provided. Call Orthopedist for a close follow up if not getting better. Xrays show hip and knee arthritis, but no fractures.

## 2023-12-24 NOTE — ED Triage Notes (Signed)
 Pt fell 2-3 weeks ago at work, pt unable to state when, pt states she has difficultly walking and getting worse. Pt c/o right upper leg pain. Denies hitting her head. Pt states landed on a metal pipe.

## 2023-12-24 NOTE — ED Provider Notes (Signed)
 [facility] AT [facility] Provider Note   CSN: [id number] Arrival date & time: 12/24/23  1253     Patient presents with: Maria Leblanc is a 68 y.o. female.   HPI    Pt comes in with cc of fall. Pt tripped over a curve while going to work, fell onto her R side. She has been having persistent and worsening R sided hip pain since then. Sh ehas taken tylenol  w/o sig relief. Finally decided to come to the ER for check up, as pcp told her that she can't help with workers comp cases. Pt ambulating. Pain constant, with no specific evoking, aggravating or relieving factors.   Prior to Admission medications   Medication Sig Start Date End Date Taking? Authorizing Provider  acetaminophen  (TYLENOL ) 500 MG tablet Take 1 tablet (500 mg total) by mouth every 6 (six) hours as needed. 12/24/23  Yes Charlyn Sora, MD  lidocaine  4 % Place 1 patch onto the skin 2 (two) times daily. 12/24/23  Yes Charlyn Sora, MD  naproxen  (NAPROSYN ) 500 MG tablet Take 1 tablet (500 mg total) by mouth 2 (two) times daily. 12/24/23  Yes Charlyn Sora, MD  amLODipine  (NORVASC ) 10 MG tablet Take 1 tablet (10 mg total) by mouth daily. 10/31/20 09/30/21  Maree, Pratik D, DO  empagliflozin (JARDIANCE) 10 MG TABS tablet Take by mouth daily.    [provider]  gabapentin  (NEURONTIN ) 300 MG capsule Take 2 capsules (600 mg total) by mouth 3 (three) times daily. 04/29/21   Evonnie Lenis, MD  glipiZIDE (GLUCOTROL XL) 5 MG 24 hr tablet Take 5 mg by mouth daily. 08/29/20   [provider]  ibuprofen  (ADVIL ) 200 MG tablet Take 200 mg by mouth every 6 (six) hours as needed.    [provider]  lisinopril  (ZESTRIL ) 40 MG tablet Take 40 mg by mouth daily. Patient not taking: Reported on 09/30/2021 09/04/20   [provider]  metFORMIN  (GLUCOPHAGE ) 1000 MG tablet Take 1,000 mg by mouth 2 (two) times daily. 06/29/19   [provider]    Allergies: Propofol     Review  of Systems  All other systems reviewed and are negative.   Updated Vital Signs BP 104/70 (BP Location: Right Arm)   Pulse 78   Temp 98 F (36.7 C) (Oral)   Resp 18   Ht 5' 5 (1.651 m)   Wt 79.4 kg   SpO2 94%   BMI 29.12 kg/m   Physical Exam Vitals and nursing note reviewed.  Constitutional:      Appearance: She is well-developed.  HENT:     Head: Atraumatic.  Cardiovascular:     Rate and Rhythm: Normal rate.  Pulmonary:     Effort: Pulmonary effort is normal.  Musculoskeletal:     Cervical back: Normal range of motion and neck supple.     Comments: R hip tenderness, antero-lateral, abld to bear weight  Skin:    General: Skin is warm and dry.  Neurological:     Mental Status: She is alert and oriented to person, place, and time.     (all labs ordered are listed, but only abnormal results are displayed) Labs Reviewed - No data to display  EKG: None  Radiology: DG Femur Min 2 Views Right Result Date: 12/24/2023 CLINICAL DATA:  Pain after fall 2-3 weeks ago. EXAM: PELVIS - 1-2 VIEW; RIGHT FEMUR 2 VIEWS COMPARISON:  CT abdomen/pelvis dated 02/11/2019. FINDINGS: There is no evidence of  acute fracture or malalignment. Femoral heads are seated in the acetabula. Sacroiliac joints and pubic symphysis are anatomically aligned. Moderate osteoarthritis of the right knee. IMPRESSION: 1. No acute osseous abnormality. 2. Moderate osteoarthritis of the right knee. Electronically Signed   By: Harrietta Sherry M.D.   On: 12/24/2023 13:32   DG Pelvis 1-2 Views Result Date: 12/24/2023 CLINICAL DATA:  Pain after fall 2-3 weeks ago. EXAM: PELVIS - 1-2 VIEW; RIGHT FEMUR 2 VIEWS COMPARISON:  CT abdomen/pelvis dated 02/11/2019. FINDINGS: There is no evidence of acute fracture or malalignment. Femoral heads are seated in the acetabula. Sacroiliac joints and pubic symphysis are anatomically aligned. Moderate osteoarthritis of the right knee. IMPRESSION: 1. No acute osseous abnormality. 2.  Moderate osteoarthritis of the right knee. Electronically Signed   By: Harrietta Sherry M.D.   On: 12/24/2023 13:32     Procedures   Medications Ordered in the ED  naproxen  (NAPROSYN ) tablet 500 mg (has no administration in time range)  acetaminophen  (TYLENOL ) tablet 650 mg (has no administration in time range)                                    Medical Decision Making Amount and/or Complexity of Data Reviewed Radiology: ordered.  Risk OTC drugs. Prescription drug management.   68 year old patient comes in after sustaining what appears to be a mechanical fall. Pertinent past medical includes - diabetes, no renal issues and no blood thinner use.  Based on my history and exam, differential diagnosis includes: - Long bone fractures - Contusions - Soft tissue injury - Concussion -  Hematoma  Based on the initial assessment, the following workup was initiated Xray of the hip.  I have independently interpreted the following imaging from the perspective of acute trauma: xray hip And the results indicate no fracture.  The patient appears reasonably screened and/or stabilized for discharge and I doubt any other medical condition or other [facility] requiring further screening, evaluation, or treatment in the ED at this time prior to discharge.   Results from the ER workup discussed with the patient face to face and all questions answered to the best of my ability. The patient is safe for discharge with strict return precautions.    Final diagnoses:  Contusion of right hip, initial encounter  Work related injury    ED Discharge Orders          Ordered    naproxen  (NAPROSYN ) 500 MG tablet  2 times daily        12/24/23 1532    acetaminophen  (TYLENOL ) 500 MG tablet  Every 6 hours PRN        12/24/23 1532    lidocaine  4 %  2 times daily        12/24/23 1532               Charlyn Sora, MD 12/24/23 1547

## 2024-02-14 ENCOUNTER — Other Ambulatory Visit (HOSPITAL_COMMUNITY): Payer: Self-pay | Admitting: Internal Medicine

## 2024-02-14 DIAGNOSIS — K8689 Other specified diseases of pancreas: Secondary | ICD-10-CM

## 2024-02-14 DIAGNOSIS — E279 Disorder of adrenal gland, unspecified: Secondary | ICD-10-CM

## 2024-02-15 ENCOUNTER — Ambulatory Visit (HOSPITAL_COMMUNITY)
Admission: RE | Admit: 2024-02-15 | Discharge: 2024-02-15 | Disposition: A | Discharge status: Home / Self Care | Source: Ambulatory Visit | Attending: Internal Medicine | Admitting: Internal Medicine

## 2024-02-15 ENCOUNTER — Other Ambulatory Visit (HOSPITAL_COMMUNITY): Payer: Self-pay | Admitting: Internal Medicine

## 2024-02-15 DIAGNOSIS — K8689 Other specified diseases of pancreas: Secondary | ICD-10-CM

## 2024-02-15 DIAGNOSIS — E279 Disorder of adrenal gland, unspecified: Secondary | ICD-10-CM | POA: Diagnosis present

## 2024-02-15 MED ORDER — GADOBUTROL 1 MMOL/ML IV SOLN
7.5000 mL | Freq: Once | INTRAVENOUS | Status: AC | PRN
  Administered 2024-02-15: 7.5 mL via INTRAVENOUS

## 2024-03-02 ENCOUNTER — Ambulatory Visit: Admitting: Gastroenterology

## 2024-03-30 ENCOUNTER — Encounter: Payer: Self-pay | Admitting: Gastroenterology

## 2024-03-30 ENCOUNTER — Ambulatory Visit: Admitting: Gastroenterology

## 2024-03-30 VITALS — BP 135/88 | HR 73 | Temp 97.6°F | Ht 65.5 in | Wt 180.2 lb

## 2024-03-30 DIAGNOSIS — Z1211 Encounter for screening for malignant neoplasm of colon: Secondary | ICD-10-CM

## 2024-03-30 DIAGNOSIS — K219 Gastro-esophageal reflux disease without esophagitis: Secondary | ICD-10-CM

## 2024-03-30 DIAGNOSIS — R112 Nausea with vomiting, unspecified: Secondary | ICD-10-CM

## 2024-03-30 DIAGNOSIS — D490 Neoplasm of unspecified behavior of digestive system: Secondary | ICD-10-CM

## 2024-03-30 DIAGNOSIS — K5900 Constipation, unspecified: Secondary | ICD-10-CM

## 2024-03-30 MED ORDER — PANTOPRAZOLE SODIUM 40 MG PO TBEC: 40.0000 mg | DELAYED_RELEASE_TABLET | Freq: Every day | ORAL | 1 refills | Status: AC

## 2024-03-30 NOTE — Patient Instructions (Signed)
 I am going to send in pantoprazole  for you to take 40 mg once daily on empty stomach.  This will be taken preferably before breakfast.  Follow a GERD diet:  Avoid fried, fatty, greasy, spicy, citrus foods. Avoid caffeine and carbonated beverages. Avoid chocolate. Try eating 4-6 small meals a day rather than 3 large meals. Do not eat within 3 hours of laying down. Prop head of bed up on wood or bricks to create a 6 inch incline.  You should do your best to limit the intake of NSAIDs (ibuprofen , Aleve , Advil , Motrin , etc.) as this can cause worsening inflammation of the stomach.  May continue using Zofran  as needed if you have any recurrent symptoms.  For your constipation if you are not having a bowel movement at least every other day with stool softeners that I want you to start taking MiraLAX  17 g (1 capful) daily and at least 8 ounces of water or any coffee if you prefer.  We will follow-up in 2 months, sooner if needed.  It was a pleasure to see you today. I want to create trusting relationships with patients. If you receive a survey regarding your visit,  I greatly appreciate you taking time to fill this out on paper or through your MyChart. I value your feedback.  Charmaine Melia, MSN, FNP-BC, AGACNP-BC [facility]

## 2024-03-30 NOTE — Progress Notes (Signed)
 GI Office Note    Referring Provider: Leavy Waddell NOVAK, FNP Primary Care Physician:  Leavy Waddell NOVAK, FNP  Primary Gastroenterologist: Lamar HERO.Rourk, MD  Chief Complaint   Chief Complaint  Patient presents with   Nausea    Nausea and vomiting    History of Present Illness   Maria Leblanc is a 68 y.o. female presenting today at the request of Leavy Waddell NOVAK, FNP for vomiting.   Last seen in 2021 with having 2 months of left lower quadrant abdominal pain as well as associated nausea and vomiting.  He given nausea medicine and finally nausea and vomiting resolved.  Has some diarrhea but that recently resolved.  Stools runny several episodes after every meal.  Abdominal pain only subsided after the weekend but continued to have some pain this morning in the left lower quadrant.  No prior colonoscopy.  Plan for colonoscopy in the future.  Was scheduled for colonoscopy then cancelled.   Per review of her referral paperwork she has had an MRI done with and without contrast 02/15/2024 with liver measuring 18.8 cm in craniocaudal dimension without significant steatosis.  Multiple scattered T2 hyperintense simple cyst.  Evidence of cholecystectomy and dilation of the common bile duct measuring 10 mm and gradually tapering to 3 mm at the ampulla.  Does have fatty infiltration of the pancreas and pancreatic tail T2 hyperintense lesion measuring 1.4 cm connecting to the main pancreatic duct without abnormal enhancement.  Pancreatic duct is otherwise normal  MRI with and without contrast of the abdomen 02/15/2024 IMPRESSION: - Left adrenal lipid rich adenoma which does not require imaging follow-up. - Pancreatic tail cystic lesion without abnormal enhancement most consistent with side branch IPMN. Follow-up according to guidelines below. - Few scattered liver and renal simple cysts. - Cholecystectomy with dilation of common bile duct as expected in post cholecystectomy (reservoir  phenomenon). No choledocholithiasis. - Optimal imaging surveillance strategies for suspected BD-IPMNs <3 cm and without worrisome features is unclear, but the yearly incidence of transformation to pancreatic cancer is estimated at 0.4-1.1% per year: -Largest cyst less than 1 cm: CT or MRI/MRCP in 2-3 years -Largest cyst 1-2 cm: CT or MRI/MRCP annually for 2 years, then lengthen interval if no change -Largest cyst 2-3 cm: EUS in 6 months, then lengthen interval alternating MRI with EUS as appropriate consider surgery in young patients  Today:  Discussed the use of AI scribe software for clinical note transcription with the patient, who gave verbal consent to proceed.  She has been experiencing persistent nausea and vomiting for over a month, which began suddenly and was unrelenting. The nausea is constant, and she had frequent episodes of vomiting. She was prescribed Zofran  and suppositories, which eventually helped alleviate the vomiting after several months. Although the vomiting has ceased, she continues to experience a sensation of something 'sitting' in her throat, prompting her to want to spit. No current belching or upper abdominal pain, and she has no known history of acid reflux.  She mentions a fall about a month and a half ago, where she landed on a pipe at work, resulting in significant bruising and soreness, particularly when coughing. The pain is described as intense and shifting, but she does not believe it is related to her gastrointestinal symptoms.  Her medication regimen includes acetaminophen  and ibuprofen , which she takes daily, though she has difficulty recalling the exact dosage. She also takes glipizide and Jardiance for diabetes management, having discontinued metformin . She reports a lack of  appetite, feeling full quickly, and only being able to consume small portions, such as a 'kid's meal size'.  She has a history of a cystic lesion on her pancreas, which she was told was  non-cancerous. This was discovered during a prior workup involving an MRI.  She has not undergone a colonoscopy despite a previous recommendation in 2021 and expresses reluctance to do so. She has not engaged in any form of colon cancer screening, such as Cologuard but is willing to at least do that and not perform a colonoscopy.  She reports infrequent bowel movements, not occurring daily, and sometimes uses stool softeners to aid in bowel movements. She has not tried Miralax .       Wt Readings from Last 6 Encounters:  03/30/24 180 lb 3.2 oz (81.7 kg)  12/24/23 175 lb (79.4 kg)  09/30/21 176 lb (79.8 kg)  05/29/21 179 lb (81.2 kg)  05/09/21 174 lb (78.9 kg)  04/29/21 173 lb 12.8 oz (78.8 kg)    Body mass index is 29.53 kg/m.  Current Outpatient Medications  Medication Sig Dispense Refill   acetaminophen  (TYLENOL ) 500 MG tablet Take 1 tablet (500 mg total) by mouth every 6 (six) hours as needed. 30 tablet 0   amLODipine  (NORVASC ) 10 MG tablet Take 1 tablet (10 mg total) by mouth daily. 30 tablet 1   empagliflozin (JARDIANCE) 10 MG TABS tablet Take by mouth daily.     gabapentin  (NEURONTIN ) 300 MG capsule Take 2 capsules (600 mg total) by mouth 3 (three) times daily. 180 capsule 1   glipiZIDE (GLUCOTROL XL) 5 MG 24 hr tablet Take 5 mg by mouth daily.     ibuprofen  (ADVIL ) 200 MG tablet Take 200 mg by mouth every 6 (six) hours as needed.     lidocaine  4 % Place 1 patch onto the skin 2 (two) times daily. 12 patch 0   naproxen  (NAPROSYN ) 500 MG tablet Take 1 tablet (500 mg total) by mouth 2 (two) times daily. 20 tablet 0   lisinopril  (ZESTRIL ) 40 MG tablet Take 40 mg by mouth daily. (Patient not taking: Reported on 03/30/2024)     metFORMIN  (GLUCOPHAGE ) 1000 MG tablet Take 1,000 mg by mouth 2 (two) times daily. (Patient not taking: Reported on 03/30/2024)     No current facility-administered medications for this visit.    Past Medical History:  Diagnosis Date   Ankle fracture  02/18/2016   left   Borderline diabetes    Complication of anesthesia    fighting with anesthesia when waking up   Diabetes mellitus without complication (HCC)    Diverticulosis    Headache    History of kidney stones    Hypothyroid    no longer needs med    Past Surgical History:  Procedure Laterality Date   CHOLECYSTECTOMY N/A 03/29/2017   Procedure: LAPAROSCOPIC CHOLECYSTECTOMY;  Surgeon: Kallie Manuelita BROCKS, MD;  Location: [facility];  Service: General;  Laterality: N/A;   CYST REMOVAL NECK     x3   DIAGNOSTIC LAPAROSCOPY     as teenager   INCISION AND DRAINAGE PERIRECTAL ABSCESS  03/29/2017   Procedure: INCISION  AND DRAINAGE PERIRECTAL ABSCESS;  Surgeon: Kallie Manuelita BROCKS, MD;  Location: [facility];  Service: General;;   ORIF ANKLE FRACTURE Left 02/28/2016   Procedure: OPEN REDUCTION INTERNAL FIXATION (ORIF) ANKLE FRACTURE, LEFT;  Surgeon: Evalene JONETTA Chancy, MD;  Location: [facility];  Service: Orthopedics;  Laterality: Left;   TONSILLECTOMY     as child  Family History  Problem Relation Age of Onset   Diabetes Mother    Other Mother    Dementia Mother    Cancer Father    Heart attack Father    Diabetes Father    Other Brother     Allergies as of 03/30/2024 - Review Complete 03/30/2024  Allergen Reaction Noted   Propofol  Other (See Comments) 03/28/2017    Social History   Socioeconomic History   Marital status: Single    Spouse name: Not on file   Number of children: Not on file   Years of education: Not on file   Highest education level: Not on file  Occupational History   Not on file  Tobacco Use   Smoking status: Every Day    Current packs/day: 0.50    Average packs/day: 0.5 packs/day for 20.0 years (10.0 ttl pk-yrs)    Types: Cigarettes   Smokeless tobacco: Never  Vaping Use   Vaping status: Never Used  Substance and Sexual Activity   Alcohol use: Not Currently    Comment: rare use of liquor 3 times yearly; denied 07/19/19   Drug use:  No   Sexual activity: Not on file  Other Topics Concern   Not on file  Social History Narrative   Not on file   Social Drivers of Health   Financial Resource Strain: Not on file  Food Insecurity: Food Insecurity Present (10/13/2021)   Received from [facility]   Hunger Vital Sign    Within the past 12 months, you worried that your food would run out before you got the money to buy more.: Sometimes true    Within the past 12 months, the food you bought just didn't last and you didn't have money to get more.: Never true  Transportation Needs: Not on file  Physical Activity: Not on file  Stress: Not on file  Social Connections: Unknown (10/10/2021)   Received [facility]   Social Network    Social Network: Not on file  Intimate Partner Violence: Unknown (10/10/2021)   Received from [facility]   HITS    Physically Hurt: Not on file    Insult or Talk Down To: Not on file    Threaten Physical Harm: Not on file    Scream or Curse: Not on file    Review of Systems   Gen: Denies any fever, chills, fatigue, weight loss, lack of appetite.  CV: Denies chest pain, heart palpitations, peripheral edema, syncope.  Resp: Denies shortness of breath at rest or with exertion. Denies wheezing or cough.  GI: see HPI GU : Denies urinary burning, urinary frequency, urinary hesitancy MS: Denies joint pain, muscle weakness, cramps, or limitation of movement.  Psych: Denies depression, anxiety, memory loss, and confusion Heme: Denies bruising, bleeding, and enlarged lymph nodes.  Physical Exam   BP 135/88 (BP Location: Right Arm, Patient Position: Sitting)   Pulse 73   Temp 97.6 F (36.4 C) (Temporal)   Ht 5' 5.5 (1.664 m)   Wt 180 lb 3.2 oz (81.7 kg)   BMI 29.53 kg/m   General:   Alert and oriented. Pleasant and cooperative. Head:  Normocephalic and atraumatic. Eyes:  Without icterus, sclera clear and conjunctiva pink.  Ears:  Normal auditory acuity. Mouth:  No deformity or  lesions, oral mucosa pink.  Lungs:  Clear to auscultation bilaterally. No wheezes, rales, or rhonchi. No distress.  Heart:  S1, S2 present without murmurs appreciated.  Abdomen:  +BS, soft, non-distended. Ttp  to epigastrium and RUQ. No HSM noted. No guarding or rebound. No masses appreciated.  Rectal:  deferred  Msk:  Symmetrical without gross deformities. Normal posture.. Neurologic:  Alert and  oriented x4;  grossly normal neurologically. Skin:  Intact without significant lesions or rashes. Psych:  Alert and cooperative. Normal mood and affect.  Assessment & Plan   Maria Leblanc is a 68 y.o. female with a history of kidney stones, headache, diabetes, diverticulosis, hypothyroidism presenting today for consultation regarding nausea and vomiting which has recently resolved but also abnormal imaging of the pancreas with possible sidebranch IPMN.    Nausea and vomiting, possible Gastroesophageal reflux disease Chronic nausea and vomiting for over a month which has mostly resolved, possibly related to gastroesophageal reflux disease (GERD). Symptoms include sensation of something in the throat even now and nausea. Differential diagnosis includes GERD, NSAID-induced gastritis, and diabetes-related gastroparesis. Ibuprofen  use may contribute to gastric inflammation and acid production. - Started pantoprazole  40 mg once daily on an empty stomach in the morning. - Will consider upper endoscopy if symptoms do not improve. - Continue Zofran  as needed for nausea.  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 No current symptoms suggestive of malignancy. Monitoring with imaging is recommended to assess for growth or changes. - Recall for MRI in October 2026 to monitor pancreatic lesion. - Monitor for symptoms such as changes in appetite or significant weight  loss.  Chronic constipation Infrequent bowel movements. Stool softeners provide inconsistent relief. No prior use of Miralax . - Recommended starting Miralax  to improve bowel movement frequency.  Colon cancer screening Has not performed colonoscopy and is reluctant to undergo despite conversation about the importance of screening. We did discuss other alternatives such as Cologuard and she has agreed to proceed. We discussed that if positive we would strongly recommend colonoscopy and she indicates her understand. We discuss the risk of false positives and false negatives.  - Cologuard ordered.     Follow up   Follow up 2 months.    Charmaine Melia, MSN, FNP-BC, AGACNP-BC [facility]

## 2024-05-31 ENCOUNTER — Telehealth: Payer: Self-pay | Admitting: Gastroenterology

## 2024-05-31 ENCOUNTER — Ambulatory Visit: Admitting: Gastroenterology

## 2024-05-31 NOTE — Progress Notes (Unsigned)
 "  GI Office Note    Referring Provider: Leavy Waddell NOVAK, FNP Primary Care Physician:  Leavy Waddell NOVAK, FNP Primary Gastroenterologist: Lamar HERO.Rourk, MD  Date:  05/31/2024  ID:  Maria Leblanc, Maria Leblanc November 25, 1955, MRN [id number]   Chief Complaint   No chief complaint on file.  History of Present Illness  Maria Leblanc is a 69 y.o. female with a history of kidney stones, headaches, diabetes, diverticulosis, hypothyroidism, and sidebranch IPMN presenting today with complaint of ***  MRI with and without contrast of the abdomen 02/15/2024 IMPRESSION: - Left adrenal lipid rich adenoma which does not require imaging follow-up. - Pancreatic tail cystic lesion without abnormal enhancement most consistent with side branch IPMN. (1.4cm). Follow-up according to guidelines below. - Few scattered liver and renal simple cysts. - Cholecystectomy with dilation of common bile duct as expected in post cholecystectomy (reservoir phenomenon). No choledocholithiasis. - Optimal imaging surveillance strategies for suspected BD-IPMNs <3 cm and without worrisome features is unclear, but the yearly incidence of transformation to pancreatic cancer is estimated at 0.4-1.1% per year: -Largest cyst less than 1 cm: CT or MRI/MRCP in 2-3 years -Largest cyst 1-2 cm: CT or MRI/MRCP annually for 2 years, then lengthen interval if no change -Largest cyst 2-3 cm: EUS in 6 months, then lengthen interval alternating MRI with EUS as appropriate consider surgery in young patients  Initial office visit 03/30/2024.  Noted issues with persistent nausea and vomiting for month.  Has had multiple episodes of vomiting but nausea is persistent and daily.  Zofran  has helped with vomiting as well as Phenergan suppositories.  Having sensation of something sitting in her throat.  Denies any belching or epigastric pain.  Denies history of acid reflux.  She did report a recent fall in which she had a lot of bruising and soreness but does not  believe this is contributing to any of her GI symptoms.  She reported reluctance in completing the colonoscopy.  Did report infrequent bowel movements and sometimes using a stool softener, not tried MiraLAX .  Did report a lack of appetite and feeling full quickly and stating she is only able to consume a small portion of the kids meal.  Started on pantoprazole  40 mg daily and continue Zofran  as needed for nausea.  Advised may need to schedule upper endoscopy if no improvement in symptoms.  Unable to rule out gastroparesis.  Advised MiraLAX  daily for constipation.  Advised MRI in October 2026 for monitoring of IPMN.  Cologuard ordered at last visit but not completed.  Today:     Wt Readings from Last 6 Encounters:  03/30/24 180 lb 3.2 oz (81.7 kg)  12/24/23 175 lb (79.4 kg)  09/30/21 176 lb (79.8 kg)  05/29/21 179 lb (81.2 kg)  05/09/21 174 lb (78.9 kg)  04/29/21 173 lb 12.8 oz (78.8 kg)    There is no height or weight on file to calculate BMI.   Current Outpatient Medications  Medication Sig Dispense Refill   acetaminophen  (TYLENOL ) 500 MG tablet Take 1 tablet (500 mg total) by mouth every 6 (six) hours as needed. 30 tablet 0   amLODipine  (NORVASC ) 10 MG tablet Take 1 tablet (10 mg total) by mouth daily. 30 tablet 1   dicyclomine  (BENTYL ) 20 MG tablet Take 20 mg by mouth 4 (four) times daily as needed.     empagliflozin (JARDIANCE) 10 MG TABS tablet Take by mouth daily.     gabapentin  (NEURONTIN ) 300 MG capsule Take 2 capsules (600 mg total)  by mouth 3 (three) times daily. 180 capsule 1   glipiZIDE (GLUCOTROL XL) 5 MG 24 hr tablet Take 5 mg by mouth daily.     ibuprofen  (ADVIL ) 200 MG tablet Take 200 mg by mouth every 6 (six) hours as needed.     lidocaine  4 % Place 1 patch onto the skin 2 (two) times daily. 12 patch 0   naproxen  (NAPROSYN ) 500 MG tablet Take 1 tablet (500 mg total) by mouth 2 (two) times daily. 20 tablet 0   nystatin (MYCOSTATIN) 100000 UNIT/ML suspension Take 10 mLs  by mouth 2 (two) times daily.     ondansetron  (ZOFRAN -ODT) 4 MG disintegrating tablet Take 4 mg by mouth every 8 (eight) hours as needed.     pantoprazole  (PROTONIX ) 40 MG tablet Take 1 tablet (40 mg total) by mouth daily. 90 tablet 1   No current facility-administered medications for this visit.    Past Medical History:  Diagnosis Date   Ankle fracture 02/18/2016   left   Borderline diabetes    Complication of anesthesia    fighting with anesthesia when waking up   Diabetes mellitus without complication (HCC)    Diverticulosis    Headache    History of kidney stones    Hypothyroid    no longer needs med    Past Surgical History:  Procedure Laterality Date   CHOLECYSTECTOMY N/A 03/29/2017   Procedure: LAPAROSCOPIC CHOLECYSTECTOMY;  Surgeon: Kallie Manuelita BROCKS, MD;  Location: [facility];  Service: General;  Laterality: N/A;   CYST REMOVAL NECK     x3   DIAGNOSTIC LAPAROSCOPY     as teenager   INCISION AND DRAINAGE PERIRECTAL ABSCESS  03/29/2017   Procedure: INCISION  AND DRAINAGE PERIRECTAL ABSCESS;  Surgeon: Kallie Manuelita BROCKS, MD;  Location: [facility];  Service: General;;   ORIF ANKLE FRACTURE Left 02/28/2016   Procedure: OPEN REDUCTION INTERNAL FIXATION (ORIF) ANKLE FRACTURE, LEFT;  Surgeon: Evalene JONETTA Chancy, MD;  Location: [facility];  Service: Orthopedics;  Laterality: Left;   TONSILLECTOMY     as child    Family History  Problem Relation Age of Onset   Diabetes Mother    Other Mother    Dementia Mother    Cancer Father    Heart attack Father    Diabetes Father    Other Brother     Allergies as of 05/31/2024 - Review Complete 03/30/2024  Allergen Reaction Noted   Propofol  Other (See Comments) 03/28/2017    Social History   Socioeconomic History   Marital status: Single    Spouse name: Not on file   Number of children: Not on file   Years of education: Not on file   Highest education level: Not on file  Occupational History   Not on file   Tobacco Use   Smoking status: Every Day    Current packs/day: 0.50    Average packs/day: 0.5 packs/day for 20.0 years (10.0 ttl pk-yrs)    Types: Cigarettes   Smokeless tobacco: Never  Vaping Use   Vaping status: Never Used  Substance and Sexual Activity   Alcohol use: Not Currently    Comment: rare use of liquor 3 times yearly; denied 07/19/19   Drug use: No   Sexual activity: Not on file  Other Topics Concern   Not on file  Social History Narrative   Not on file   Social Drivers of Health   Tobacco Use: High Risk (03/30/2024)   Patient History  Smoking Tobacco Use: Every Day    Smokeless Tobacco Use: Never    Passive Exposure: Not on file  Financial Resource Strain: Not on file  Food Insecurity: Food Insecurity Present (10/13/2021)   Received from [facility]   Epic    Within the past 12 months, you worried that your food would run out before you got the money to buy more.: Sometimes true    Within the past 12 months, the food you bought just didn't last and you didn't have money to get more.: Never true  Transportation Needs: Not on file  Physical Activity: Not on file  Stress: Not on file  Social Connections: Unknown (10/10/2021)   Received from [facility]   Social Network    Social Network: Not on file  Depression (PHQ2-9): Not on file  Alcohol Screen: Not on file  Housing: Not on file  Utilities: Not on file  Health Literacy: Not on file    Review of Systems   Gen: Denies fever, chills, anorexia. Denies fatigue, weakness, weight loss.  CV: Denies chest pain, palpitations, syncope, peripheral edema, and claudication. Resp: Denies dyspnea at rest, cough, wheezing, coughing up blood, and pleurisy. GI: See HPI Derm: Denies rash, itching, dry skin Psych: Denies depression, anxiety, memory loss, confusion. No homicidal or suicidal ideation.  Heme: Denies bruising, bleeding, and enlarged lymph nodes.  Physical Exam   There were no vitals taken for this  visit.  General:   Alert and oriented. No distress noted. Pleasant and cooperative.  Head:  Normocephalic and atraumatic. Eyes:  Conjuctiva clear without scleral icterus. Mouth:  Oral mucosa pink and moist. Good dentition. No lesions. Lungs:  Clear to auscultation bilaterally. No wheezes, rales, or rhonchi. No distress.  Heart:  S1, S2 present without murmurs appreciated.  Abdomen:  +BS, soft, non-tender and non-distended. No rebound or guarding. No HSM or masses noted. Rectal: *** Msk:  Symmetrical without gross deformities. Normal posture. Extremities:  Without edema. Neurologic:  Alert and  oriented x4 Psych:  Alert and cooperative. Normal mood and affect.  Assessment & Plan  Maria Leblanc is a 69 y.o. female presenting today with ***  EGD?  Follow up   Follow up ***    Charmaine Melia, MSN, FNP-BC, AGACNP-BC [facility] "

## 2024-05-31 NOTE — Telephone Encounter (Signed)
 I NIC'd the MRI/MrCP for Oct. 2026

## 2024-05-31 NOTE — Telephone Encounter (Signed)
 Please recall for MRI/MRCP in October 2026 Dx: IPMN (pancreatic lesion)

## 2024-06-01 ENCOUNTER — Encounter: Payer: Self-pay | Admitting: Gastroenterology
